# Patient Record
Sex: Female | Born: 1995
Health system: Southern US, Community
[De-identification: ages and names within clinical notes are randomized; demographics above are authoritative.]

## PROBLEM LIST (undated history)

## (undated) ENCOUNTER — Inpatient Hospital Stay (HOSPITAL_COMMUNITY): Payer: Self-pay

## (undated) DIAGNOSIS — Z789 Other specified health status: Secondary | ICD-10-CM

## (undated) DIAGNOSIS — A749 Chlamydial infection, unspecified: Secondary | ICD-10-CM

## (undated) DIAGNOSIS — I10 Essential (primary) hypertension: Secondary | ICD-10-CM

## (undated) DIAGNOSIS — O98811 Other maternal infectious and parasitic diseases complicating pregnancy, first trimester: Secondary | ICD-10-CM

## (undated) DIAGNOSIS — F17200 Nicotine dependence, unspecified, uncomplicated: Secondary | ICD-10-CM

## (undated) DIAGNOSIS — A599 Trichomoniasis, unspecified: Secondary | ICD-10-CM

## (undated) DIAGNOSIS — E669 Obesity, unspecified: Secondary | ICD-10-CM

## (undated) HISTORY — PX: WISDOM TOOTH EXTRACTION: SHX21

## (undated) HISTORY — DX: Nicotine dependence, unspecified, uncomplicated: F17.200

## (undated) HISTORY — PX: NO PAST SURGERIES: SHX2092

## (undated) HISTORY — DX: Obesity, unspecified: E66.9

---
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
## 2013-04-03 ENCOUNTER — Emergency Department (HOSPITAL_COMMUNITY): Payer: Medicaid Other

## 2013-04-03 ENCOUNTER — Emergency Department (HOSPITAL_COMMUNITY)
Admission: EM | Admit: 2013-04-03 | Discharge: 2013-04-03 | Disposition: A | Discharge status: Home / Self Care | Payer: Medicaid Other | Attending: Emergency Medicine | Admitting: Emergency Medicine

## 2013-04-03 ENCOUNTER — Encounter (HOSPITAL_COMMUNITY): Payer: Self-pay | Admitting: Emergency Medicine

## 2013-04-03 DIAGNOSIS — Z3202 Encounter for pregnancy test, result negative: Secondary | ICD-10-CM | POA: Insufficient documentation

## 2013-04-03 DIAGNOSIS — F17200 Nicotine dependence, unspecified, uncomplicated: Secondary | ICD-10-CM | POA: Insufficient documentation

## 2013-04-03 DIAGNOSIS — N760 Acute vaginitis: Secondary | ICD-10-CM | POA: Insufficient documentation

## 2013-04-03 DIAGNOSIS — R109 Unspecified abdominal pain: Secondary | ICD-10-CM

## 2013-04-03 DIAGNOSIS — E669 Obesity, unspecified: Secondary | ICD-10-CM | POA: Insufficient documentation

## 2013-04-03 DIAGNOSIS — B9689 Other specified bacterial agents as the cause of diseases classified elsewhere: Secondary | ICD-10-CM | POA: Insufficient documentation

## 2013-04-03 DIAGNOSIS — Z79899 Other long term (current) drug therapy: Secondary | ICD-10-CM | POA: Insufficient documentation

## 2013-04-03 DIAGNOSIS — A499 Bacterial infection, unspecified: Secondary | ICD-10-CM | POA: Insufficient documentation

## 2013-04-03 LAB — WET PREP, GENITAL
Trich, Wet Prep: NONE SEEN
WBC WET PREP: NONE SEEN
Yeast Wet Prep HPF POC: NONE SEEN

## 2013-04-03 LAB — URINALYSIS, ROUTINE W REFLEX MICROSCOPIC
Bilirubin Urine: NEGATIVE
Glucose, UA: NEGATIVE mg/dL
HGB URINE DIPSTICK: NEGATIVE
Ketones, ur: NEGATIVE mg/dL
Leukocytes, UA: NEGATIVE
NITRITE: NEGATIVE
PH: 7 (ref 5.0–8.0)
Protein, ur: NEGATIVE mg/dL
SPECIFIC GRAVITY, URINE: 1.022 (ref 1.005–1.030)
Urobilinogen, UA: 1 mg/dL (ref 0.0–1.0)

## 2013-04-03 LAB — COMPREHENSIVE METABOLIC PANEL
ALK PHOS: 77 U/L (ref 39–117)
ALT: 15 U/L (ref 0–35)
AST: 17 U/L (ref 0–37)
Albumin: 4.1 g/dL (ref 3.5–5.2)
BUN: 12 mg/dL (ref 6–23)
CALCIUM: 9.4 mg/dL (ref 8.4–10.5)
CHLORIDE: 103 meq/L (ref 96–112)
CO2: 25 meq/L (ref 19–32)
Creatinine, Ser: 0.64 mg/dL (ref 0.50–1.10)
GFR calc Af Amer: >90 mL/min (ref >90)
Glucose, Bld: 85 mg/dL (ref 70–99)
POTASSIUM: 4 meq/L (ref 3.7–5.3)
SODIUM: 139 meq/L (ref 137–147)
Total Bilirubin: 0.4 mg/dL (ref 0.3–1.2)
Total Protein: 7.4 g/dL (ref 6.0–8.3)

## 2013-04-03 LAB — CBC WITH DIFFERENTIAL/PLATELET
Basophils Absolute: 0 10*3/uL (ref 0.0–0.1)
Basophils Relative: 0 % (ref 0–1)
EOS PCT: 1 % (ref 0–5)
Eosinophils Absolute: 0.1 10*3/uL (ref 0.0–0.7)
HCT: 39.3 % (ref 36.0–46.0)
Hemoglobin: 13.9 g/dL (ref 12.0–15.0)
LYMPHS ABS: 2 10*3/uL (ref 0.7–4.0)
Lymphocytes Relative: 36 % (ref 12–46)
MCH: 30.2 pg (ref 26.0–34.0)
MCHC: 35.4 g/dL (ref 30.0–36.0)
MCV: 85.2 fL (ref 78.0–100.0)
Monocytes Absolute: 0.4 10*3/uL (ref 0.1–1.0)
Monocytes Relative: 7 % (ref 3–12)
Neutro Abs: 3 10*3/uL (ref 1.7–7.7)
Neutrophils Relative %: 55 % (ref 43–77)
PLATELETS: 247 10*3/uL (ref 150–400)
RBC: 4.61 MIL/uL (ref 3.87–5.11)
RDW: 13.6 % (ref 11.5–15.5)
WBC: 5.4 10*3/uL (ref 4.0–10.5)

## 2013-04-03 LAB — LIPASE, BLOOD: Lipase: 12 U/L (ref 11–59)

## 2013-04-03 LAB — PREGNANCY, URINE: Preg Test, Ur: NEGATIVE

## 2013-04-03 MED ORDER — METRONIDAZOLE 500 MG PO TABS: 500.0000 mg | ORAL_TABLET | Freq: Two times a day (BID) | ORAL | Status: DC

## 2013-04-03 MED ORDER — IBUPROFEN 800 MG PO TABS: 800.0000 mg | ORAL_TABLET | Freq: Three times a day (TID) | ORAL | Status: DC | PRN

## 2013-04-03 NOTE — Discharge Instructions (Signed)
Abdominal Pain, Women °Abdominal (stomach, pelvic, or belly) pain can be caused by many things. It is important to tell your doctor: °· The location of the pain. °· Does it come and go or is it present all the time? °· Are there things that start the pain (eating certain foods, exercise)? °· Are there other symptoms associated with the pain (fever, nausea, vomiting, diarrhea)? °All of this is helpful to know when trying to find the cause of the pain. °CAUSES  °· Stomach: virus or bacteria infection, or ulcer. °· Intestine: appendicitis (inflamed appendix), regional ileitis (Crohn's disease), ulcerative colitis (inflamed colon), irritable bowel syndrome, diverticulitis (inflamed diverticulum of the colon), or cancer of the stomach or intestine. °· Gallbladder disease or stones in the gallbladder. °· Kidney disease, kidney stones, or infection. °· Pancreas infection or cancer. °· Fibromyalgia (pain disorder). °· Diseases of the female organs: °· Uterus: fibroid (non-cancerous) tumors or infection. °· Fallopian tubes: infection or tubal pregnancy. °· Ovary: cysts or tumors. °· Pelvic adhesions (scar tissue). °· Endometriosis (uterus lining tissue growing in the pelvis and on the pelvic organs). °· Pelvic congestion syndrome (female organs filling up with blood just before the menstrual period). °· Pain with the menstrual period. °· Pain with ovulation (producing an egg). °· Pain with an IUD (intrauterine device, birth control) in the uterus. °· Cancer of the female organs. °· Functional pain (pain not caused by a disease, may improve without treatment). °· Psychological pain. °· Depression. °DIAGNOSIS  °Your doctor will decide the seriousness of your pain by doing an examination. °· Blood tests. °· X-rays. °· Ultrasound. °· CT scan (computed tomography, special type of X-ray). °· MRI (magnetic resonance imaging). °· Cultures, for infection. °· Barium enema (dye inserted in the large intestine, to better view it with  X-rays). °· Colonoscopy (looking in intestine with a lighted tube). °· Laparoscopy (minor surgery, looking in abdomen with a lighted tube). °· Major abdominal exploratory surgery (looking in abdomen with a large incision). °TREATMENT  °The treatment will depend on the cause of the pain.  °· Many cases can be observed and treated at home. °· Over-the-counter medicines recommended by your caregiver. °· Prescription medicine. °· Antibiotics, for infection. °· Birth control pills, for painful periods or for ovulation pain. °· Hormone treatment, for endometriosis. °· Nerve blocking injections. °· Physical therapy. °· Antidepressants. °· Counseling with a psychologist or psychiatrist. °· Minor or major surgery. °HOME CARE INSTRUCTIONS  °· Do not take laxatives, unless directed by your caregiver. °· Take over-the-counter pain medicine only if ordered by your caregiver. Do not take aspirin because it can cause an upset stomach or bleeding. °· Try a clear liquid diet (broth or water) as ordered by your caregiver. Slowly move to a bland diet, as tolerated, if the pain is related to the stomach or intestine. °· Have a thermometer and take your temperature several times a day, and record it. °· Bed rest and sleep, if it helps the pain. °· Avoid sexual intercourse, if it causes pain. °· Avoid stressful situations. °· Keep your follow-up appointments and tests, as your caregiver orders. °· If the pain does not go away with medicine or surgery, you may try: °· Acupuncture. °· Relaxation exercises (yoga, meditation). °· Group therapy. °· Counseling. °SEEK MEDICAL CARE IF:  °· You notice certain foods cause stomach pain. °· Your home care treatment is not helping your pain. °· You need stronger pain medicine. °· You want your IUD removed. °· You feel faint or   lightheaded. °· You develop nausea and vomiting. °· You develop a rash. °· You are having side effects or an allergy to your medicine. °SEEK IMMEDIATE MEDICAL CARE IF:  °· Your  pain does not go away or gets worse. °· You have a fever. °· Your pain is felt only in portions of the abdomen. The right side could possibly be appendicitis. The left lower portion of the abdomen could be colitis or diverticulitis. °· You are passing blood in your stools (bright red or black tarry stools, with or without vomiting). °· You have blood in your urine. °· You develop chills, with or without a fever. °· You pass out. °MAKE SURE YOU:  °· Understand these instructions. °· Will watch your condition. °· Will get help right away if you are not doing well or get worse. °Document Released: 10/19/2006 Document Revised: 03/16/2011 Document Reviewed: 11/08/2008 °ExitCare® Patient Information ©2014 [employer]. ° °Bacterial Vaginosis °Bacterial vaginosis is a vaginal infection that occurs when the normal balance of bacteria in the vagina is disrupted. It results from an overgrowth of certain bacteria. This is the most common vaginal infection in women of childbearing age. Treatment is important to prevent complications, especially in pregnant women, as it can cause a premature delivery. °CAUSES  °Bacterial vaginosis is caused by an increase in harmful bacteria that are normally present in smaller amounts in the vagina. Several different kinds of bacteria can cause bacterial vaginosis. However, the reason that the condition develops is not fully understood. °RISK FACTORS °Certain activities or behaviors can put you at an increased risk of developing bacterial vaginosis, including: °· Having a new sex partner or multiple sex partners. °· Douching. °· Using an intrauterine device (IUD) for contraception. °Women do not get bacterial vaginosis from toilet seats, bedding, swimming pools, or contact with objects around them. °SIGNS AND SYMPTOMS  °Some women with bacterial vaginosis have no signs or symptoms. Common symptoms include: °· Grey vaginal discharge. °· A fishlike odor with discharge, especially after sexual  intercourse. °· Itching or burning of the vagina and vulva. °· Burning or pain with urination. °DIAGNOSIS  °Your health care provider will take a medical history and examine the vagina for signs of bacterial vaginosis. A sample of vaginal fluid may be taken. Your health care provider will look at this sample under a microscope to check for bacteria and abnormal cells. A vaginal pH test may also be done.  °TREATMENT  °Bacterial vaginosis may be treated with antibiotic medicines. These may be given in the form of a pill or a vaginal cream. A second round of antibiotics may be prescribed if the condition comes back after treatment.  °HOME CARE INSTRUCTIONS  °· Only take over-the-counter or prescription medicines as directed by your health care provider. °· If antibiotic medicine was prescribed, take it as directed. Make sure you finish it even if you start to feel better. °· Do not have sex until treatment is completed. °· Tell all sexual partners that you have a vaginal infection. They should see their health care provider and be treated if they have problems, such as a mild rash or itching. °· Practice safe sex by using condoms and only having one sex partner. °SEEK MEDICAL CARE IF:  °· Your symptoms are not improving after 3 days of treatment. °· You have increased discharge or pain. °· You have a fever. °MAKE SURE YOU:  °· Understand these instructions. °· Will watch your condition. °· Will get help right away if you   are not doing well or get worse. °FOR MORE INFORMATION  °Centers for Disease Control and Prevention, Division of STD Prevention: www.cdc.gov/std °American Sexual Health Association (ASHA): www.ashastd.org  °Document Released: 12/22/2004 Document Revised: 10/12/2012 Document Reviewed: 08/03/2012 °ExitCare® Patient Information ©2014 [employer]. ° °

## 2013-04-03 NOTE — ED Notes (Signed)
Pt transported to [facility]

## 2013-04-03 NOTE — ED Notes (Signed)
Pt c/o abd pain x 2 wks; increased pain right lower abd; no fever; vomited x 2 yesterday

## 2013-04-03 NOTE — ED Provider Notes (Signed)
TIME SEEN: 3:34 PM  CHIEF COMPLAINT: Abdominal pain, vomiting  HPI: Patient is a 18 year old female with no significant past medical history who presents to the emergency department with complaints of intermittent right-sided lower sharp abdominal pains for the past 2 weeks. She denies any radiation of the pain. It is worse with palpation and better with rest. She had 2 episodes of nonbloody, nonbilious vomiting yesterday. No diarrhea. No bloody stool or melena. No vaginal bleeding or discharge. She is having some dysuria. No urinary frequency or urgency or hematuria. She has never had similar symptoms in the past. She is sexually active with one partner and they occasionally use condoms. Denies a history of STI's. No history of ovarian cysts. No prior history of abdominal surgery. No sick contacts or recent travel, hospitalization or antibiotic use. LMP was 2 weeks ago.  ROS: See HPI Constitutional: no fever  Eyes: no drainage  ENT: no runny nose   Cardiovascular:  no chest pain  Resp: no SOB  GI: no vomiting GU: no dysuria Integumentary: no rash  Allergy: no hives  Musculoskeletal: no leg swelling  Neurological: no slurred speech ROS otherwise negative  PAST MEDICAL HISTORY/PAST SURGICAL HISTORY:  Past Medical History  Diagnosis Date  . Mild obesity   . Smoker     MEDICATIONS:  Prior to Admission medications   Medication Sig Start Date End Date Taking? Authorizing Provider  ibuprofen (ADVIL,MOTRIN) 200 MG tablet Take 400 mg by mouth every 6 (six) hours as needed for moderate pain.   Yes Historical Provider, MD  Prenatal Vit-Fe Fumarate-FA (PRENATAL MULTIVITAMIN) TABS tablet Take 1 tablet by mouth daily at 12 noon.   Yes Historical Provider, MD    ALLERGIES:  No Known Allergies  SOCIAL HISTORY:  History  Substance Use Topics  . Smoking status: Current Every Day Smoker -- 0.50 packs/day    Types: Cigarettes  . Smokeless tobacco: Never Used  . Alcohol Use: Yes    FAMILY  HISTORY: Family History  Problem Relation Age of Onset  . Asthma Brother     EXAM: BP 140/84  Pulse 72  Temp(Src) 98.3 F (36.8 C) (Oral)  Resp 20  SpO2 100%  LMP 03/20/2013 CONSTITUTIONAL: Alert and oriented and responds appropriately to questions. Well-appearing; well-nourished, no apparent distress HEAD: Normocephalic EYES: Conjunctivae clear, PERRL ENT: normal nose; no rhinorrhea; moist mucous membranes; pharynx without lesions noted NECK: Supple, no meningismus, no LAD  CARD: RRR; S1 and S2 appreciated; no murmurs, no clicks, no rubs, no gallops RESP: Normal chest excursion without splinting or tachypnea; breath sounds clear and equal bilaterally; no wheezes, no rhonchi, no rales,  ABD/GI: Normal bowel sounds; non-distended; soft, tender to palpation in the right pelvic region with mild voluntary guarding, no rebound or peritoneal signs, negative Murphy sign, patient is not tender at McBurney's point GU:  Normal external genitalia, cervix is thick and closed and high, minimal white vaginal discharge, patient has right adnexal tenderness without fullness and no mass, no cervical motion tenderness, left adnexa is nontender to palpation; no vaginal bleeding BACK:  The back appears normal and is non-tender to palpation, there is no CVA tenderness EXT: Normal ROM in all joints; non-tender to palpation; no edema; normal capillary refill; no cyanosis    SKIN: Normal color for age and race; warm NEURO: Moves all extremities equally PSYCH: The patient's mood and manner are appropriate. Grooming and personal hygiene are appropriate.  MEDICAL DECISION MAKING: Patient here with right-sided pelvic pain. Suspect ovarian cyst versus ectopic,  torsion, TOA. Will obtain labs, urine, pelvic with cultures and transvaginal ultrasound. We'll give pain and nausea medication. I am not concerned for appendicitis given patient has not had any fevers and she has had pain for 2 weeks  ED PROGRESS: Patient's  labs, urine are unremarkable. Pregnancy test negative. Her wet prep is positive for clue cells. We'll treat her bacterial vaginosis with Flagyl. Her ultrasound shows normal Doppler flow to both ovaries with no abnormality. Upon reassessment of the patient she is in the room laughing and asking for something to eat. Her friend is now at her bedside was also a patient that was checked in earlier today. Patient is requesting a work note for today and yesterday. Have discussed return precautions with patient and supportive care instructions. Patient verbalizes understanding and is comfortable with plan.     Layla MawKristen N Ward, DO 04/03/13 [phone]

## 2013-04-04 LAB — GC/CHLAMYDIA PROBE AMP
CT Probe RNA: NEGATIVE
GC PROBE AMP APTIMA: NEGATIVE

## 2013-04-06 ENCOUNTER — Encounter (HOSPITAL_COMMUNITY): Payer: Self-pay | Admitting: Emergency Medicine

## 2013-04-06 ENCOUNTER — Emergency Department (HOSPITAL_COMMUNITY)
Admission: EM | Admit: 2013-04-06 | Discharge: 2013-04-06 | Disposition: A | Discharge status: Home / Self Care | Payer: Medicaid Other | Attending: Emergency Medicine | Admitting: Emergency Medicine

## 2013-04-06 DIAGNOSIS — Z8619 Personal history of other infectious and parasitic diseases: Secondary | ICD-10-CM | POA: Insufficient documentation

## 2013-04-06 DIAGNOSIS — Z79899 Other long term (current) drug therapy: Secondary | ICD-10-CM | POA: Insufficient documentation

## 2013-04-06 DIAGNOSIS — Z760 Encounter for issue of repeat prescription: Secondary | ICD-10-CM | POA: Insufficient documentation

## 2013-04-06 DIAGNOSIS — R109 Unspecified abdominal pain: Secondary | ICD-10-CM | POA: Insufficient documentation

## 2013-04-06 DIAGNOSIS — Z8742 Personal history of other diseases of the female genital tract: Secondary | ICD-10-CM | POA: Insufficient documentation

## 2013-04-06 DIAGNOSIS — F17200 Nicotine dependence, unspecified, uncomplicated: Secondary | ICD-10-CM | POA: Insufficient documentation

## 2013-04-06 MED ORDER — IBUPROFEN 800 MG PO TABS
800.0000 mg | ORAL_TABLET | Freq: Once | ORAL | Status: AC
  Administered 2013-04-06: 800 mg via ORAL
  Filled 2013-04-06: qty 1

## 2013-04-06 MED ORDER — METRONIDAZOLE 500 MG PO TABS
500.0000 mg | ORAL_TABLET | Freq: Once | ORAL | Status: AC
  Administered 2013-04-06: 500 mg via ORAL
  Filled 2013-04-06: qty 1

## 2013-04-06 NOTE — ED Notes (Signed)
Pt asking for work note to be out of work for 2 more days.  Pt was dx with bacterial vaginitis recently.

## 2013-04-06 NOTE — Progress Notes (Signed)
[facility] provided pt with a list of primary care resources to help patient establish primary care.  °

## 2013-04-06 NOTE — ED Provider Notes (Signed)
CSN: [id number]     Arrival date & time 04/06/13  1433 History  This chart was scribed for non-physician practitioner Roxy Horseman, PA-C, working with Juliet Rude. Rubin Payor, MD, by Yevette Edwards, ED Scribe. This patient was seen in room WTR6/WTR6 and the patient's care was started at 4:06 PM.   First MD Initiated Contact with Patient 04/06/13 1456     Chief Complaint  Patient presents with  . work note     The history is provided by the patient. No language interpreter was used.   HPI Comments: Patricia Warren is a 18 y.o. female who presents to the Emergency Department with the chief complaint of abdominal pain.  Two days ago, she was diagnosed with bacterial vaginitis after experiencing intermittent abdominal pain for two weeks.  She cannot fill the prescriptions prescribed two days ago until tomorrow due to finances. She has continued to experience abdominal pain; the pain has been waxing and waning but remains similar to the pain she has experienced for two weeks. Yesterday evening she experienced an episode of non-bloody non-bilious diarrhea; she also experienced an episode of diarrhea this morning. She denies vaginal discharge and a fever. In the ED her temperature is 98.4 F.   Past Medical History  Diagnosis Date  . Mild obesity   . Smoker    History reviewed. No pertinent past surgical history. Family History  Problem Relation Age of Onset  . Asthma Brother    History  Substance Use Topics  . Smoking status: Current Every Day Smoker -- 0.50 packs/day    Types: Cigarettes  . Smokeless tobacco: Never Used  . Alcohol Use: Yes   No OB history provided.  Review of Systems  Constitutional: Negative for fever and chills.  Respiratory: Negative for shortness of breath.   Cardiovascular: Negative for chest pain.  Gastrointestinal: Negative for nausea, vomiting, diarrhea and constipation.  Genitourinary: Negative for dysuria.      Allergies  Review of patient's allergies  indicates no known allergies.  Home Medications   Current Outpatient Rx  Name  Route  Sig  Dispense  Refill  . ibuprofen (ADVIL,MOTRIN) 200 MG tablet   Oral   Take 400 mg by mouth every 6 (six) hours as needed for moderate pain.         . Prenatal Vit-Fe Fumarate-FA (PRENATAL MULTIVITAMIN) TABS tablet   Oral   Take 1 tablet by mouth daily at 12 noon.         Marland Kitchen ibuprofen (ADVIL,MOTRIN) 800 MG tablet   Oral   Take 1 tablet (800 mg total) by mouth every 8 (eight) hours as needed (pain).   30 tablet   0   . metroNIDAZOLE (FLAGYL) 500 MG tablet   Oral   Take 1 tablet (500 mg total) by mouth 2 (two) times daily. Do not drink alcohol with this medication.   14 tablet   0    Triage Vitals: BP 121/74  Pulse 90  Temp(Src) 98.4 F (36.9 C) (Oral)  Resp 18  SpO2 97%  LMP 03/20/2013  Physical Exam  Nursing note and vitals reviewed. Constitutional: She is oriented to person, place, and time. She appears well-developed and well-nourished. No distress.  HENT:  Head: Normocephalic and atraumatic.  Eyes: EOM are normal.  Neck: Neck supple. No tracheal deviation present.  Cardiovascular: Normal rate and regular rhythm.   Pulmonary/Chest: Effort normal and breath sounds normal. No respiratory distress.  Abdominal: Soft. Bowel sounds are normal. She exhibits no distension and no  mass. There is no tenderness. There is no rebound and no guarding.  No focal abdominal tenderness, no RLQ tenderness or pain at McBurney's point, no RUQ tenderness or Murphy's sign, no left-sided abdominal tenderness, no fluid wave, or signs of peritonitis   Musculoskeletal: Normal range of motion.  Neurological: She is alert and oriented to person, place, and time.  Skin: Skin is warm and dry.  Psychiatric: She has a normal mood and affect. Her behavior is normal.    ED Course  Procedures (including critical care time)  DIAGNOSTIC STUDIES: Oxygen Saturation is 97% on room air, normal by my  interpretation.    COORDINATION OF CARE:  4:12 PM- Discussed treatment plan with patient, and the patient agreed to the plan. The plan includes medication. Informed pt to return to ED if she develops a high fever, hematemesis, or hematochezia.   Labs Review Labs Reviewed - No data to display Imaging Review No results found.   EKG Interpretation None      MDM   Final diagnoses:  Medication refill    Patient was seen here 2 days ago for the same complaint.  She was diagnosed with BV.  Still having symptoms.  She is requesting a work note because her symptoms have not resolved.  I told her that she needs to fill her medicine.  I will give her a dose here, because she says she can't fill the medicine until tomorrow.  Will give her a work note for today.  Discharge to home in good condition.  NAD.  Abdomen is benign.  I doubt acute or surgical abdomen.  Filed Vitals:   04/06/13 1448  BP: 121/74  Pulse: 90  Temp: 98.4 F (36.9 C)  Resp: 18   I personally performed the services described in this documentation, which was scribed in my presence. The recorded information has been reviewed and is accurate.      Roxy Horsemanobert Hadyn Blanck, PA-C 04/06/13 1621

## 2013-04-06 NOTE — Discharge Instructions (Signed)
Medication Refill, Emergency Department  We have refilled your medication today as a courtesy to you. It is best for your medical care, however, to take care of getting refills done through your primary caregiver's office. They have your records and can do a better job of follow-up than we can in the emergency department.  On maintenance medications, we often only prescribe enough medications to get you by until you are able to see your regular caregiver. This is a more expensive way to refill medications.  In the future, please plan for refills so that you will not have to use the emergency department for this.  Thank you for your help. Your help allows us to better take care of the daily emergencies that enter our department.  Document Released: 04/10/2003 Document Revised: 03/16/2011 Document Reviewed: 12/22/2004  [employer]® Patient Information ©2014 [employer].

## 2013-04-07 NOTE — ED Provider Notes (Signed)
Medical screening examination/treatment/procedure(s) were performed by non-physician practitioner and as supervising physician I was immediately available for consultation/collaboration.     Emmy Keng M Brison Fiumara, MD 04/07/13 1403 

## 2013-09-02 ENCOUNTER — Encounter (HOSPITAL_COMMUNITY): Payer: Self-pay | Admitting: Emergency Medicine

## 2013-09-02 ENCOUNTER — Emergency Department (HOSPITAL_COMMUNITY)
Admission: EM | Admit: 2013-09-02 | Discharge: 2013-09-02 | Disposition: A | Discharge status: Home / Self Care | Payer: Medicaid Other | Attending: Emergency Medicine | Admitting: Emergency Medicine

## 2013-09-02 DIAGNOSIS — E669 Obesity, unspecified: Secondary | ICD-10-CM | POA: Insufficient documentation

## 2013-09-02 DIAGNOSIS — F17200 Nicotine dependence, unspecified, uncomplicated: Secondary | ICD-10-CM | POA: Insufficient documentation

## 2013-09-02 DIAGNOSIS — R339 Retention of urine, unspecified: Secondary | ICD-10-CM | POA: Insufficient documentation

## 2013-09-02 DIAGNOSIS — M549 Dorsalgia, unspecified: Secondary | ICD-10-CM | POA: Insufficient documentation

## 2013-09-02 DIAGNOSIS — R350 Frequency of micturition: Secondary | ICD-10-CM | POA: Insufficient documentation

## 2013-09-02 DIAGNOSIS — R300 Dysuria: Secondary | ICD-10-CM

## 2013-09-02 LAB — URINALYSIS, ROUTINE W REFLEX MICROSCOPIC
Bilirubin Urine: NEGATIVE
Glucose, UA: NEGATIVE mg/dL
Hgb urine dipstick: NEGATIVE
Ketones, ur: NEGATIVE mg/dL
NITRITE: NEGATIVE
PROTEIN: NEGATIVE mg/dL
SPECIFIC GRAVITY, URINE: 1.027 (ref 1.005–1.030)
Urobilinogen, UA: 1 mg/dL (ref 0.0–1.0)
pH: 7 (ref 5.0–8.0)

## 2013-09-02 LAB — WET PREP, GENITAL
CLUE CELLS WET PREP: NONE SEEN
Trich, Wet Prep: NONE SEEN
WBC, Wet Prep HPF POC: NONE SEEN
Yeast Wet Prep HPF POC: NONE SEEN

## 2013-09-02 LAB — CBC WITH DIFFERENTIAL/PLATELET
BASOS ABS: 0 10*3/uL (ref 0.0–0.1)
BASOS PCT: 0 % (ref 0–1)
EOS ABS: 0.1 10*3/uL (ref 0.0–0.7)
EOS PCT: 2 % (ref 0–5)
HEMATOCRIT: 37.2 % (ref 36.0–46.0)
Hemoglobin: 12.8 g/dL (ref 12.0–15.0)
LYMPHS PCT: 34 % (ref 12–46)
Lymphs Abs: 2 10*3/uL (ref 0.7–4.0)
MCH: 30 pg (ref 26.0–34.0)
MCHC: 34.4 g/dL (ref 30.0–36.0)
MCV: 87.3 fL (ref 78.0–100.0)
MONO ABS: 0.4 10*3/uL (ref 0.1–1.0)
Monocytes Relative: 7 % (ref 3–12)
Neutro Abs: 3.3 10*3/uL (ref 1.7–7.7)
Neutrophils Relative %: 57 % (ref 43–77)
PLATELETS: 218 10*3/uL (ref 150–400)
RBC: 4.26 MIL/uL (ref 3.87–5.11)
RDW: 14 % (ref 11.5–15.5)
WBC: 5.8 10*3/uL (ref 4.0–10.5)

## 2013-09-02 LAB — BASIC METABOLIC PANEL
Anion gap: 13 (ref 5–15)
BUN: 13 mg/dL (ref 6–23)
CALCIUM: 9 mg/dL (ref 8.4–10.5)
CO2: 22 meq/L (ref 19–32)
CREATININE: 0.74 mg/dL (ref 0.50–1.10)
Chloride: 104 mEq/L (ref 96–112)
GFR calc non Af Amer: >90 mL/min (ref >90)
Glucose, Bld: 97 mg/dL (ref 70–99)
Potassium: 4.1 mEq/L (ref 3.7–5.3)
SODIUM: 139 meq/L (ref 137–147)

## 2013-09-02 LAB — URINE MICROSCOPIC-ADD ON

## 2013-09-02 MED ORDER — PHENAZOPYRIDINE HCL 95 MG PO TABS: 95.0000 mg | ORAL_TABLET | Freq: Three times a day (TID) | ORAL | Status: DC | PRN

## 2013-09-02 MED ORDER — SODIUM CHLORIDE 0.9 % IV BOLUS (SEPSIS)
1000.0000 mL | Freq: Once | INTRAVENOUS | Status: AC
  Administered 2013-09-02: 1000 mL via INTRAVENOUS

## 2013-09-02 NOTE — ED Provider Notes (Signed)
CSN: [id number]     Arrival date & time 09/02/13  1358 History   First MD Initiated Contact with Patient 09/02/13 1548     Chief Complaint  Patient presents with  . Urinary Retention  . Back Pain     (Consider location/radiation/quality/duration/timing/severity/associated sxs/prior Treatment) HPI Comments: Patient presents to the ED with a chief complaint of urinary frequency, and dysuria.  She states that she has been having the symptoms for the past week.  She describes the pain as sharp.  She states that she has had a bladder infection in the past and this feel similar.  She reports associated cramps and low-back pain.  She denies any fevers, chills, nausea, vomiting, or vaginal discharge.  She denies bowel or bladder incontinence.  She states that it feels like the volume of urine that she produces with each attempt is decreasing and the pain is worsening.  Additionally she states that she has had a sore throat since last night.  She has not taken anything to alleviate her symptoms.  There are no aggravating or alleviating factors.  The history is provided by the patient. No language interpreter was used.    Past Medical History  Diagnosis Date  . Mild obesity   . Smoker    History reviewed. No pertinent past surgical history. Family History  Problem Relation Age of Onset  . Asthma Brother    History  Substance Use Topics  . Smoking status: Current Every Day Smoker -- 0.50 packs/day    Types: Cigarettes  . Smokeless tobacco: Never Used  . Alcohol Use: Yes   OB History   Grav Para Term Preterm Abortions TAB SAB Ect Mult Living                 Review of Systems  Constitutional: Negative for fever and chills.  Gastrointestinal: Negative for nausea, vomiting, abdominal pain and diarrhea.  Genitourinary: Positive for dysuria, frequency and decreased urine volume. Negative for vaginal bleeding and vaginal discharge.  Musculoskeletal: Positive for back pain.  All other systems  reviewed and are negative.     Allergies  Review of patient's allergies indicates no known allergies.  Home Medications   Prior to Admission medications   Medication Sig Start Date End Date Taking? Authorizing Provider  ibuprofen (ADVIL,MOTRIN) 200 MG tablet Take 400 mg by mouth every 6 (six) hours as needed for moderate pain.   Yes Historical Provider, MD   BP 112/61  Pulse 74  Temp(Src) 98.5 F (36.9 C) (Oral)  Resp 17  SpO2 100%  LMP 08/31/2013 Physical Exam  Nursing note and vitals reviewed. Constitutional: She is oriented to person, place, and time. She appears well-developed and well-nourished.  HENT:  Head: Normocephalic and atraumatic.  Eyes: Conjunctivae and EOM are normal. Pupils are equal, round, and reactive to light.  Neck: Normal range of motion. Neck supple.  Cardiovascular: Normal rate and regular rhythm.  Exam reveals no gallop and no friction rub.   No murmur heard. Pulmonary/Chest: Effort normal and breath sounds normal. No respiratory distress. She has no wheezes. She has no rales. She exhibits no tenderness.  Abdominal: Soft. She exhibits no distension and no mass. There is no tenderness. There is no rebound and no guarding.  No focal abdominal tenderness, no RLQ tenderness or pain at McBurney's point, no RUQ tenderness or Murphy's sign, no left-sided abdominal tenderness, no fluid wave, or signs of peritonitis   Genitourinary:  Pelvic exam chaperoned by female ER tech, no right or  left adnexal tenderness, no uterine tenderness, no vaginal discharge, no bleeding, no CMT or friability, no foreign body, no injury to the external genitalia, no other significant findings    Musculoskeletal: Normal range of motion. She exhibits no edema and no tenderness.  Lumbar spine ttp, no CVA tenderness  Neurological: She is alert and oriented to person, place, and time.  Normal LE reflexes, sensation and strength of LEs is 5/5  Skin: Skin is warm and dry.  Psychiatric:  She has a normal mood and affect. Her behavior is normal. Judgment and thought content normal.    ED Course  Procedures (including critical care time) Results for orders placed during the hospital encounter of 09/02/13  WET PREP, GENITAL      Result Value Ref Range   Yeast Wet Prep HPF POC NONE SEEN  NONE SEEN   Trich, Wet Prep NONE SEEN  NONE SEEN   Clue Cells Wet Prep HPF POC NONE SEEN  NONE SEEN   WBC, Wet Prep HPF POC NONE SEEN  NONE SEEN  URINALYSIS, ROUTINE W REFLEX MICROSCOPIC      Result Value Ref Range   Color, Urine YELLOW  YELLOW   APPearance CLOUDY (*) CLEAR   Specific Gravity, Urine 1.027  1.005 - 1.030   pH 7.0  5.0 - 8.0   Glucose, UA NEGATIVE  NEGATIVE mg/dL   Hgb urine dipstick NEGATIVE  NEGATIVE   Bilirubin Urine NEGATIVE  NEGATIVE   Ketones, ur NEGATIVE  NEGATIVE mg/dL   Protein, ur NEGATIVE  NEGATIVE mg/dL   Urobilinogen, UA 1.0  0.0 - 1.0 mg/dL   Nitrite NEGATIVE  NEGATIVE   Leukocytes, UA SMALL (*) NEGATIVE  CBC WITH DIFFERENTIAL      Result Value Ref Range   WBC 5.8  4.0 - 10.5 K/uL   RBC 4.26  3.87 - 5.11 MIL/uL   Hemoglobin 12.8  12.0 - 15.0 g/dL   HCT 47.8  29.5 - 62.1 %   MCV 87.3  78.0 - 100.0 fL   MCH 30.0  26.0 - 34.0 pg   MCHC 34.4  30.0 - 36.0 g/dL   RDW 30.8  65.7 - 84.6 %   Platelets 218  150 - 400 K/uL   Neutrophils Relative % 57  43 - 77 %   Neutro Abs 3.3  1.7 - 7.7 K/uL   Lymphocytes Relative 34  12 - 46 %   Lymphs Abs 2.0  0.7 - 4.0 K/uL   Monocytes Relative 7  3 - 12 %   Monocytes Absolute 0.4  0.1 - 1.0 K/uL   Eosinophils Relative 2  0 - 5 %   Eosinophils Absolute 0.1  0.0 - 0.7 K/uL   Basophils Relative 0  0 - 1 %   Basophils Absolute 0.0  0.0 - 0.1 K/uL  BASIC METABOLIC PANEL      Result Value Ref Range   Sodium 139  137 - 147 mEq/L   Potassium 4.1  3.7 - 5.3 mEq/L   Chloride 104  96 - 112 mEq/L   CO2 22  19 - 32 mEq/L   Glucose, Bld 97  70 - 99 mg/dL   BUN 13  6 - 23 mg/dL   Creatinine, Ser 9.62  0.50 - 1.10 mg/dL    Calcium 9.0  8.4 - 95.2 mg/dL   GFR calc non Af Amer >90  >90 mL/min   GFR calc Af Amer >90  >90 mL/min   Anion gap 13  5 -  15  URINE MICROSCOPIC-ADD ON      Result Value Ref Range   Squamous Epithelial / LPF MANY (*) RARE   WBC, UA 11-20  <3 WBC/hpf   Bacteria, UA FEW (*) RARE   Urine-Other MUCOUS PRESENT     No results found.    EKG Interpretation None      MDM   Final diagnoses:  Dysuria    Patient with urinary frequency and dysuria.  Also complains of low-back pain.  Check labs and UA.  Will also check bladder scan pre and post-void to rule out retention.   Patient with urinary frequency and dysuria. UA is unremarkable. Will perform pelvic exam. Patient discussed with Dr. Freida Busman.  Patient is not retaining urine. Postvoid residual is 68 mL.  Pelvic exam unremarkable. Patient states that palpation of uterus makes her want to urinate.  Discussed the patient with Dr. Freida Busman, no open sores, no obvious abnormality on pelvic, no evidence of infection, will treat with pyridium.  DC to home with PCP follow-up.  Roxy Horseman, PA-C 09/02/13 2025

## 2013-09-02 NOTE — ED Notes (Addendum)
Pt from home c/o difficulty with urination x1 week. Pt reports a hx of the same and was dx with UTI. Pt denies dysuria, but sts she is having 'bladder cramps". Pt denies N/V, fever. Pt sts that she has had unprotected sex.  Pt adds that she has a sore throat that started this am. Pt is A&O and in NAD

## 2013-09-02 NOTE — ED Notes (Signed)
Pelvic supplies at bedside. Pt undressed waist down.

## 2013-09-02 NOTE — Discharge Instructions (Signed)

## 2013-09-04 LAB — POC URINE PREG, ED: Preg Test, Ur: NEGATIVE

## 2013-09-05 LAB — GC/CHLAMYDIA PROBE AMP
CT PROBE, AMP APTIMA: NEGATIVE
GC Probe RNA: NEGATIVE

## 2013-09-05 NOTE — ED Provider Notes (Signed)
Medical screening examination/treatment/procedure(s) were performed by non-physician practitioner and as supervising physician I was immediately available for consultation/collaboration.  Nashaly Dorantes T Nairi Oswald, MD 09/05/13 1725 

## 2013-10-09 ENCOUNTER — Emergency Department (HOSPITAL_COMMUNITY)
Admission: EM | Admit: 2013-10-09 | Discharge: 2013-10-09 | Disposition: A | Discharge status: Home / Self Care | Payer: Medicaid Other | Attending: Emergency Medicine | Admitting: Emergency Medicine

## 2013-10-09 ENCOUNTER — Encounter (HOSPITAL_COMMUNITY): Payer: Self-pay | Admitting: Emergency Medicine

## 2013-10-09 DIAGNOSIS — S300XXA Contusion of lower back and pelvis, initial encounter: Secondary | ICD-10-CM | POA: Insufficient documentation

## 2013-10-09 DIAGNOSIS — Z720 Tobacco use: Secondary | ICD-10-CM | POA: Insufficient documentation

## 2013-10-09 DIAGNOSIS — S301XXA Contusion of abdominal wall, initial encounter: Secondary | ICD-10-CM | POA: Insufficient documentation

## 2013-10-09 DIAGNOSIS — E669 Obesity, unspecified: Secondary | ICD-10-CM | POA: Insufficient documentation

## 2013-10-09 MED ORDER — HYDROCODONE-ACETAMINOPHEN 5-325 MG PO TABS
1.0000 | ORAL_TABLET | Freq: Once | ORAL | Status: AC
  Administered 2013-10-09: 1 via ORAL
  Filled 2013-10-09: qty 1

## 2013-10-09 MED ORDER — IBUPROFEN 400 MG PO TABS
400.0000 mg | ORAL_TABLET | Freq: Once | ORAL | Status: AC
  Administered 2013-10-09: 400 mg via ORAL
  Filled 2013-10-09: qty 1

## 2013-10-09 MED ORDER — TRAMADOL HCL 50 MG PO TABS: 50.0000 mg | ORAL_TABLET | Freq: Four times a day (QID) | ORAL | Status: DC | PRN

## 2013-10-09 NOTE — Discharge Instructions (Signed)
It was our pleasure to provide your ER care today - we hope that you feel better.  Take motrin or aleve as need for pain. You may also try ultram as need for pain - no driving when taking ultram. Follow up with primary care doctor in 1 week if symptoms fail to improve/resolve.  You were given pain medication in the ER - no driving for the next 4 hours.  Return to ER if worse, new symptoms, severe pain, other concern.     Contusion A contusion is a deep bruise. Contusions happen when an injury causes bleeding under the skin. Signs of bruising include pain, puffiness (swelling), and discolored skin. The contusion may turn blue, purple, or yellow. HOME CARE   Put ice on the injured area.  Put ice in a plastic bag.  Place a towel between your skin and the bag.  Leave the ice on for 15-20 minutes, 03-04 times a day.  Only take medicine as told by your doctor.  Rest the injured area.  If possible, raise (elevate) the injured area to lessen puffiness. GET HELP RIGHT AWAY IF:   You have more bruising or puffiness.  You have pain that is getting worse.  Your puffiness or pain is not helped by medicine. MAKE SURE YOU:   Understand these instructions.  Will watch your condition.  Will get help right away if you are not doing well or get worse. Document Released: 06/10/2007 Document Revised: 03/16/2011 Document Reviewed: 10/27/2010 [facility] Patient Information 2015 [place], [place]. This information is not intended to replace advice given to you by your health care provider. Make sure you discuss any questions you have with your health care provider.     Assault, General Assault includes any behavior, whether intentional or reckless, which results in bodily injury to another person and/or damage to property. Included in this would be any behavior, intentional or reckless, that by its nature would be understood (interpreted) by a reasonable person as intent to harm another person  or to damage his/her property. Threats may be oral or written. They may be communicated through regular mail, computer, fax, or phone. These threats may be direct or implied. FORMS OF ASSAULT INCLUDE:  Physically assaulting a person. This includes physical threats to inflict physical harm as well as:  Slapping.  Hitting.  Poking.  Kicking.  Punching.  Pushing.  Arson.  Sabotage.  Equipment vandalism.  Damaging or destroying property.  Throwing or hitting objects.  Displaying a weapon or an object that appears to be a weapon in a threatening manner.  Carrying a firearm of any kind.  Using a weapon to harm someone.  Using greater physical size/strength to intimidate another.  Making intimidating or threatening gestures.  Bullying.  Hazing.  Intimidating, threatening, hostile, or abusive language directed toward another person.  It communicates the intention to engage in violence against that person. And it leads a reasonable person to expect that violent behavior may occur.  Stalking another person. IF IT HAPPENS AGAIN:  Immediately call for emergency help (911 in [place]  If someone poses clear and immediate danger to you, seek legal authorities to have a protective or restraining order put in place.  Less threatening assaults can at least be reported to authorities. STEPS TO TAKE IF A SEXUAL ASSAULT HAS HAPPENED  Go to an area of safety. This may include a shelter or staying with a friend. Stay away from the area where you have been attacked. A large percentage of sexual  assaults are caused by a friend, relative or associate.  If medications were given by your caregiver, take them as directed for the full length of time prescribed.  Only take over-the-counter or prescription medicines for pain, discomfort, or fever as directed by your caregiver.  If you have come in contact with a sexual disease, find out if you are to be tested again. If your caregiver is  concerned about the HIV/AIDS virus, he/she may require you to have continued testing for several months.  For the protection of your privacy, test results can not be given over the phone. Make sure you receive the results of your test. If your test results are not back during your visit, make an appointment with your caregiver to find out the results. Do not assume everything is normal if you have not heard from your caregiver or the medical facility. It is important for you to follow up on all of your test results.  File appropriate papers with authorities. This is important in all assaults, even if it has occurred in a family or by a friend. SEEK MEDICAL CARE IF:  You have new problems because of your injuries.  You have problems that may be because of the medicine you are taking, such as:  Rash.  Itching.  Swelling.  Trouble breathing.  You develop belly (abdominal) pain, feel sick to your stomach (nausea) or are vomiting.  You begin to run a temperature.  You need supportive care or referral to a rape crisis center. These are centers with trained personnel who can help you get through this ordeal. SEEK IMMEDIATE MEDICAL CARE IF:  You are afraid of being threatened, beaten, or abused. In [place] call 911.  You receive new injuries related to abuse.  You develop severe pain in any area injured in the assault or have any change in your condition that concerns you.  You faint or lose consciousness.  You develop chest pain or shortness of breath. Document Released: 12/22/2004 Document Revised: 03/16/2011 Document Reviewed: 08/10/2007 [facility] Patient Information 2015 [place], [place]. This information is not intended to replace advice given to you by your health care provider. Make sure you discuss any questions you have with your health care provider.     Blunt Trauma You have been evaluated for injuries. You have been examined and your caregiver has not found injuries serious  enough to require hospitalization. It is common to have multiple bruises and sore muscles following an accident. These tend to feel worse for the first 24 hours. You will feel more stiffness and soreness over the next several hours and worse when you wake up the first morning after your accident. After this point, you should begin to improve with each passing day. The amount of improvement depends on the amount of damage done in the accident. Following your accident, if some part of your body does not work as it should, or if the pain in any area continues to increase, you should return to the Emergency Department for re-evaluation.  HOME CARE INSTRUCTIONS  Routine care for sore areas should include:  Ice to sore areas every 2 hours for 20 minutes while awake for the next 2 days.  Drink extra fluids (not alcohol).  Take a hot or warm shower or bath once or twice a day to increase blood flow to sore muscles. This will help you "limber up".  Activity as tolerated. Lifting may aggravate neck or back pain.  Only take over-the-counter or prescription medicines for  pain, discomfort, or fever as directed by your caregiver. Do not use aspirin. This may increase bruising or increase bleeding if there are small areas where this is happening. SEEK IMMEDIATE MEDICAL CARE IF:  Numbness, tingling, weakness, or problem with the use of your arms or legs.  A severe headache is not relieved with medications.  There is a change in bowel or bladder control.  Increasing pain in any areas of the body.  Short of breath or dizzy.  Nauseated, vomiting, or sweating.  Increasing belly (abdominal) discomfort.  Blood in urine, stool, or vomiting blood.  Pain in either shoulder in an area where a shoulder strap would be.  Feelings of lightheadedness or if you have a fainting episode. Sometimes it is not possible to identify all injuries immediately after the trauma. It is important that you continue to monitor  your condition after the emergency department visit. If you feel you are not improving, or improving more slowly than should be expected, call your physician. If you feel your symptoms (problems) are worsening, return to the Emergency Department immediately. Document Released: 09/17/2000 Document Revised: 03/16/2011 Document Reviewed: 08/10/2007 [facility] Patient Information 2015 [place], [place]. This information is not intended to replace advice given to you by your health care provider. Make sure you discuss any questions you have with your health care provider.   Back Pain, Adult Low back pain is very common. About 1 in 5 people have back pain.The cause of low back pain is rarely dangerous. The pain often gets better over time.About half of people with a sudden onset of back pain feel better in just 2 weeks. About 8 in 10 people feel better by 6 weeks.  CAUSES Some common causes of back pain include:  Strain of the muscles or ligaments supporting the spine.  Wear and tear (degeneration) of the spinal discs.  Arthritis.  Direct injury to the back. DIAGNOSIS Most of the time, the direct cause of low back pain is not known.However, back pain can be treated effectively even when the exact cause of the pain is unknown.Answering your caregiver's questions about your overall health and symptoms is one of the most accurate ways to make sure the cause of your pain is not dangerous. If your caregiver needs more information, he or she may order lab work or imaging tests (X-rays or MRIs).However, even if imaging tests show changes in your back, this usually does not require surgery. HOME CARE INSTRUCTIONS For many people, back pain returns.Since low back pain is rarely dangerous, it is often a condition that people can learn to [facility] their own.   Remain active. It is stressful on the back to sit or stand in one place. Do not sit, drive, or stand in one place for more than 30 minutes at a time.  Take short walks on level surfaces as soon as pain allows.Try to increase the length of time you walk each day.  Do not stay in bed.Resting more than 1 or 2 days can delay your recovery.  Do not avoid exercise or work.Your body is made to move.It is not dangerous to be active, even though your back may hurt.Your back will likely heal faster if you return to being active before your pain is gone.  Pay attention to your body when you bend and lift. Many people have less discomfortwhen lifting if they bend their knees, keep the load close to their bodies,and avoid twisting. Often, the most comfortable positions are those that put less stress  on your recovering back.  Find a comfortable position to sleep. Use a firm mattress and lie on your side with your knees slightly bent. If you lie on your back, put a pillow under your knees.  Only take over-the-counter or prescription medicines as directed by your caregiver. Over-the-counter medicines to reduce pain and inflammation are often the most helpful.Your caregiver may prescribe muscle relaxant drugs.These medicines help dull your pain so you can more quickly return to your normal activities and healthy exercise.  Put ice on the injured area.  Put ice in a plastic bag.  Place a towel between your skin and the bag.  Leave the ice on for 15-20 minutes, 03-04 times a day for the first 2 to 3 days. After that, ice and heat may be alternated to reduce pain and spasms.  Ask your caregiver about trying back exercises and gentle massage. This may be of some benefit.  Avoid feeling anxious or stressed.Stress increases muscle tension and can worsen back pain.It is important to recognize when you are anxious or stressed and learn ways to manage it.Exercise is a great option. SEEK MEDICAL CARE IF:  You have pain that is not relieved with rest or medicine.  You have pain that does not improve in 1 week.  You have new symptoms.  You are  generally not feeling well. SEEK IMMEDIATE MEDICAL CARE IF:   You have pain that radiates from your back into your legs.  You develop new bowel or bladder control problems.  You have unusual weakness or numbness in your arms or legs.  You develop nausea or vomiting.  You develop abdominal pain.  You feel faint. Document Released: 12/22/2004 Document Revised: 06/23/2011 Document Reviewed: 04/25/2013 [facility] Patient Information 2015 [place]. This information is not intended to replace advice given to you by your health care provider. Make sure you discuss any questions you have with your health care provider.
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
## 2013-10-15 ENCOUNTER — Inpatient Hospital Stay (HOSPITAL_COMMUNITY)
Admission: AD | Admit: 2013-10-15 | Discharge: 2013-10-15 | Disposition: A | Discharge status: Home / Self Care | Payer: Self-pay | Source: Ambulatory Visit | Attending: Obstetrics & Gynecology | Admitting: Obstetrics & Gynecology

## 2013-10-15 ENCOUNTER — Inpatient Hospital Stay (HOSPITAL_COMMUNITY): Payer: Self-pay

## 2013-10-15 ENCOUNTER — Encounter (HOSPITAL_COMMUNITY): Payer: Self-pay | Admitting: *Deleted

## 2013-10-15 DIAGNOSIS — R102 Pelvic and perineal pain: Secondary | ICD-10-CM

## 2013-10-15 DIAGNOSIS — O209 Hemorrhage in early pregnancy, unspecified: Secondary | ICD-10-CM | POA: Insufficient documentation

## 2013-10-15 DIAGNOSIS — O99331 Smoking (tobacco) complicating pregnancy, first trimester: Secondary | ICD-10-CM | POA: Insufficient documentation

## 2013-10-15 DIAGNOSIS — Z3A01 Less than 8 weeks gestation of pregnancy: Secondary | ICD-10-CM | POA: Insufficient documentation

## 2013-10-15 DIAGNOSIS — O26899 Other specified pregnancy related conditions, unspecified trimester: Secondary | ICD-10-CM

## 2013-10-15 DIAGNOSIS — F17210 Nicotine dependence, cigarettes, uncomplicated: Secondary | ICD-10-CM | POA: Insufficient documentation

## 2013-10-15 DIAGNOSIS — O208 Other hemorrhage in early pregnancy: Secondary | ICD-10-CM

## 2013-10-15 HISTORY — DX: Other specified health status: Z78.9

## 2013-10-15 LAB — CBC WITH DIFFERENTIAL/PLATELET
Basophils Absolute: 0 10*3/uL (ref 0.0–0.1)
Basophils Relative: 0 % (ref 0–1)
Eosinophils Absolute: 0 10*3/uL (ref 0.0–0.7)
Eosinophils Relative: 0 % (ref 0–5)
HCT: 35.3 % — ABNORMAL LOW (ref 36.0–46.0)
HEMOGLOBIN: 12.4 g/dL (ref 12.0–15.0)
Lymphocytes Relative: 19 % (ref 12–46)
Lymphs Abs: 1.9 10*3/uL (ref 0.7–4.0)
MCH: 30.2 pg (ref 26.0–34.0)
MCHC: 35.1 g/dL (ref 30.0–36.0)
MCV: 85.9 fL (ref 78.0–100.0)
MONOS PCT: 6 % (ref 3–12)
Monocytes Absolute: 0.6 10*3/uL (ref 0.1–1.0)
NEUTROS PCT: 75 % (ref 43–77)
Neutro Abs: 7.6 10*3/uL (ref 1.7–7.7)
PLATELETS: 208 10*3/uL (ref 150–400)
RBC: 4.11 MIL/uL (ref 3.87–5.11)
RDW: 13.5 % (ref 11.5–15.5)
WBC: 10.2 10*3/uL (ref 4.0–10.5)

## 2013-10-15 LAB — URINE MICROSCOPIC-ADD ON

## 2013-10-15 LAB — WET PREP, GENITAL
TRICH WET PREP: NONE SEEN
Yeast Wet Prep HPF POC: NONE SEEN

## 2013-10-15 LAB — HCG, QUANTITATIVE, PREGNANCY: hCG, Beta Chain, Quant, S: 28138 m[IU]/mL — ABNORMAL HIGH (ref <5)

## 2013-10-15 LAB — URINALYSIS, ROUTINE W REFLEX MICROSCOPIC
Bilirubin Urine: NEGATIVE
GLUCOSE, UA: NEGATIVE mg/dL
Hgb urine dipstick: NEGATIVE
KETONES UR: 40 mg/dL — AB
Nitrite: NEGATIVE
Protein, ur: NEGATIVE mg/dL
Specific Gravity, Urine: 1.02 (ref 1.005–1.030)
UROBILINOGEN UA: 0.2 mg/dL (ref 0.0–1.0)
pH: 6.5 (ref 5.0–8.0)

## 2013-10-15 LAB — POCT PREGNANCY, URINE: Preg Test, Ur: POSITIVE — AB

## 2013-10-15 LAB — ABO/RH: ABO/RH(D): O POS
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
## 2013-10-15 NOTE — MAU Note (Signed)
Small amount of bleeding about an hour ago when she wiped, has bad abdominal cramping.

## 2013-10-15 NOTE — Discharge Instructions (Signed)

## 2013-10-16 LAB — RPR

## 2013-10-16 LAB — GC/CHLAMYDIA PROBE AMP
CT PROBE, AMP APTIMA: NEGATIVE
GC PROBE AMP APTIMA: NEGATIVE

## 2013-10-16 LAB — HIV ANTIBODY (ROUTINE TESTING W REFLEX): HIV 1&2 Ab, 4th Generation: NONREACTIVE

## 2013-11-06 ENCOUNTER — Encounter (HOSPITAL_COMMUNITY): Payer: Self-pay | Admitting: *Deleted

## 2013-11-08 ENCOUNTER — Encounter: Payer: Medicaid Other | Admitting: Family Medicine

## 2013-11-20 ENCOUNTER — Encounter: Payer: Medicaid Other | Admitting: Family Medicine

## 2013-12-26 ENCOUNTER — Inpatient Hospital Stay (HOSPITAL_COMMUNITY)
Admission: AD | Admit: 2013-12-26 | Discharge: 2013-12-26 | Disposition: A | Discharge status: Home / Self Care | Payer: Self-pay | Source: Ambulatory Visit | Attending: Family Medicine | Admitting: Family Medicine

## 2013-12-26 ENCOUNTER — Encounter (HOSPITAL_COMMUNITY): Payer: Self-pay | Admitting: *Deleted

## 2013-12-26 DIAGNOSIS — O99332 Smoking (tobacco) complicating pregnancy, second trimester: Secondary | ICD-10-CM | POA: Insufficient documentation

## 2013-12-26 DIAGNOSIS — Z3A17 17 weeks gestation of pregnancy: Secondary | ICD-10-CM | POA: Insufficient documentation

## 2013-12-26 DIAGNOSIS — F17210 Nicotine dependence, cigarettes, uncomplicated: Secondary | ICD-10-CM | POA: Insufficient documentation

## 2013-12-26 DIAGNOSIS — O99331 Smoking (tobacco) complicating pregnancy, first trimester: Secondary | ICD-10-CM

## 2013-12-26 DIAGNOSIS — Z3201 Encounter for pregnancy test, result positive: Secondary | ICD-10-CM

## 2013-12-26 LAB — URINALYSIS, ROUTINE W REFLEX MICROSCOPIC
Bilirubin Urine: NEGATIVE
Glucose, UA: NEGATIVE mg/dL
Hgb urine dipstick: NEGATIVE
Ketones, ur: NEGATIVE mg/dL
LEUKOCYTES UA: NEGATIVE
Nitrite: NEGATIVE
PH: 6.5 (ref 5.0–8.0)
PROTEIN: NEGATIVE mg/dL
Specific Gravity, Urine: 1.02 (ref 1.005–1.030)
UROBILINOGEN UA: 0.2 mg/dL (ref 0.0–1.0)

## 2013-12-26 NOTE — MAU Note (Signed)
Pt left without receiving d/c papers and preg. Verification letter. Will mail papers to her

## 2013-12-26 NOTE — MAU Note (Signed)
Patient states she has had no prenatal care due to pending Medicaid. States she wants to make sure everything is OK. Denies pain, bleeding or discharge. Does have nausea every morning but no vomiting. Has no appetite and feels like she is not gaining weight.

## 2013-12-26 NOTE — Discharge Instructions (Signed)
Pregnancy and Smoking Smoking during pregnancy is unhealthy for you and your developing baby. The addictive drug nicotine, carbon monoxide, and many other poisons are inhaled from a cigarette and carried through your bloodstream to your baby. Cigarette smoke contains more than 2,500 chemicals. It is not known which of these are harmful to a developing baby. However, both nicotine and carbon monoxide play a role in causing health problems in pregnancy. Smoking during pregnancy increases the risk of:  Birth defects in your baby, including heart defects.  Miscarriage and stillbirth.  Birth before 37 completed weeks of pregnancy (premature birth).  Pregnancy outside of the uterus (tubal pregnancy).  Attachment of the placenta over the opening of the uterus (placenta previa).  Detachment of the placenta before the baby's birth (placental abruption).  Breaking of the bag of waters before labor begins (premature rupture of membranes). HOW DOES SMOKING DURING PREGNANCY AFFECT MY BABY? Before Birth Smoking during pregnancy:  Decreases blood flow and oxygen to your baby.  Increases the heart rate of your baby.  Slows your baby's growth in the uterus (intrauterine growth retardation). After Birth Babies born to women who smoke during pregnancy are more likely to have a low birth weight. They are also at risk for:  Serious health problems, chronic or lifelong disabilities (cerebral palsy, mental retardation, learning problems), and death.  Sudden infant death syndrome (SIDS).  Lung and breathing problems. WHAT RESOURCES ARE AVAILABLE TO HELP ME STOP SMOKING?  Ask your health care provider for help to stop smoking. The following resources are available:  Counseling.  Psychological treatment.  Acupuncture.  Family intervention.  Hypnosis.  Nicotine supplements have not been studied enough to know if they are safe to use during pregnancy. They should only be considered when all other  methods fail, and if used under the close supervision of your health care provider.  Telephone QUIT lines. The national smoking cessation telephone hotline number is 1-800-QUIT NOW. FOR MORE INFORMATION  American Cancer Society: www.cancer.org  American Heart Association: www.heart.org  National Cancer Institute: www.cancer.gov  March of Dimes: www.marchofdimes.org Document Released: 05/05/2004 Document Revised: 12/27/2012 Document Reviewed: 11/21/2012 [facility] Patient Information 2015 [place], [place]. This information is not intended to replace advice given to you by your health care provider. Make sure you discuss any questions you have with your health care provider.  Smoking Cessation Quitting smoking is important to your health and has many advantages. However, it is not always easy to quit since nicotine is a very addictive drug. Oftentimes, people try 3 times or more before being able to quit. This document explains the best ways for you to prepare to quit smoking. Quitting takes hard work and a lot of effort, but you can do it. ADVANTAGES OF QUITTING SMOKING  You will live longer, feel better, and live better.  Your body will feel the impact of quitting smoking almost immediately.  Within 20 minutes, blood pressure decreases. Your pulse returns to its normal level.  After 8 hours, carbon monoxide levels in the blood return to normal. Your oxygen level increases.  After 24 hours, the chance of having a heart attack starts to decrease. Your breath, hair, and body stop smelling like smoke.  After 48 hours, damaged nerve endings begin to recover. Your sense of taste and smell improve.  After 72 hours, the body is virtually free of nicotine. Your bronchial tubes relax and breathing becomes easier.  After 2 to 12 weeks, lungs can hold more air. Exercise becomes easier and circulation  improves.  The risk of having a heart attack, stroke, cancer, or lung disease is greatly  reduced.  After 1 year, the risk of coronary heart disease is cut in half.  After 5 years, the risk of stroke falls to the same as a nonsmoker.  After 10 years, the risk of lung cancer is cut in half and the risk of other cancers decreases significantly.  After 15 years, the risk of coronary heart disease drops, usually to the level of a nonsmoker.  If you are pregnant, quitting smoking will improve your chances of having a healthy baby.  The people you live with, especially any children, will be healthier.  You will have extra money to spend on things other than cigarettes. QUESTIONS TO THINK ABOUT BEFORE ATTEMPTING TO QUIT You may want to talk about your answers with your health care provider.  Why do you want to quit?  If you tried to quit in the past, what helped and what did not?  What will be the most difficult situations for you after you quit? How will you plan to handle them?  Who can help you through the tough times? Your family? Friends? A health care provider?  What pleasures do you get from smoking? What ways can you still get pleasure if you quit? Here are some questions to ask your health care provider:  How can you help me to be successful at quitting?  What medicine do you think would be best for me and how should I take it?  What should I do if I need more help?  What is smoking withdrawal like? How can I get information on withdrawal? GET READY  Set a quit date.  Change your environment by getting rid of all cigarettes, ashtrays, matches, and lighters in your home, car, or work. Do not let people smoke in your home.  Review your past attempts to quit. Think about what worked and what did not. GET SUPPORT AND ENCOURAGEMENT You have a better chance of being successful if you have help. You can get support in many ways.  Tell your family, friends, and coworkers that you are going to quit and need their support. Ask them not to smoke around you.  Get  individual, group, or telephone counseling and support. Programs are available at [facility]. Call your local health department for information about programs in your area.  Spiritual beliefs and practices may help some smokers quit.  Download a "quit meter" on your computer to keep track of quit statistics, such as how long you have gone without smoking, cigarettes not smoked, and money saved.  Get a self-help book about quitting smoking and staying off tobacco. LEARN NEW SKILLS AND BEHAVIORS  Distract yourself from urges to smoke. Talk to someone, go for a walk, or occupy your time with a task.  Change your normal routine. Take a different route to work. Drink tea instead of coffee. Eat breakfast in a different place.  Reduce your stress. Take a hot bath, exercise, or read a book.  Plan something enjoyable to do every day. Reward yourself for not smoking.  Explore interactive web-based programs that specialize in helping you quit. GET MEDICINE AND USE IT CORRECTLY Medicines can help you stop smoking and decrease the urge to smoke. Combining medicine with the above behavioral methods and support can greatly increase your chances of successfully quitting smoking.  Nicotine replacement therapy helps deliver nicotine to your body without the negative effects and  risks of smoking. Nicotine replacement therapy includes nicotine gum, lozenges, inhalers, nasal sprays, and skin patches. Some may be available over-the-counter and others require a prescription.  Antidepressant medicine helps people abstain from smoking, but how this works is unknown. This medicine is available by prescription.  Nicotinic receptor partial agonist medicine simulates the effect of nicotine in your brain. This medicine is available by prescription. Ask your health care provider for advice about which medicines to use and how to use them based on your health history. Your health care provider will  tell you what side effects to look out for if you choose to be on a medicine or therapy. Carefully read the information on the package. Do not use any other product containing nicotine while using a nicotine replacement product.  RELAPSE OR DIFFICULT SITUATIONS Most relapses occur within the first 3 months after quitting. Do not be discouraged if you start smoking again. Remember, most people try several times before finally quitting. You may have symptoms of withdrawal because your body is used to nicotine. You may crave cigarettes, be irritable, feel very hungry, cough often, get headaches, or have difficulty concentrating. The withdrawal symptoms are only temporary. They are strongest when you first quit, but they will go away within 10-14 days. To reduce the chances of relapse, try to:  Avoid drinking alcohol. Drinking lowers your chances of successfully quitting.  Reduce the amount of caffeine you consume. Once you quit smoking, the amount of caffeine in your body increases and can give you symptoms, such as a rapid heartbeat, sweating, and anxiety.  Avoid smokers because they can make you want to smoke.  Do not let weight gain distract you. Many smokers will gain weight when they quit, usually less than 10 pounds. Eat a healthy diet and stay active. You can always lose the weight gained after you quit.  Find ways to improve your mood other than smoking. FOR MORE INFORMATION  www.smokefree.gov  Document Released: 12/16/2000 Document Revised: 05/08/2013 Document Reviewed: 04/02/2011 [facility] Patient Information 2015 [place]. This information is not intended to replace advice given to you by your health care provider. Make sure you discuss any questions you have with your health care provider.

## 2013-12-26 NOTE — MAU Provider Note (Signed)
  History     CSN: [id number]  Arrival date and time: 12/26/13 1010   First Provider Initiated Contact with Patient 12/26/13 1118      No chief complaint on file. Needs pregnancy verification letter and to check on baby as she has not had PNC.   HPI  Patricia BrakemanSativa Pickeral 18 y.o. G1P0 @[redacted]w[redacted]d  presents to MAU to check on baby as she's had no prenatal care and she just wants to know that her baby is okay.  She also needs letter to apply for pregnancy medicaid.  She denies abdominal pain, vaginal bleeding, vaginal discharge, fever, weakness, nausea, vomiting.  She declines STD testing;  OB History    Gravida Para Term Preterm AB TAB SAB Ectopic Multiple Living   1               Past Medical History  Diagnosis Date  . Mild obesity   . Smoker   . Medical history non-contributory     Past Surgical History  Procedure Laterality Date  . No past surgeries      Family History  Problem Relation Age of Onset  . Asthma Brother     History  Substance Use Topics  . Smoking status: Current Every Day Smoker -- 0.25 packs/day    Types: Cigarettes  . Smokeless tobacco: Never Used  . Alcohol Use: No    Allergies: No Known Allergies  Prescriptions prior to admission  Medication Sig Dispense Refill Last Dose  . Prenatal Vit-Fe Fumarate-FA (PRENATAL MULTIVITAMIN) TABS tablet Take 1 tablet by mouth daily.   12/25/2013 at Unknown time  . acetaminophen (TYLENOL) 500 MG tablet Take 1,000 mg by mouth every 6 (six) hours as needed for moderate pain or headache.   More than a month at Unknown time  . folic acid (FOLVITE) 1 MG tablet Take 1 mg by mouth daily.   10/14/2013 at Unknown time    ROS Pertinent ROS in HPI Physical Exam   Blood pressure 138/75, pulse 79, temperature 98.9 F (37.2 C), temperature source Oral, resp. rate 16, height 5\' 10"  (1.778 m), weight 180 lb 6.4 oz (81.829 kg), last menstrual period 08/26/2013, SpO2 99 %.  Physical Exam  Constitutional: She is oriented to person,  place, and time. She appears well-developed and well-nourished.  HENT:  Head: Normocephalic and atraumatic.  Eyes: EOM are normal.  Neck: Normal range of motion.  Cardiovascular: Normal rate and regular rhythm.   Respiratory: Effort normal and breath sounds normal.  GI: Soft. Bowel sounds are normal. She exhibits no distension. There is no tenderness. There is no rebound and no guarding.  Musculoskeletal: Normal range of motion.  Neurological: She is alert and oriented to person, place, and time.  Skin: Skin is warm and dry.  Psychiatric: She has a normal mood and affect.    MAU Course  Procedures  MDM Fetal heart tones are reassuring for viability.  Otherwise no symptoms to investigate  Assessment and Plan  A: Pregnancy confirmed, smoker  P: Discharge to home  Smoking cessation encouraged - Quit Smart classes advised Pregnancy verification letter given Obtain [facility] PNC asap = plans to see Dr. Mitzi HansenMoody Patient may return to MAU as needed or if her condition were to change or worsen   Bertram Denvereague Clark, Karen E 12/26/2013, 11:19 AM

## 2014-01-05 NOTE — L&D Delivery Note (Signed)
Delivery Note At 9:17 PM a viable female was delivered via  (Presentation: ROA).  APGAR: 9, 9; weight (pending).   Placenta status: Intact, .  Cord: 3v with the following complications: none  Anesthesia: Epidural Episiotomy:  None Lacerations:  None Suture Repair: None Est. Blood Loss (mL): about 200cc    Mom to postpartum.  Baby to Couplet care / Skin to Skin.  Upon arrival patient was complete and pushing. She pushed with good maternal effort to deliver a healthy baby girl, delivered without difficulty, good tone and placed on maternal abdomen for oral suctioning, drying and stimulation. Delayed cord clamping performed and cut by FOB. Placenta delivered intact with 3V cord. Vaginal canal and perineum was inspected and intact. Pitocin was started and uterus massaged until bleeding slowed. Counts of sharps, instruments, and lap pads were all correct.   Saralyn PilarAlexander Karamalegos, DO [facility], PGY-2 06/03/2014, 9:32 PM   I was gloved and present for entire delivery. Agree with note No difficulty with shoulders Aviva SignsMarie L Azalyn Sliwa, CNM

## 2014-01-15 ENCOUNTER — Inpatient Hospital Stay (HOSPITAL_COMMUNITY)
Admission: AD | Admit: 2014-01-15 | Discharge: 2014-01-15 | Disposition: A | Discharge status: Home / Self Care | Payer: Medicaid Other | Source: Ambulatory Visit | Attending: Family Medicine | Admitting: Family Medicine

## 2014-01-15 ENCOUNTER — Encounter (HOSPITAL_COMMUNITY): Payer: Self-pay | Admitting: *Deleted

## 2014-01-15 DIAGNOSIS — O99332 Smoking (tobacco) complicating pregnancy, second trimester: Secondary | ICD-10-CM | POA: Insufficient documentation

## 2014-01-15 DIAGNOSIS — G43919 Migraine, unspecified, intractable, without status migrainosus: Secondary | ICD-10-CM | POA: Diagnosis not present

## 2014-01-15 DIAGNOSIS — Z3A20 20 weeks gestation of pregnancy: Secondary | ICD-10-CM | POA: Diagnosis not present

## 2014-01-15 DIAGNOSIS — O9989 Other specified diseases and conditions complicating pregnancy, childbirth and the puerperium: Secondary | ICD-10-CM | POA: Diagnosis not present

## 2014-01-15 DIAGNOSIS — O23592 Infection of other part of genital tract in pregnancy, second trimester: Secondary | ICD-10-CM | POA: Diagnosis not present

## 2014-01-15 DIAGNOSIS — N760 Acute vaginitis: Secondary | ICD-10-CM | POA: Diagnosis not present

## 2014-01-15 DIAGNOSIS — B9689 Other specified bacterial agents as the cause of diseases classified elsewhere: Secondary | ICD-10-CM | POA: Diagnosis not present

## 2014-01-15 DIAGNOSIS — R1030 Lower abdominal pain, unspecified: Secondary | ICD-10-CM | POA: Insufficient documentation

## 2014-01-15 DIAGNOSIS — F17210 Nicotine dependence, cigarettes, uncomplicated: Secondary | ICD-10-CM | POA: Diagnosis not present

## 2014-01-15 DIAGNOSIS — R102 Pelvic and perineal pain: Secondary | ICD-10-CM

## 2014-01-15 DIAGNOSIS — Z3402 Encounter for supervision of normal first pregnancy, second trimester: Secondary | ICD-10-CM

## 2014-01-15 LAB — URINALYSIS, ROUTINE W REFLEX MICROSCOPIC
Bilirubin Urine: NEGATIVE
Glucose, UA: NEGATIVE mg/dL
HGB URINE DIPSTICK: NEGATIVE
Ketones, ur: >80 mg/dL — AB
Nitrite: NEGATIVE
Protein, ur: NEGATIVE mg/dL
Specific Gravity, Urine: 1.025 (ref 1.005–1.030)
UROBILINOGEN UA: 1 mg/dL (ref 0.0–1.0)
pH: 6 (ref 5.0–8.0)

## 2014-01-15 LAB — URINE MICROSCOPIC-ADD ON

## 2014-01-15 LAB — WET PREP, GENITAL
TRICH WET PREP: NONE SEEN
Yeast Wet Prep HPF POC: NONE SEEN

## 2014-01-15 MED ORDER — BUTALBITAL-APAP-CAFFEINE 50-325-40 MG PO TABS
2.0000 | ORAL_TABLET | Freq: Once | ORAL | Status: AC
  Administered 2014-01-15: 2 via ORAL
  Filled 2014-01-15: qty 2

## 2014-01-15 MED ORDER — METRONIDAZOLE 500 MG PO TABS: 500.0000 mg | ORAL_TABLET | Freq: Two times a day (BID) | ORAL | Status: DC

## 2014-01-15 MED ORDER — LACTATED RINGERS IV BOLUS (SEPSIS)
1000.0000 mL | Freq: Once | INTRAVENOUS | Status: AC
  Administered 2014-01-15: 1000 mL via INTRAVENOUS

## 2014-01-15 MED ORDER — BUTALBITAL-APAP-CAFFEINE 50-325-40 MG PO TABS: 1.0000 | ORAL_TABLET | Freq: Four times a day (QID) | ORAL | Status: DC | PRN

## 2014-01-15 NOTE — MAU Note (Addendum)
Has no prenatal care, waiting on pregnancy  medicaid card, abd pain, 20 weeks,  Throbbing abd pain started this am Complains of acid reflux and migraine every day, sensitive to light and sound 6# wt loss

## 2014-01-15 NOTE — Discharge Instructions (Signed)

## 2014-01-15 NOTE — MAU Provider Note (Signed)
Chief Complaint:  Abdominal Pain   First Provider Initiated Contact with Patient 01/15/14 1852      HPI: Patricia Warren is a 19 y.o. G1P0 at [redacted]w[redacted]d who presents to maternity admissions reporting pelvic pain and cramping and migraine.  Pt states since becoming pregnant she has noted persistent migraines. Usually last for several days. This current migraine is causing significant photophobia along with nausea and dec PO. Vomited x 1 yesterday.  Also reports pelvic pain since this weekend. States pain is in pelvis and extends up to belly button. Occasionally some crampy pain extending into back. Pelvic pain is constant, worsened by movement. Back pain comes and goes. Last intercourse wast last PM.  Denies contractions, leakage of fluid or vaginal bleeding. Good fetal movement.   Of note, has had no PNC. Waiting for medicaid card  Pregnancy Course:  No PNC Smoker  Past Medical History: Past Medical History  Diagnosis Date  . Mild obesity   . Smoker   . Medical history non-contributory     Past obstetric history: OB History  Gravida Para Term Preterm AB SAB TAB Ectopic Multiple Living  1         0    # Outcome Date GA Lbr Len/2nd Weight Sex Delivery Anes PTL Lv  1 Current               Past Surgical History: Past Surgical History  Procedure Laterality Date  . No past surgeries       Family History: Family History  Problem Relation Age of Onset  . Asthma Brother     Social History: History  Substance Use Topics  . Smoking status: Current Every Day Smoker -- 0.25 packs/day    Types: Cigarettes  . Smokeless tobacco: Never Used  . Alcohol Use: No    Allergies: No Known Allergies  Meds:  Prescriptions prior to admission  Medication Sig Dispense Refill Last Dose  . acetaminophen (TYLENOL) 500 MG tablet Take 1,000 mg by mouth every 6 (six) hours as needed for moderate pain or headache.   Past Week at Unknown time  . Prenatal Vit-Fe Fumarate-FA (PRENATAL MULTIVITAMIN)  TABS tablet Take 1 tablet by mouth daily.   01/14/2014 at Unknown time    ROS: Pertinent findings in history of present illness.  Physical Exam  Blood pressure 127/70, pulse 87, temperature 98.4 F (36.9 C), temperature source Oral, resp. rate 18, height  (1.727 m), weight 173 lb (78.472 kg), last menstrual period 08/26/2013, SpO2 100 %. GENERAL: Well-developed, well-nourished female. Appears uncomfortable, wincing in light.  HEENT: normocephalic HEART: normal rate RESP: normal effort ABDOMEN: Soft, non-tender, gravid appropriate for gestational age. Mildly tender to palpation along round ligaments. No SI joint pain.  EXTREMITIES: Nontender, no edema NEURO: alert and oriented SPECULUM EXAM: NEFG, copious white fishy smelling discharge, no blood, cervix clean Dilation: Closed Effacement (%): Thick Exam by:: Dr. Su Hilt  FHT:  Doppler 155   Labs: Results for orders placed or performed during the hospital encounter of 01/15/14 (from the past 24 hour(s))  Urinalysis, Routine w reflex microscopic     Status: Abnormal   Collection Time: 01/15/14  3:48 PM  Result Value Ref Range   Color, Urine YELLOW YELLOW   APPearance CLEAR CLEAR   Specific Gravity, Urine 1.025 1.005 - 1.030   pH 6.0 5.0 - 8.0   Glucose, UA NEGATIVE NEGATIVE mg/dL   Hgb urine dipstick NEGATIVE NEGATIVE   Bilirubin Urine NEGATIVE NEGATIVE   Ketones, ur >80 (A)  NEGATIVE mg/dL   Protein, ur NEGATIVE NEGATIVE mg/dL   Urobilinogen, UA 1.0 0.0 - 1.0 mg/dL   Nitrite NEGATIVE NEGATIVE   Leukocytes, UA TRACE (A) NEGATIVE  Urine microscopic-add on     Status: Abnormal   Collection Time: 01/15/14  3:48 PM  Result Value Ref Range   Squamous Epithelial / LPF MANY (A) RARE   WBC, UA 3-6 <3 WBC/hpf   RBC / HPF 0-2 <3 RBC/hpf   Bacteria, UA MANY (A) RARE   Urine-Other MUCOUS PRESENT   Wet prep, genital     Status: Abnormal   Collection Time: 01/15/14  7:31 PM  Result Value Ref Range   Yeast Wet Prep HPF POC NONE SEEN  NONE SEEN   Trich, Wet Prep NONE SEEN NONE SEEN   Clue Cells Wet Prep HPF POC FEW (A) NONE SEEN   WBC, Wet Prep HPF POC FEW (A) NONE SEEN    Imaging:  No results found. MAU Course: Pt with intractable migraine and pelvic pain, constant.  On U/A, appears dehydrated with ketones in urine and elevated spec gravity.  Will give fioricet, IVF to see if improves migraine.  Also with persistent pelvic pain, appears c/w round ligament pain. SVE c/t/h, no concerns for PTL. PE c/w round ligament pain - wet prep + for BV - gc/ct pending  After 1 L LR bolus + fioricet, pt's migraine improved dramatically from a 9 --> 5 on pain score.  Assessment: 1. Intractable migraine, unspecified migraine type   2. Lower abdominal pain   3. Encounter for supervision of normal first pregnancy in second trimester     Plan: Discharge home Flagyl for BV as well as education re round ligament pain. Advised ice, heat, stretching, position changes, tylenol prn Not in active labor or PTL fioricet for migraines Ordered outpatient anatomy scan per pt request PTL precautions       Follow-up Information    Follow up In 1 week.   Why:  If symptoms worsen       Medication List    TAKE these medications        acetaminophen 500 MG tablet  Commonly known as:  TYLENOL  Take 1,000 mg by mouth every 6 (six) hours as needed for moderate pain or headache.     butalbital-acetaminophen-caffeine 50-325-40 MG per tablet  Commonly known as:  FIORICET  Take 1-2 tablets by mouth every 6 (six) hours as needed for headache.     metroNIDAZOLE 500 MG tablet  Commonly known as:  FLAGYL  Take 1 tablet (500 mg total) by mouth 2 (two) times daily.     prenatal multivitamin Tabs tablet  Take 1 tablet by mouth daily.        Ethelda Chickaroline Tavares Levinson, MD 01/15/2014 8:14 PM

## 2014-01-16 LAB — GC/CHLAMYDIA PROBE AMP
CT Probe RNA: NEGATIVE
GC Probe RNA: NEGATIVE

## 2014-01-17 LAB — URINE CULTURE: Colony Count: 10000

## 2014-01-23 ENCOUNTER — Ambulatory Visit (HOSPITAL_COMMUNITY): Admission: RE | Admit: 2014-01-23 | Payer: Medicaid Other | Source: Ambulatory Visit

## 2014-01-26 ENCOUNTER — Ambulatory Visit (HOSPITAL_COMMUNITY)
Admission: RE | Admit: 2014-01-26 | Discharge: 2014-01-26 | Disposition: A | Discharge status: Home / Self Care | Payer: Medicaid Other | Source: Ambulatory Visit | Attending: Obstetrics and Gynecology | Admitting: Obstetrics and Gynecology

## 2014-01-26 DIAGNOSIS — Z3402 Encounter for supervision of normal first pregnancy, second trimester: Secondary | ICD-10-CM

## 2014-02-01 ENCOUNTER — Ambulatory Visit (HOSPITAL_COMMUNITY)
Admission: RE | Admit: 2014-02-01 | Discharge: 2014-02-01 | Disposition: A | Discharge status: Home / Self Care | Payer: Medicaid Other | Source: Ambulatory Visit | Attending: Obstetrics and Gynecology | Admitting: Obstetrics and Gynecology

## 2014-02-01 DIAGNOSIS — Z3402 Encounter for supervision of normal first pregnancy, second trimester: Secondary | ICD-10-CM | POA: Insufficient documentation

## 2014-02-01 DIAGNOSIS — Z3689 Encounter for other specified antenatal screening: Secondary | ICD-10-CM | POA: Insufficient documentation

## 2014-02-01 DIAGNOSIS — Z3A22 22 weeks gestation of pregnancy: Secondary | ICD-10-CM | POA: Insufficient documentation

## 2014-02-05 ENCOUNTER — Telehealth: Payer: Self-pay

## 2014-02-05 NOTE — Telephone Encounter (Signed)
-----   Message from Marcelyn DittyKasie Edick Kiser sent at 02/01/2014  2:58 PM EST ----- Regarding: U/S results We have just completed a 2nd or 3rd trimester outpatient ultrasound scheduled for a patient who was seen in MAU.  Please call the patient with the results.

## 2014-02-05 NOTE — Telephone Encounter (Addendum)
Attempted to contact patient regarding U/S results. No answer. Left message stating we are calling to inform you of your U/S results, please call clinic. U/S results show normal IUP (limited views of heart) dating 5374w2d today. Recommends follow up U/S in 4 weeks to complete anatomy. Need to inform patient of results and [facility] need to schedule NOB and f/u U/S.   02/07/14 1626: Attempted to contact patient again. [employer] customer is not available a this time. Will send letter.

## 2014-03-06 ENCOUNTER — Encounter: Payer: Self-pay | Admitting: General Practice

## 2014-03-14 ENCOUNTER — Encounter: Payer: Self-pay | Admitting: Advanced Practice Midwife

## 2014-03-14 ENCOUNTER — Ambulatory Visit (INDEPENDENT_AMBULATORY_CARE_PROVIDER_SITE_OTHER): Payer: Medicaid Other | Admitting: Advanced Practice Midwife

## 2014-03-14 VITALS — BP 131/70 | HR 86 | Wt 188.9 lb

## 2014-03-14 DIAGNOSIS — Z3482 Encounter for supervision of other normal pregnancy, second trimester: Secondary | ICD-10-CM | POA: Diagnosis not present

## 2014-03-14 DIAGNOSIS — O0932 Supervision of pregnancy with insufficient antenatal care, second trimester: Secondary | ICD-10-CM

## 2014-03-14 DIAGNOSIS — O365931 Maternal care for other known or suspected poor fetal growth, third trimester, fetus 1: Secondary | ICD-10-CM

## 2014-03-14 DIAGNOSIS — O0933 Supervision of pregnancy with insufficient antenatal care, third trimester: Secondary | ICD-10-CM | POA: Diagnosis not present

## 2014-03-14 DIAGNOSIS — Z3492 Encounter for supervision of normal pregnancy, unspecified, second trimester: Secondary | ICD-10-CM | POA: Insufficient documentation

## 2014-03-14 LAB — POCT URINALYSIS DIP (DEVICE)
BILIRUBIN URINE: NEGATIVE
GLUCOSE, UA: NEGATIVE mg/dL
Hgb urine dipstick: NEGATIVE
Ketones, ur: NEGATIVE mg/dL
Nitrite: NEGATIVE
PH: 6 (ref 5.0–8.0)
Protein, ur: NEGATIVE mg/dL
Specific Gravity, Urine: >=1.03 (ref 1.005–1.030)
UROBILINOGEN UA: 0.2 mg/dL (ref 0.0–1.0)

## 2014-03-14 MED ORDER — PRENATAL VITAMINS 28-0.8 MG PO TABS
28.0000 mg | ORAL_TABLET | Freq: Every day | ORAL | Status: AC
Start: 2014-03-14 — End: ?

## 2014-03-14 NOTE — Patient Instructions (Signed)
Second Trimester of Pregnancy The second trimester is from week 13 through week 28, month 4 through 6. This is often the time in pregnancy that you feel your best. Often times, morning sickness has lessened or quit. You may have more energy, and you may get hungry more often. Your unborn baby (fetus) is growing rapidly. At the end of the sixth month, he or she is about 9 inches long and weighs about 1 pounds. You will likely feel the baby move (quickening) between 18 and 20 weeks of pregnancy. HOME CARE   Avoid all smoking, herbs, and alcohol. Avoid drugs not approved by your doctor.  Only take medicine as told by your doctor. Some medicines are safe and some are not during pregnancy.  Exercise only as told by your doctor. Stop exercising if you start having cramps.  Eat regular, healthy meals.  Wear a good support bra if your breasts are tender.  Do not use hot tubs, steam rooms, or saunas.  Wear your seat belt when driving.  Avoid raw meat, uncooked cheese, and liter boxes and soil used by cats.  Take your prenatal vitamins.  Try taking medicine that helps you poop (stool softener) as needed, and if your doctor approves. Eat more fiber by eating fresh fruit, vegetables, and whole grains. Drink enough fluids to keep your pee (urine) clear or pale yellow.  Take warm water baths (sitz baths) to soothe pain or discomfort caused by hemorrhoids. Use hemorrhoid cream if your doctor approves.  If you have puffy, bulging veins (varicose veins), wear support hose. Raise (elevate) your feet for 15 minutes, 3-4 times a day. Limit salt in your diet.  Avoid heavy lifting, wear low heals, and sit up straight.  Rest with your legs raised if you have leg cramps or low back pain.  Visit your dentist if you have not gone during your pregnancy. Use a soft toothbrush to brush your teeth. Be gentle when you floss.  You can have sex (intercourse) unless your doctor tells you not to.  Go to your  doctor visits. GET HELP IF:   You feel dizzy.  You have mild cramps or pressure in your lower belly (abdomen).  You have a nagging pain in your belly area.  You continue to feel sick to your stomach (nauseous), throw up (vomit), or have watery poop (diarrhea).  You have bad smelling fluid coming from your vagina.  You have pain with peeing (urination). GET HELP RIGHT AWAY IF:   You have a fever.  You are leaking fluid from your vagina.  You have spotting or bleeding from your vagina.  You have severe belly cramping or pain.  You lose or gain weight rapidly.  You have trouble catching your breath and have chest pain.  You notice sudden or extreme puffiness (swelling) of your face, hands, ankles, feet, or legs.  You have not felt the baby move in over an hour.  You have severe headaches that do not go away with medicine.  You have vision changes. Document Released: 03/18/2009 Document Revised: 04/18/2012 Document Reviewed: 02/23/2012 [employer] Patient Information 2015 [employer]. This information is not intended to replace advice given to you by your health care provider. Make sure you discuss any questions you have with your health care provider.  

## 2014-03-14 NOTE — Progress Notes (Signed)
   Subjective:    Patricia Warren is a G1P0 2763w4d being seen today for her first obstetrical visit. Patient does not intend to breast feed, however after discussing both maternal and newborn benefits, she will continue to consider breastfeeding. Pregnancy history fully reviewed.  Patient reports feet and hand swelling.  Filed Vitals:   03/14/14 0913  BP: 131/70  Pulse: 86  Weight: 188 lb 14.4 oz (85.684 kg)    HISTORY: OB History  Gravida Para Term Preterm AB SAB TAB Ectopic Multiple Living  1         0    # Outcome Date GA Lbr Len/2nd Weight Sex Delivery Anes PTL Lv  1 Current              Past Medical History  Diagnosis Date  . Mild obesity   . Smoker   . Medical history non-contributory   Patient with slightly elevated BPs in the past per report, however was never diagnosed or treated for HTN H/o trichomonas  and gonorrhea in 2013, adequately treated  Past Surgical History  Procedure Laterality Date  . No past surgeries     Social History: Previously smoked 1-1.5ppd, quit at approximately [redacted]wks gestation No h/o recreational drug use  Occasional minimal alcohol use prior to knowing she was pregnant  Lives with mother, little brother, FOB.  Family History  Problem Relation Age of Onset  . Asthma Brother   Paternal side (unsure of family members): sickle cell trait    Exam  Blood pressure 131/70, pulse 86, weight 188 lb 14.4 oz (85.684 kg), last menstrual period 08/26/2013.  Skin: normal coloration and turgor, no rashes  Neurologic:  oriented, normal mood, gait normal; reflexes normal and symmetric  Extremities:  normal strength, tone, and muscle mass, no evidence of joint effusion  HEENT  PERRLA, extra ocular movement intact, sclera clear, anicteric, oropharynx clear, no lesions, neck supple with midline trachea and thyroid without masses  Mouth/Teeth  mucous membranes moist, pharynx normal without lesions and dental hygiene good  Neck  supple and no masses   Cardiovascular:  regular rate and rhythm, no murmurs or gallops  Respiratory:  appears well, vitals normal, no respiratory distress, acyanotic, normal RR, chest clear, no wheezing, crepitations, rhonchi, normal symmetric air entry  Abdomen:  soft, non-tender; bowel sounds normal; no masses, no organomegaly    Assessment:    Pregnancy: G1P0 Patient Active Problem List   Diagnosis Date Noted  . Supervision of normal pregnancy in second trimester 03/14/2014  . Encounter for fetal anatomic survey   . [redacted] weeks gestation of pregnancy   . Smoker        Plan:     Initial labs drawn (with the exception of GC/chlamydia as this was negative on 01/15/14 and cervical cancer screening given age)  Prenatal vitamins. Problem list reviewed and updated. Genetic Screening too late Ultrasound discussed; f/u U/S: ordered.  Follow up in 2 weeks. 50% of 45 min visit spent on counseling and coordination of care.    Joanna PuffDorsey, Geanie Pacifico S 03/14/2014

## 2014-03-15 LAB — PRENATAL PROFILE (SOLSTAS)
ANTIBODY SCREEN: NEGATIVE
BASOS PCT: 0 % (ref 0–1)
Basophils Absolute: 0 10*3/uL (ref 0.0–0.1)
EOS ABS: 0.1 10*3/uL (ref 0.0–0.7)
Eosinophils Relative: 1 % (ref 0–5)
HEMATOCRIT: 34.8 % — AB (ref 36.0–46.0)
HIV: NONREACTIVE
Hemoglobin: 11.6 g/dL — ABNORMAL LOW (ref 12.0–15.0)
Hepatitis B Surface Ag: NEGATIVE
LYMPHS PCT: 14 % (ref 12–46)
Lymphs Abs: 1.4 10*3/uL (ref 0.7–4.0)
MCH: 29.9 pg (ref 26.0–34.0)
MCHC: 33.3 g/dL (ref 30.0–36.0)
MCV: 89.7 fL (ref 78.0–100.0)
MONO ABS: 0.8 10*3/uL (ref 0.1–1.0)
MPV: 10.1 fL (ref 8.6–12.4)
Monocytes Relative: 8 % (ref 3–12)
NEUTROS PCT: 77 % (ref 43–77)
Neutro Abs: 7.8 10*3/uL — ABNORMAL HIGH (ref 1.7–7.7)
Platelets: 216 10*3/uL (ref 150–400)
RBC: 3.88 MIL/uL (ref 3.87–5.11)
RDW: 14.1 % (ref 11.5–15.5)
RUBELLA: 2.19 {index} — AB (ref <0.90)
Rh Type: POSITIVE
WBC: 10.1 10*3/uL (ref 4.0–10.5)

## 2014-03-15 LAB — GLUCOSE TOLERANCE, 1 HOUR (50G) W/O FASTING: GLUCOSE 1 HOUR GTT: 73 mg/dL (ref 70–140)

## 2014-03-16 ENCOUNTER — Other Ambulatory Visit: Payer: Self-pay | Admitting: Family Medicine

## 2014-03-16 ENCOUNTER — Ambulatory Visit (HOSPITAL_COMMUNITY)
Admission: RE | Admit: 2014-03-16 | Discharge: 2014-03-16 | Disposition: A | Discharge status: Home / Self Care | Payer: Medicaid Other | Source: Ambulatory Visit | Attending: Advanced Practice Midwife | Admitting: Advanced Practice Midwife

## 2014-03-16 DIAGNOSIS — Z0489 Encounter for examination and observation for other specified reasons: Secondary | ICD-10-CM | POA: Insufficient documentation

## 2014-03-16 DIAGNOSIS — Z36 Encounter for antenatal screening of mother: Secondary | ICD-10-CM | POA: Insufficient documentation

## 2014-03-16 DIAGNOSIS — O365930 Maternal care for other known or suspected poor fetal growth, third trimester, not applicable or unspecified: Secondary | ICD-10-CM | POA: Insufficient documentation

## 2014-03-16 DIAGNOSIS — IMO0002 Reserved for concepts with insufficient information to code with codable children: Secondary | ICD-10-CM | POA: Insufficient documentation

## 2014-03-16 DIAGNOSIS — Z3A28 28 weeks gestation of pregnancy: Secondary | ICD-10-CM | POA: Insufficient documentation

## 2014-03-16 DIAGNOSIS — Z3492 Encounter for supervision of normal pregnancy, unspecified, second trimester: Secondary | ICD-10-CM

## 2014-03-16 LAB — HEMOGLOBINOPATHY EVALUATION
HGB A2 QUANT: 3 % (ref 2.2–3.2)
HGB S QUANTITAION: 0 %
Hemoglobin Other: 0 %
Hgb A: 97 % (ref 96.8–97.8)
Hgb F Quant: 0 % (ref 0.0–2.0)

## 2014-03-18 LAB — CANNABANOIDS (GC/LC/MS), URINE: THC-COOH (GC/LC/MS), ur confirm: 278 ng/mL — AB (ref <5)

## 2014-03-19 ENCOUNTER — Encounter: Payer: Self-pay | Admitting: Advanced Practice Midwife

## 2014-03-19 NOTE — Progress Notes (Signed)
I was present for the exam and agree with above.  

## 2014-03-20 ENCOUNTER — Encounter: Payer: Self-pay | Admitting: Advanced Practice Midwife

## 2014-03-20 DIAGNOSIS — F1290 Cannabis use, unspecified, uncomplicated: Secondary | ICD-10-CM | POA: Insufficient documentation

## 2014-03-20 LAB — PRESCRIPTION MONITORING PROFILE (19 PANEL)
Amphetamine/Meth: NEGATIVE ng/mL
BUPRENORPHINE, URINE: NEGATIVE ng/mL
Barbiturate Screen, Urine: NEGATIVE ng/mL
Benzodiazepine Screen, Urine: NEGATIVE ng/mL
CARISOPRODOL, URINE: NEGATIVE ng/mL
CREATININE, URINE: 152.64 mg/dL (ref >20.0)
Cocaine Metabolites: NEGATIVE ng/mL
Fentanyl, Ur: NEGATIVE ng/mL
MDMA URINE: NEGATIVE ng/mL
METHAQUALONE SCREEN (URINE): NEGATIVE ng/mL
Meperidine, Ur: NEGATIVE ng/mL
Methadone Screen, Urine: NEGATIVE ng/mL
NITRITES URINE, INITIAL: NEGATIVE ug/mL
OPIATE SCREEN, URINE: NEGATIVE ng/mL
Oxycodone Screen, Ur: NEGATIVE ng/mL
PROPOXYPHENE: NEGATIVE ng/mL
Phencyclidine, Ur: NEGATIVE ng/mL
TAPENTADOLUR: NEGATIVE ng/mL
Tramadol Scrn, Ur: NEGATIVE ng/mL
Zolpidem, Urine: NEGATIVE ng/mL
pH, Initial: 5.9 pH (ref 4.5–8.9)

## 2014-03-28 ENCOUNTER — Ambulatory Visit (INDEPENDENT_AMBULATORY_CARE_PROVIDER_SITE_OTHER): Payer: Medicaid Other | Admitting: Advanced Practice Midwife

## 2014-03-28 VITALS — BP 117/70 | HR 86 | Wt 191.8 lb

## 2014-03-28 DIAGNOSIS — O0932 Supervision of pregnancy with insufficient antenatal care, second trimester: Secondary | ICD-10-CM

## 2014-03-28 LAB — POCT URINALYSIS DIP (DEVICE)
Bilirubin Urine: NEGATIVE
Glucose, UA: NEGATIVE mg/dL
Hgb urine dipstick: NEGATIVE
KETONES UR: NEGATIVE mg/dL
Nitrite: NEGATIVE
PH: 6 (ref 5.0–8.0)
PROTEIN: NEGATIVE mg/dL
Specific Gravity, Urine: 1.025 (ref 1.005–1.030)
UROBILINOGEN UA: 0.2 mg/dL (ref 0.0–1.0)

## 2014-03-28 NOTE — Progress Notes (Signed)
Doing well.  Good fetal movement, denies vaginal bleeding, LOF, regular contractions.  PTL precautions/reasons to come to hospital reviewed.

## 2014-04-04 DIAGNOSIS — O0933 Supervision of pregnancy with insufficient antenatal care, third trimester: Secondary | ICD-10-CM | POA: Diagnosis not present

## 2014-04-11 ENCOUNTER — Telehealth: Payer: Self-pay | Admitting: Advanced Practice Midwife

## 2014-04-11 ENCOUNTER — Encounter: Payer: Self-pay | Admitting: Advanced Practice Midwife

## 2014-04-11 NOTE — Telephone Encounter (Signed)
Called patient unable to leave message regarding missed appointment. Mailing patient certified letter.

## 2014-04-27 ENCOUNTER — Encounter: Payer: Self-pay | Admitting: Physician Assistant

## 2014-05-23 LAB — OB RESULTS CONSOLE GBS: STREP GROUP B AG: NEGATIVE

## 2014-05-30 ENCOUNTER — Other Ambulatory Visit: Payer: Self-pay | Admitting: Certified Nurse Midwife

## 2014-05-30 ENCOUNTER — Ambulatory Visit (INDEPENDENT_AMBULATORY_CARE_PROVIDER_SITE_OTHER): Payer: Medicaid Other | Admitting: Certified Nurse Midwife

## 2014-05-30 VITALS — BP 135/76 | HR 93 | Temp 97.8°F | Wt 203.4 lb

## 2014-05-30 DIAGNOSIS — O0933 Supervision of pregnancy with insufficient antenatal care, third trimester: Secondary | ICD-10-CM | POA: Diagnosis not present

## 2014-05-30 LAB — OB RESULTS CONSOLE GC/CHLAMYDIA
Chlamydia: NEGATIVE
Gonorrhea: NEGATIVE

## 2014-05-30 LAB — POCT URINALYSIS DIP (DEVICE)
Bilirubin Urine: NEGATIVE
Glucose, UA: NEGATIVE mg/dL
Hgb urine dipstick: NEGATIVE
Ketones, ur: NEGATIVE mg/dL
Nitrite: NEGATIVE
Protein, ur: NEGATIVE mg/dL
Specific Gravity, Urine: >=1.03 (ref 1.005–1.030)
Urobilinogen, UA: 0.2 mg/dL (ref 0.0–1.0)
pH: 6 (ref 5.0–8.0)

## 2014-05-30 NOTE — Progress Notes (Signed)
Reports lower back pain; also reports pelvic pressure

## 2014-05-30 NOTE — Patient Instructions (Signed)
Group B Streptococcus Infection During Pregnancy Group B streptococcus (GBS) is a type of bacteria often found in healthy women. GBS is not the same as the bacteria that causes strep throat. You may have GBS in your vagina, rectum, or bladder. GBS does not spread through sexual contact, but it can be passed to a baby during childbirth. This can be dangerous for your baby. It is not dangerous to you and usually does not cause any symptoms. Your health care provider may test you for GBS when your pregnancy is between 35 and 37 weeks. GBS is dangerous only during birth, so there is no need to test for it earlier. It is possible to have GBS during pregnancy and never pass it to your baby. If your test results are positive for GBS, your health care provider may recommend giving you antibiotic medicine during delivery to make sure your baby stays healthy. RISK FACTORS You are more likely to pass GBS to your baby if:   Your water breaks (ruptured membrane) or you go into labor before 37 weeks.  Your water breaks 18 hours before you deliver.  You passed GBS during a previous pregnancy.  You have a urinary tract infection caused by GBS any time during pregnancy.  You have a fever during labor. SYMPTOMS Most women who have GBS do not have any symptoms. If you have a urinary tract infection caused by GBS, you might have frequent or painful urination and fever. Babies who get GBS usually show symptoms within 7 days of birth. Symptoms may include:   Breathing problems.  Heart and blood pressure problems.  Digestive and kidney problems. DIAGNOSIS Routine screening for GBS is recommended for all pregnant women. A health care provider takes a sample of the fluid in your vagina and rectum with a swab. It is then sent to a lab to be checked for GBS. A sample of your urine may also be checked for the bacteria.  TREATMENT If you test positive for GBS, you may need treatment with an antibiotic medicine during  labor. As soon as you go into labor, or as soon as your membranes rupture, you will get the antibiotic medicine through an IV access. You will continue to get the medicine until after you give birth. You do not need antibiotic medicine if you are having a cesarean delivery.If your baby shows signs or symptoms of GBS after birth, your baby can also be treated with an antibiotic medicine. HOME CARE INSTRUCTIONS   Take all antibiotic medicine as prescribed by your health care provider. Only take medicine as directed.   Continue with prenatal visits and care.   Keep all follow-up appointments.  SEEK MEDICAL CARE IF:   You have pain when you urinate.   You have to urinate frequently.   You have a fever.  SEEK IMMEDIATE MEDICAL CARE IF:   Your membranes rupture.  You go into labor. Document Released: 03/31/2007 Document Revised: 12/27/2012 Document Reviewed: 10/14/2012 [facility] Patient Information 2015 [place], [place]. This information is not intended to replace advice given to you by your health care provider. Make sure you discuss any questions you have with your health care provider. Third Trimester of Pregnancy The third trimester is from week 29 through week 42, months 7 through 9. The third trimester is a time when the fetus is growing rapidly. At the end of the ninth month, the fetus is about 20 inches in length and weighs 6-10 pounds.  BODY CHANGES Your body goes through many  changes during pregnancy. The changes vary from woman to woman.   Your weight will continue to increase. You can expect to gain 25-35 pounds (11-16 kg) by the end of the pregnancy.  You may begin to get stretch marks on your hips, abdomen, and breasts.  You may urinate more often because the fetus is moving lower into your pelvis and pressing on your bladder.  You may develop or continue to have heartburn as a result of your pregnancy.  You may develop constipation because certain hormones are causing  the muscles that push waste through your intestines to slow down.  You may develop hemorrhoids or swollen, bulging veins (varicose veins).  You may have pelvic pain because of the weight gain and pregnancy hormones relaxing your joints between the bones in your pelvis. Backaches may result from overexertion of the muscles supporting your posture.  You may have changes in your hair. These can include thickening of your hair, rapid growth, and changes in texture. Some women also have hair loss during or after pregnancy, or hair that feels dry or thin. Your hair will most likely return to normal after your baby is born.  Your breasts will continue to grow and be tender. A yellow discharge may leak from your breasts called colostrum.  Your belly button may stick out.  You may feel short of breath because of your expanding uterus.  You may notice the fetus "dropping," or moving lower in your abdomen.  You may have a bloody mucus discharge. This usually occurs a few days to a week before labor begins.  Your cervix becomes thin and soft (effaced) near your due date. WHAT TO EXPECT AT YOUR PRENATAL EXAMS  You will have prenatal exams every 2 weeks until week 36. Then, you will have weekly prenatal exams. During a routine prenatal visit:  You will be weighed to make sure you and the fetus are growing normally.  Your blood pressure is taken.  Your abdomen will be measured to track your baby's growth.  The fetal heartbeat will be listened to.  Any test results from the previous visit will be discussed.  You may have a cervical check near your due date to see if you have effaced. At around 36 weeks, your caregiver will check your cervix. At the same time, your caregiver will also perform a test on the secretions of the vaginal tissue. This test is to determine if a type of bacteria, Group B streptococcus, is present. Your caregiver will explain this further. Your caregiver may ask you:  What  your birth plan is.  How you are feeling.  If you are feeling the baby move.  If you have had any abnormal symptoms, such as leaking fluid, bleeding, severe headaches, or abdominal cramping.  If you have any questions. Other tests or screenings that may be performed during your third trimester include:  Blood tests that check for low iron levels (anemia).  Fetal testing to check the health, activity level, and growth of the fetus. Testing is done if you have certain medical conditions or if there are problems during the pregnancy. FALSE LABOR You may feel small, irregular contractions that eventually go away. These are called Braxton Hicks contractions, or false labor. Contractions may last for hours, days, or even weeks before true labor sets in. If contractions come at regular intervals, intensify, or become painful, it is best to be seen by your caregiver.  SIGNS OF LABOR   Menstrual-like cramps.  Contractions that   are 5 minutes apart or less.  Contractions that start on the top of the uterus and spread down to the lower abdomen and back.  A sense of increased pelvic pressure or back pain.  A watery or bloody mucus discharge that comes from the vagina. If you have any of these signs before the 37th week of pregnancy, call your caregiver right away. You need to go to the hospital to get checked immediately. HOME CARE INSTRUCTIONS   Avoid all smoking, herbs, alcohol, and unprescribed drugs. These chemicals affect the formation and growth of the baby.  Follow your caregiver's instructions regarding medicine use. There are medicines that are either safe or unsafe to take during pregnancy.  Exercise only as directed by your caregiver. Experiencing uterine cramps is a good sign to stop exercising.  Continue to eat regular, healthy meals.  Wear a good support bra for breast tenderness.  Do not use hot tubs, steam rooms, or saunas.  Wear your seat belt at all times when  driving.  Avoid raw meat, uncooked cheese, cat litter boxes, and soil used by cats. These carry germs that can cause birth defects in the baby.  Take your prenatal vitamins.  Try taking a stool softener (if your caregiver approves) if you develop constipation. Eat more high-fiber foods, such as fresh vegetables or fruit and whole grains. Drink plenty of fluids to keep your urine clear or pale yellow.  Take warm sitz baths to soothe any pain or discomfort caused by hemorrhoids. Use hemorrhoid cream if your caregiver approves.  If you develop varicose veins, wear support hose. Elevate your feet for 15 minutes, 3-4 times a day. Limit salt in your diet.  Avoid heavy lifting, wear low heal shoes, and practice good posture.  Rest a lot with your legs elevated if you have leg cramps or low back pain.  Visit your dentist if you have not gone during your pregnancy. Use a soft toothbrush to brush your teeth and be gentle when you floss.  A sexual relationship may be continued unless your caregiver directs you otherwise.  Do not travel far distances unless it is absolutely necessary and only with the approval of your caregiver.  Take prenatal classes to understand, practice, and ask questions about the labor and delivery.  Make a trial run to the hospital.  Pack your hospital bag.  Prepare the baby's nursery.  Continue to go to all your prenatal visits as directed by your caregiver. SEEK MEDICAL CARE IF:  You are unsure if you are in labor or if your water has broken.  You have dizziness.  You have mild pelvic cramps, pelvic pressure, or nagging pain in your abdominal area.  You have persistent nausea, vomiting, or diarrhea.  You have a bad smelling vaginal discharge.  You have pain with urination. SEEK IMMEDIATE MEDICAL CARE IF:   You have a fever.  You are leaking fluid from your vagina.  You have spotting or bleeding from your vagina.  You have severe abdominal cramping or  pain.  You have rapid weight loss or gain.  You have shortness of breath with chest pain.  You notice sudden or extreme swelling of your face, hands, ankles, feet, or legs.  You have not felt your baby move in over an hour.  You have severe headaches that do not go away with medicine.  You have vision changes. Document Released: 12/16/2000 Document Revised: 12/27/2012 Document Reviewed: 02/23/2012 [facility] Patient Information 2015 [place]. This information is not  intended to replace advice given to you by your health care provider. Make sure you discuss any questions you have with your health care provider.

## 2014-05-30 NOTE — Progress Notes (Signed)
03/16/14: AC 5%, EFW 27%. Normal dopplers.. Growth US in 3 weeks. Will need U/S scheduled. [place] prenatal care there. GBS and GC/Ch cx's obtained today.

## 2014-05-31 LAB — GC/CHLAMYDIA PROBE AMP
CT Probe RNA: NEGATIVE
GC Probe RNA: NEGATIVE

## 2014-06-01 LAB — CULTURE, BETA STREP (GROUP B ONLY)

## 2014-06-02 ENCOUNTER — Inpatient Hospital Stay (HOSPITAL_COMMUNITY)
Admission: AD | Admit: 2014-06-02 | Discharge: 2014-06-02 | Disposition: A | Discharge status: Home / Self Care | Payer: Medicaid Other | Source: Ambulatory Visit | Attending: Obstetrics & Gynecology | Admitting: Obstetrics & Gynecology

## 2014-06-02 ENCOUNTER — Inpatient Hospital Stay (HOSPITAL_COMMUNITY): Payer: Medicaid Other

## 2014-06-02 ENCOUNTER — Encounter (HOSPITAL_COMMUNITY): Payer: Self-pay | Admitting: *Deleted

## 2014-06-02 DIAGNOSIS — IMO0002 Reserved for concepts with insufficient information to code with codable children: Secondary | ICD-10-CM

## 2014-06-02 DIAGNOSIS — O0933 Supervision of pregnancy with insufficient antenatal care, third trimester: Secondary | ICD-10-CM

## 2014-06-02 NOTE — Progress Notes (Signed)
Patient presented for labor check to MAU. Reviewed NST which was not reactive(no accels). Ordered a BPP which was 8/8. Overall BPP with NST is 8/10 which is reassuring.  Noted that patient has subjectively reduced AFI with greatest pocket being 2.06.   Nurse to discharge patient with follow-up with OB provider. Would consider repeating NST soon. Patient not in labor.   Caryl AdaJazma Agnes Probert, DO 06/02/2014, 4:06 PM PGY-1, [facility]

## 2014-06-02 NOTE — MAU Note (Signed)
Pt. States she has been contracting since 0500. Last OB appointment was Thursday and she was 1cm. Next appointment for US is May 31 and regular visit is June 3rd. Pt. Is having another ultrasound due to baby measuring 3 weeks behind. Pt. States baby is moving well. Denies LOF or bleeding.

## 2014-06-03 ENCOUNTER — Inpatient Hospital Stay (HOSPITAL_COMMUNITY)
Admission: AD | Admit: 2014-06-03 | Discharge: 2014-06-03 | Disposition: A | Discharge status: Home / Self Care | Payer: Medicaid Other | Source: Ambulatory Visit | Attending: Obstetrics & Gynecology | Admitting: Obstetrics & Gynecology

## 2014-06-03 ENCOUNTER — Inpatient Hospital Stay (HOSPITAL_COMMUNITY): Payer: Medicaid Other | Admitting: Anesthesiology

## 2014-06-03 ENCOUNTER — Encounter (HOSPITAL_COMMUNITY): Payer: Self-pay | Admitting: *Deleted

## 2014-06-03 ENCOUNTER — Inpatient Hospital Stay (HOSPITAL_COMMUNITY)
Admission: AD | Admit: 2014-06-03 | Discharge: 2014-06-05 | DRG: 775 | Disposition: A | Discharge status: Home / Self Care | Payer: Medicaid Other | Source: Ambulatory Visit | Attending: Family Medicine | Admitting: Family Medicine

## 2014-06-03 DIAGNOSIS — O4103X0 Oligohydramnios, third trimester, not applicable or unspecified: Secondary | ICD-10-CM

## 2014-06-03 DIAGNOSIS — Z3A40 40 weeks gestation of pregnancy: Secondary | ICD-10-CM | POA: Diagnosis not present

## 2014-06-03 DIAGNOSIS — Z87891 Personal history of nicotine dependence: Secondary | ICD-10-CM

## 2014-06-03 DIAGNOSIS — O0933 Supervision of pregnancy with insufficient antenatal care, third trimester: Secondary | ICD-10-CM

## 2014-06-03 LAB — CBC
HEMATOCRIT: 34.2 % — AB (ref 36.0–46.0)
Hemoglobin: 12 g/dL (ref 12.0–15.0)
MCH: 30.5 pg (ref 26.0–34.0)
MCHC: 35.1 g/dL (ref 30.0–36.0)
MCV: 87 fL (ref 78.0–100.0)
PLATELETS: 186 10*3/uL (ref 150–400)
RBC: 3.93 MIL/uL (ref 3.87–5.11)
RDW: 13.8 % (ref 11.5–15.5)
WBC: 14.4 10*3/uL — ABNORMAL HIGH (ref 4.0–10.5)

## 2014-06-03 LAB — TYPE AND SCREEN
ABO/RH(D): O POS
ANTIBODY SCREEN: NEGATIVE

## 2014-06-03 MED ORDER — PHENYLEPHRINE 40 MCG/ML (10ML) SYRINGE FOR IV PUSH (FOR BLOOD PRESSURE SUPPORT)
80.0000 ug | PREFILLED_SYRINGE | INTRAVENOUS | Status: DC | PRN
  Filled 2014-06-03: qty 2
  Filled 2014-06-03: qty 20

## 2014-06-03 MED ORDER — BUPIVACAINE HCL (PF) 0.25 % IJ SOLN
INTRAMUSCULAR | Status: DC | PRN
  Administered 2014-06-03 (×2): 4 mL

## 2014-06-03 MED ORDER — TERBUTALINE SULFATE 1 MG/ML IJ SOLN
0.2500 mg | Freq: Once | INTRAMUSCULAR | Status: AC | PRN
  Administered 2014-06-03: 0.25 mg via SUBCUTANEOUS
  Filled 2014-06-03: qty 1

## 2014-06-03 MED ORDER — OXYCODONE-ACETAMINOPHEN 5-325 MG PO TABS: 2.0000 | ORAL_TABLET | ORAL | Status: DC | PRN

## 2014-06-03 MED ORDER — IBUPROFEN 600 MG PO TABS
600.0000 mg | ORAL_TABLET | Freq: Four times a day (QID) | ORAL | Status: DC
  Administered 2014-06-03 – 2014-06-05 (×6): 600 mg via ORAL
  Filled 2014-06-03 (×6): qty 1

## 2014-06-03 MED ORDER — OXYTOCIN 40 UNITS IN LACTATED RINGERS INFUSION - SIMPLE MED
62.5000 mL/h | INTRAVENOUS | Status: DC
  Filled 2014-06-03: qty 1000

## 2014-06-03 MED ORDER — ZOLPIDEM TARTRATE 5 MG PO TABS
5.0000 mg | ORAL_TABLET | Freq: Once | ORAL | Status: AC
  Administered 2014-06-03: 5 mg via ORAL
  Filled 2014-06-03: qty 1

## 2014-06-03 MED ORDER — EPHEDRINE 5 MG/ML INJ
10.0000 mg | INTRAVENOUS | Status: DC | PRN
  Filled 2014-06-03: qty 2

## 2014-06-03 MED ORDER — OXYTOCIN 40 UNITS IN LACTATED RINGERS INFUSION - SIMPLE MED
1.0000 m[IU]/min | INTRAVENOUS | Status: DC
  Administered 2014-06-03: 2 m[IU]/min via INTRAVENOUS
  Filled 2014-06-03: qty 1000

## 2014-06-03 MED ORDER — LACTATED RINGERS IV SOLN
500.0000 mL | INTRAVENOUS | Status: DC | PRN
  Administered 2014-06-03 (×2): 1000 mL via INTRAVENOUS

## 2014-06-03 MED ORDER — DIPHENHYDRAMINE HCL 50 MG/ML IJ SOLN: 12.5000 mg | INTRAMUSCULAR | Status: DC | PRN

## 2014-06-03 MED ORDER — FENTANYL 2.5 MCG/ML BUPIVACAINE 1/10 % EPIDURAL INFUSION (WH - ANES)
14.0000 mL/h | INTRAMUSCULAR | Status: DC | PRN
  Administered 2014-06-03: 14 mL/h via EPIDURAL
  Administered 2014-06-03: 10 mL/h via EPIDURAL
  Administered 2014-06-03: 14 mL/h via EPIDURAL
  Filled 2014-06-03 (×2): qty 125

## 2014-06-03 MED ORDER — LIDOCAINE HCL (PF) 1 % IJ SOLN
30.0000 mL | INTRAMUSCULAR | Status: DC | PRN
  Filled 2014-06-03: qty 30

## 2014-06-03 MED ORDER — ONDANSETRON HCL 4 MG/2ML IJ SOLN
4.0000 mg | Freq: Four times a day (QID) | INTRAMUSCULAR | Status: DC | PRN
Start: 2014-06-03 — End: 2014-06-04

## 2014-06-03 MED ORDER — OXYCODONE-ACETAMINOPHEN 5-325 MG PO TABS: 1.0000 | ORAL_TABLET | ORAL | Status: DC | PRN

## 2014-06-03 MED ORDER — LACTATED RINGERS IV SOLN
INTRAVENOUS | Status: DC
  Administered 2014-06-03 (×2): via INTRAVENOUS

## 2014-06-03 MED ORDER — ACETAMINOPHEN 325 MG PO TABS: 650.0000 mg | ORAL_TABLET | ORAL | Status: DC | PRN

## 2014-06-03 MED ORDER — LACTATED RINGERS IV SOLN
INTRAVENOUS | Status: DC
  Administered 2014-06-03: 21:00:00 via INTRAUTERINE

## 2014-06-03 MED ORDER — OXYTOCIN BOLUS FROM INFUSION
500.0000 mL | INTRAVENOUS | Status: DC
  Administered 2014-06-03: 500 mL via INTRAVENOUS

## 2014-06-03 MED ORDER — CITRIC ACID-SODIUM CITRATE 334-500 MG/5ML PO SOLN: 30.0000 mL | ORAL | Status: DC | PRN

## 2014-06-03 MED ORDER — LIDOCAINE-EPINEPHRINE (PF) 2 %-1:200000 IJ SOLN
INTRAMUSCULAR | Status: DC | PRN
  Administered 2014-06-03: 3 mL

## 2014-06-03 NOTE — Progress Notes (Signed)
Patricia Warren is a 19 y.o. G1P0 at 4134w1d by ultrasound admitted for nonreassurring fetal heart tones  Subjective: Several variables noted in [place]. Pt cervix has changed from 1cm to 2-3 cm. She is in early labor and will be admitted to L/D  Objective: BP 137/80 mmHg  Pulse 88  Temp(Src) 98.4 F (36.9 C) (Oral)  Resp 20  Ht 5\' 8"  (1.727 m)  Wt 92.987 kg (205 lb)  BMI 31.18 kg/m2  SpO2 100%  LMP 08/26/2013      FHT:  FHR: 135 bpm, variability: moderate,  accelerations:  Present,  decelerations:  Present occasional variables UC:   irregular, every 6 minutes SVE:   Dilation: 3 Effacement (%): 90 Station: -2 Exam by:: Patricia RainwaterPamela Lawson RN   Labs: Lab Results  Component Value Date   WBC 14.4* 06/03/2014   HGB 12.0 06/03/2014   HCT 34.2* 06/03/2014   MCV 87.0 06/03/2014   PLT 186 06/03/2014    Assessment / Plan: Augment labor with Pitocin  Labor: First stage Preeclampsia:   Fetal Wellbeing:  Reassurring Cat 2 Pain Control:  Epidural I/D:  GBS Negative Anticipated MOD:  NSVD  Patricia Warren 06/03/2014, 1:02 PM

## 2014-06-03 NOTE — Progress Notes (Signed)
Towels changed at perineum

## 2014-06-03 NOTE — Discharge Instructions (Signed)
Keep appointments on Monday in Clinic as planned   Hypertension During Pregnancy Hypertension, or high blood pressure, is when there is extra pressure inside your blood vessels that carry blood from the heart to the rest of your body (arteries). It can happen at any time in life, including pregnancy. Hypertension during pregnancy can cause problems for you and your baby. Your baby might not weigh as much as he or she should at birth or might be born early (premature). Very bad cases of hypertension during pregnancy can be life-threatening.  Different types of hypertension can occur during pregnancy. These include:  Chronic hypertension. This happens when a woman has hypertension before pregnancy and it continues during pregnancy.  Gestational hypertension. This is when hypertension develops during pregnancy.  Preeclampsia or toxemia of pregnancy. This is a very serious type of hypertension that develops only during pregnancy. It affects the whole body and can be very dangerous for both mother and baby.  Gestational hypertension and preeclampsia usually go away after your baby is born. Your blood pressure will likely stabilize within 6 weeks. Women who have hypertension during pregnancy have a greater chance of developing hypertension later in life or with future pregnancies. RISK FACTORS There are certain factors that make it more likely for you to develop hypertension during pregnancy. These include:  Having hypertension before pregnancy.  Having hypertension during a previous pregnancy.  Being overweight.  Being older than 40 years.  Being pregnant with more than one baby.  Having diabetes or kidney problems. SIGNS AND SYMPTOMS Chronic and gestational hypertension rarely cause symptoms. Preeclampsia has symptoms, which may include:  Increased protein in your urine. Your health care provider will check for this at every prenatal visit.  Swelling of your hands and face.  Rapid  weight gain.  Headaches.  Visual changes.  Being bothered by light.  Abdominal pain, especially in the upper right area.  Chest pain.  Shortness of breath.  Increased reflexes.  Seizures. These occur with a more severe form of preeclampsia, called eclampsia. DIAGNOSIS  You may be diagnosed with hypertension during a regular prenatal exam. At each prenatal visit, you may have:  Your blood pressure checked.  A urine test to check for protein in your urine. The type of hypertension you are diagnosed with depends on when you developed it. It also depends on your specific blood pressure reading.  Developing hypertension before 20 weeks of pregnancy is consistent with chronic hypertension.  Developing hypertension after 20 weeks of pregnancy is consistent with gestational hypertension.  Hypertension with increased urinary protein is diagnosed as preeclampsia.  Blood pressure measurements that stay above 160 systolic or 110 diastolic are a sign of severe preeclampsia. TREATMENT Treatment for hypertension during pregnancy varies. Treatment depends on the type of hypertension and how serious it is.  If you take medicine for chronic hypertension, you may need to switch medicines.  Medicines called ACE inhibitors should not be taken during pregnancy.  Low-dose aspirin may be suggested for women who have risk factors for preeclampsia.  If you have gestational hypertension, you may need to take a blood pressure medicine that is safe during pregnancy. Your health care provider will recommend the correct medicine.  If you have severe preeclampsia, you may need to be in the hospital. Health care providers will watch you and your baby very closely. You also may need to take medicine called magnesium sulfate to prevent seizures and lower blood pressure.  Sometimes, an early delivery is needed. This may  be the case if the condition worsens. It would be done to protect you and your baby. The  only cure for preeclampsia is delivery.  Your health care provider may recommend that you take one low-dose aspirin (81 mg) each day to help prevent high blood pressure during your pregnancy if you are at risk for preeclampsia. You may be at risk for preeclampsia if:  You had preeclampsia or eclampsia during a previous pregnancy.  Your baby did not grow as expected during a previous pregnancy.  You experienced preterm birth with a previous pregnancy.  You experienced a separation of the placenta from the uterus (placental abruption) during a previous pregnancy.  You experienced the loss of your baby during a previous pregnancy.  You are pregnant with more than one baby.  You have other medical conditions, such as diabetes or an autoimmune disease. HOME CARE INSTRUCTIONS  Schedule and keep all of your regular prenatal care appointments. This is important.  Take medicines only as directed by your health care provider. Tell your health care provider about all medicines you take.  Eat as little salt as possible.  Get regular exercise.  Do not drink alcohol.  Do not use tobacco products.  Do not drink products with caffeine.  Lie on your left side when resting. SEEK IMMEDIATE MEDICAL CARE IF:  You have severe abdominal pain.  You have sudden swelling in your hands, ankles, or face.  You gain 4 pounds (1.8 kg) or more in 1 week.  You vomit repeatedly.  You have vaginal bleeding.  You do not feel your baby moving as much.  You have a headache.  You have blurred or double vision.  You have muscle twitching or spasms.  You have shortness of breath.  You have blue fingernails or lips.  You have blood in your urine. MAKE SURE YOU:  Understand these instructions.  Will watch your condition.  Will get help right away if you are not doing well or get worse. Document Released: 09/09/2010 Document Revised: 05/08/2013 Document Reviewed: 07/21/2012 [facility] Patient  Information 2015 [place], [place]. This information is not intended to replace advice given to you by your health care provider. Make sure you discuss any questions you have with your health care provider. Fetal Movement Counts Patient Name: __________________________________________________ Patient Due Date: ____________________ Performing a fetal movement count is highly recommended in high-risk pregnancies, but it is good for every pregnant woman to do. Your health care provider may ask you to start counting fetal movements at 28 weeks of the pregnancy. Fetal movements often increase:  After eating a full meal.  After physical activity.  After eating or drinking something sweet or cold.  At rest. Pay attention to when you feel the baby is most active. This will help you notice a pattern of your baby's sleep and wake cycles and what factors contribute to an increase in fetal movement. It is important to perform a fetal movement count at the same time each day when your baby is normally most active.  HOW TO COUNT FETAL MOVEMENTS  Find a quiet and comfortable area to sit or lie down on your left side. Lying on your left side provides the best blood and oxygen circulation to your baby.  Write down the day and time on a sheet of paper or in a journal.  Start counting kicks, flutters, swishes, rolls, or jabs in a 2-hour period. You should feel at least 10 movements within 2 hours.  If you do not  feel 10 movements in 2 hours, wait 2-3 hours and count again. Look for a change in the pattern or not enough counts in 2 hours. SEEK MEDICAL CARE IF:  You feel less than 10 counts in 2 hours, tried twice.  There is no movement in over an hour.  The pattern is changing or taking longer each day to reach 10 counts in 2 hours.  You feel the baby is not moving as he or she usually does. Date: ____________ Movements: ____________ Start time: ____________ Doreatha Martin time: ____________  Date: ____________  Movements: ____________ Start time: ____________ Doreatha Martin time: ____________ Date: ____________ Movements: ____________ Start time: ____________ Doreatha Martin time: ____________ Date: ____________ Movements: ____________ Start time: ____________ Doreatha Martin time: ____________ Date: ____________ Movements: ____________ Start time: ____________ Doreatha Martin time: ____________ Date: ____________ Movements: ____________ Start time: ____________ Doreatha Martin time: ____________ Date: ____________ Movements: ____________ Start time: ____________ Doreatha Martin time: ____________ Date: ____________ Movements: ____________ Start time: ____________ Doreatha Martin time: ____________  Date: ____________ Movements: ____________ Start time: ____________ Doreatha Martin time: ____________ Date: ____________ Movements: ____________ Start time: ____________ Doreatha Martin time: ____________ Date: ____________ Movements: ____________ Start time: ____________ Doreatha Martin time: ____________ Date: ____________ Movements: ____________ Start time: ____________ Doreatha Martin time: ____________ Date: ____________ Movements: ____________ Start time: ____________ Doreatha Martin time: ____________ Date: ____________ Movements: ____________ Start time: ____________ Doreatha Martin time: ____________ Date: ____________ Movements: ____________ Start time: ____________ Doreatha Martin time: ____________  Date: ____________ Movements: ____________ Start time: ____________ Doreatha Martin time: ____________ Date: ____________ Movements: ____________ Start time: ____________ Doreatha Martin time: ____________ Date: ____________ Movements: ____________ Start time: ____________ Doreatha Martin time: ____________ Date: ____________ Movements: ____________ Start time: ____________ Doreatha Martin time: ____________ Date: ____________ Movements: ____________ Start time: ____________ Doreatha Martin time: ____________ Date: ____________ Movements: ____________ Start time: ____________ Doreatha Martin time: ____________ Date: ____________ Movements: ____________ Start time:  ____________ Doreatha Martin time: ____________  Date: ____________ Movements: ____________ Start time: ____________ Doreatha Martin time: ____________ Date: ____________ Movements: ____________ Start time: ____________ Doreatha Martin time: ____________ Date: ____________ Movements: ____________ Start time: ____________ Doreatha Martin time: ____________ Date: ____________ Movements: ____________ Start time: ____________ Doreatha Martin time: ____________ Date: ____________ Movements: ____________ Start time: ____________ Doreatha Martin time: ____________ Date: ____________ Movements: ____________ Start time: ____________ Doreatha Martin time: ____________ Date: ____________ Movements: ____________ Start time: ____________ Doreatha Martin time: ____________  Date: ____________ Movements: ____________ Start time: ____________ Doreatha Martin time: ____________ Date: ____________ Movements: ____________ Start time: ____________ Doreatha Martin time: ____________ Date: ____________ Movements: ____________ Start time: ____________ Doreatha Martin time: ____________ Date: ____________ Movements: ____________ Start time: ____________ Doreatha Martin time: ____________ Date: ____________ Movements: ____________ Start time: ____________ Doreatha Martin time: ____________ Date: ____________ Movements: ____________ Start time: ____________ Doreatha Martin time: ____________ Date: ____________ Movements: ____________ Start time: ____________ Doreatha Martin time: ____________  Date: ____________ Movements: ____________ Start time: ____________ Doreatha Martin time: ____________ Date: ____________ Movements: ____________ Start time: ____________ Doreatha Martin time: ____________ Date: ____________ Movements: ____________ Start time: ____________ Doreatha Martin time: ____________ Date: ____________ Movements: ____________ Start time: ____________ Doreatha Martin time: ____________ Date: ____________ Movements: ____________ Start time: ____________ Doreatha Martin time: ____________ Date: ____________ Movements: ____________ Start time: ____________ Doreatha Martin time: ____________ Date:  ____________ Movements: ____________ Start time: ____________ Doreatha Martin time: ____________  Date: ____________ Movements: ____________ Start time: ____________ Doreatha Martin time: ____________ Date: ____________ Movements: ____________ Start time: ____________ Doreatha Martin time: ____________ Date: ____________ Movements: ____________ Start time: ____________ Doreatha Martin time: ____________ Date: ____________ Movements: ____________ Start time: ____________ Doreatha Martin time: ____________ Date: ____________ Movements: ____________ Start time: ____________ Doreatha Martin time: ____________ Date: ____________ Movements: ____________ Start time: ____________ Doreatha Martin time: ____________ Date: ____________ Movements: ____________ Start time: ____________ Doreatha Martin time: ____________  Date: ____________ Movements: ____________ Start time: ____________  Finish time: ____________ Date: ____________ Movements: ____________ Start time: ____________ Doreatha Martin time: ____________ Date: ____________ Movements: ____________ Start time: ____________ Doreatha Martin time: ____________ Date: ____________ Movements: ____________ Start time: ____________ Doreatha Martin time: ____________ Date: ____________ Movements: ____________ Start time: ____________ Doreatha Martin time: ____________ Date: ____________ Movements: ____________ Start time: ____________ Doreatha Martin time: ____________ Document Released: 01/21/2006 Document Revised: 05/08/2013 Document Reviewed: 10/19/2011 [employer] Patient Information 2015 [place]. This information is not intended to replace advice given to you by your health care provider. Make sure you discuss any questions you have with your health care provider. Braxton Hicks Contractions Contractions of the uterus can occur throughout pregnancy. Contractions are not always a sign that you are in labor.  WHAT ARE BRAXTON HICKS CONTRACTIONS?  Contractions that occur before labor are called Braxton Hicks contractions, or false labor. Toward the end of pregnancy  (32-34 weeks), these contractions can develop more often and may become more forceful. This is not true labor because these contractions do not result in opening (dilatation) and thinning of the cervix. They are sometimes difficult to tell apart from true labor because these contractions can be forceful and people have different pain tolerances. You should not feel embarrassed if you go to the hospital with false labor. Sometimes, the only way to tell if you are in true labor is for your health care provider to look for changes in the cervix. If there are no prenatal problems or other health problems associated with the pregnancy, it is completely safe to be sent home with false labor and await the onset of true labor. HOW CAN YOU TELL THE DIFFERENCE BETWEEN TRUE AND FALSE LABOR? False Labor  The contractions of false labor are usually shorter and not as hard as those of true labor.   The contractions are usually irregular.   The contractions are often felt in the front of the lower abdomen and in the groin.   The contractions may go away when you walk around or change positions while lying down.   The contractions get weaker and are shorter lasting as time goes on.   The contractions do not usually become progressively stronger, regular, and closer together as with true labor.  True Labor  Contractions in true labor last 30-70 seconds, become very regular, usually become more intense, and increase in frequency.   The contractions do not go away with walking.   The discomfort is usually felt in the top of the uterus and spreads to the lower abdomen and low back.   True labor can be determined by your health care provider with an exam. This will show that the cervix is dilating and getting thinner.  WHAT TO REMEMBER  Keep up with your usual exercises and follow other instructions given by your health care provider.   Take medicines as directed by your health care provider.    Keep your regular prenatal appointments.   Eat and drink lightly if you think you are going into labor.   If Braxton Hicks contractions are making you uncomfortable:   Change your position from lying down or resting to walking, or from walking to resting.   Sit and rest in a tub of warm water.   Drink 2-3 glasses of water. Dehydration may cause these contractions.   Do slow and deep breathing several times an hour.  WHEN SHOULD I SEEK IMMEDIATE MEDICAL CARE? Seek immediate medical care if:  Your contractions become stronger, more regular, and closer together.   You have fluid leaking or  gushing from your vagina.   You have a fever.   You pass blood-tinged mucus.   You have vaginal bleeding.   You have continuous abdominal pain.   You have low back pain that you never had before.   You feel your baby's head pushing down and causing pelvic pressure.   Your baby is not moving as much as it used to.  Document Released: 12/22/2004 Document Revised: 12/27/2012 Document Reviewed: 10/03/2012 [facility] Patient Information 2015 [place]. This information is not intended to replace advice given to you by your health care provider. Make sure you discuss any questions you have with your health care provider.

## 2014-06-03 NOTE — Anesthesia Preprocedure Evaluation (Signed)
Anesthesia Evaluation  Patient identified by MRN, date of birth, ID band Patient awake    Reviewed: Allergy & Precautions, NPO status , Patient's Chart, lab work & pertinent test results  History of Anesthesia Complications Negative for: history of anesthetic complications  Airway Mallampati: II  TM Distance: >3 FB Neck ROM: Full    Dental  (+) Teeth Intact   Pulmonary neg shortness of breath, neg sleep apnea, neg COPDneg recent URI, former smoker,  breath sounds clear to auscultation        Cardiovascular negative cardio ROS  Rhythm:Regular     Neuro/Psych negative neurological ROS  negative psych ROS   GI/Hepatic negative GI ROS, Neg liver ROS,   Endo/Other  negative endocrine ROS  Renal/GU negative Renal ROS     Musculoskeletal   Abdominal   Peds  Hematology negative hematology ROS (+)   Anesthesia Other Findings   Reproductive/Obstetrics (+) Pregnancy                             Anesthesia Physical Anesthesia Plan  ASA: II  Anesthesia Plan: Epidural   Post-op Pain Management:    Induction:   Airway Management Planned:   Additional Equipment:   Intra-op Plan:   Post-operative Plan:   Informed Consent: I have reviewed the patients History and Physical, chart, labs and discussed the procedure including the risks, benefits and alternatives for the proposed anesthesia with the patient or authorized representative who has indicated his/her understanding and acceptance.     Plan Discussed with: Anesthesiologist  Anesthesia Plan Comments:         Anesthesia Quick Evaluation

## 2014-06-03 NOTE — Progress Notes (Signed)
Wet linens changed with light mec stained

## 2014-06-03 NOTE — MAU Note (Signed)
Pt presents to MAU with complaints of contractions that have gotten stronger throughout the night. Was evaluated in MAU x 2 yesterday for contractions with no change in cervix. Pt denies any vaginal bleeding or LOF

## 2014-06-03 NOTE — Anesthesia Procedure Notes (Signed)
Epidural Patient location during procedure: OB  Staffing Anesthesiologist: Deral Schellenberg, CHRIS Performed by: anesthesiologist   Preanesthetic Checklist Completed: patient identified, surgical consent, pre-op evaluation, timeout performed, IV checked, risks and benefits discussed and monitors and equipment checked  Epidural Patient position: sitting Prep: site prepped and draped and DuraPrep Patient monitoring: heart rate, cardiac monitor, continuous pulse ox and blood pressure Approach: midline Location: L3-L4 Injection technique: LOR saline  Needle:  Needle type: Tuohy  Needle gauge: 17 G Needle length: 9 cm Needle insertion depth: 7 cm Catheter type: closed end flexible Catheter size: 19 Gauge Catheter at skin depth: 13 cm Test dose: negative and 2% lidocaine with Epi 1:200 K  Assessment Events: blood not aspirated, injection not painful, no injection resistance, negative IV test and no paresthesia  Additional Notes H+P and labs checked, risks and benefits discussed with the patient, consent obtained, procedure tolerated well and without complications.  Reason for block:procedure for pain   

## 2014-06-03 NOTE — Progress Notes (Signed)
Evalee MuttonLaurie Clemmons CNM at the bedside discussing plan of care

## 2014-06-03 NOTE — Progress Notes (Signed)
Patricia Warren is a 19 y.o. G1P0 at 3520w1d by ultrasound admitted for non reassurring FHT's  Subjective: Pt is comfortble with epdural. Pitocin was dc'ed at approx 130pm after prolonged decels. Baby recovered to baseline and was reassurring.  Objective: BP 129/75 mmHg  Pulse 109  Temp(Src) 98.4 F (36.9 C) (Oral)  Resp 20  Ht 5\' 8"  (1.727 m)  Wt 92.987 kg (205 lb)  BMI 31.18 kg/m2  SpO2 100%  LMP 08/26/2013      FHT:  FHR: 150 bpm, variability: minimal ,  accelerations:  Abscent,  decelerations:  Present after placement of arom, fse and iupc UC:   irregular, every 6 minutes SVE:   Dilation: 4 Effacement (%): 90 Station: -2 Exam by:: Corrie DandyMary Early RN  Dilation: 5 Effacement (%): 90 Cervical Position: Middle Station: -2 Presentation: Vertex Exam by:: [employer]Lori Clemmons  Labs: Lab Results  Component Value Date   WBC 14.4* 06/03/2014   HGB 12.0 06/03/2014   HCT 34.2* 06/03/2014   MCV 87.0 06/03/2014   PLT 186 06/03/2014     Assessment / Plan  IUP @ 40+1 Oligohydramnios AFI 4.3  Terbutaline IUPC- amnioinfusion Will proceed to C/S if fetal intolerance to labor continues.  Dr Shawnie PonsPratt consulted.     :Leeroy Chalemmons,Lori Grissett 06/03/2014, 2:51 PM

## 2014-06-03 NOTE — Progress Notes (Signed)
CNM calling for pt update, VE, FHR tracing reviewed with CNM. No orders at this time

## 2014-06-03 NOTE — H&P (Signed)
LABOR ADMISSION HISTORY AND PHYSICAL  Patricia Warren is a 19 y.o. female G1P0 with IUP at [redacted]w[redacted]d by [place] presenting in early labor and non reassurring fht's in triage. She reports +FMs, No LOF, no VB, no blurry vision, headaches, peripheral edema, or RUQ pain.  She plans on breast and bottle feeding. She is unsure about contraception but would like more information after the baby is born.  Dating: By [place] --->  Estimated Date of Delivery: 06/02/14  Sono:    , CWD, normal anatomy, cephalic presentation, oligohydramnios with AFI<5cm  CWD, 1101 g, 27% EFW US Fetal Bpp W/o Non Stress  06/02/2014   OBSTETRICAL ULTRASOUND: This exam was performed within a [facility]. The OB US report was generated in the AS system, and faxed to the ordering physician.   This report is available in the [employer]. See the AS Obstetric US report via the Image Link. 8/8 AFI 4.3- Oligohydramnios  Prenatal History/Complications:  Past Medical History: Past Medical History  Diagnosis Date  . Mild obesity   . Smoker   . Medical history non-contributory     Past Surgical History: Past Surgical History  Procedure Laterality Date  . No past surgeries      Obstetrical History: OB History    Gravida Para Term Preterm AB TAB SAB Ectopic Multiple Living   1         0      Social History: History   Social History  . Marital Status: Single    Spouse Name: N/A  . Number of Children: N/A  . Years of Education: N/A   Social History Main Topics  . Smoking status: Former Smoker -- 0.25 packs/day    Types: Cigarettes    Quit date: 05/04/2014  . Smokeless tobacco: Never Used  . Alcohol Use: No  . Drug Use: No  . Sexual Activity: Yes   Other Topics Concern  . None   Social History Narrative    Family History: Family History  Problem Relation Age of Onset  . Asthma Brother     Allergies: No Known Allergies  Prescriptions prior to admission  Medication Sig Dispense  Refill Last Dose  . acetaminophen (TYLENOL) 500 MG tablet Take 1,000 mg by mouth every 6 (six) hours as needed for moderate pain or headache.   prn at Unknown time  . Prenatal Vit-Fe Fumarate-FA (PRENATAL VITAMINS) 28-0.8 MG TABS Take 28 mg by mouth daily with breakfast. 30 tablet 4 Past Week at Unknown time     Review of Systems   All systems reviewed and negative except as stated in HPI  Blood pressure 140/79, pulse 89, temperature 98.4 F (36.9 C), temperature source Oral, resp. rate 20, height  (1.727 m), weight 92.987 kg (205 lb), last menstrual period 08/26/2013, SpO2 100 %. General appearance: alert, cooperative and no distress Lungs: clear to auscultation bilaterally Heart: regular rate and rhythm Abdomen: soft, non-tender; bowel sounds normal Extremities: Homans sign is negative, no sign of DVT Presentation: unsure Fetal monitoringBaseline: 130 bpm, Variability: Good {> 6 bpm), Accelerations: Reactive and Decelerations: Variable: moderate Uterine activityFrequency: Every 10 minutes, Duration: 40 seconds and Intensity: mild Dilation: 3 Effacement (%): 90 Station: -2 Exam by:: Lynda Rainwater RN    Prenatal labs: ABO, Rh: --/--/O POS (05/29 1005) Antibody: NEG (05/29 1005) Rubella:   RPR: NON REAC (03/09 1302)  HBsAg: NEGATIVE (03/09 1302)  HIV: NONREACTIVE (03/09 1302)  GBS: Negative (05/18 0000)  1 hr Glucola: 73 Genetic screening:  too late to prenatal care Anatomy US: Normal except for size (AC 5%)  Prenatal Transfer Tool  Maternal Diabetes: No Genetic Screening: too late for prenatal care Maternal Ultrasounds/Referrals: Abnormal:  Findings:   Other:Oligohydramnios with AFI < 5 cm Fetal Ultrasounds or other Referrals:  None Maternal Substance Abuse:  Yes:  Type: Smoker, Marijuana Significant Maternal Medications:  None Significant Maternal Lab Results: None  Results for orders placed or performed during the hospital encounter of 06/03/14 (from the past 24  hour(s))  CBC   Collection Time: 06/03/14 10:05 AM  Result Value Ref Range   WBC 14.4 (H) 4.0 - 10.5 K/uL   RBC 3.93 3.87 - 5.11 MIL/uL   Hemoglobin 12.0 12.0 - 15.0 g/dL   HCT 09.834.2 (L) 11.936.0 - 14.746.0 %   MCV 87.0 78.0 - 100.0 fL   MCH 30.5 26.0 - 34.0 pg   MCHC 35.1 30.0 - 36.0 g/dL   RDW 82.913.8 56.211.5 - 13.015.5 %   Platelets 186 150 - 400 K/uL  Type and screen   Collection Time: 06/03/14 10:05 AM  Result Value Ref Range   ABO/RH(D) O POS    Antibody Screen NEG    Sample Expiration 06/06/2014     Patient Active Problem List   Diagnosis Date Noted  . Labor and delivery indication for care or intervention 06/03/2014  . Insufficient prenatal care in third trimester 05/30/2014  . Marijuana use 03/20/2014  . Poor fetal growth affecting management of mother in third trimester   . Supervision of normal pregnancy in second trimester 03/14/2014  . Smoker     Assessment: Patricia Warren is a 19 y.o. G1P0 at 5929w1d here for labor. Early labor Non reassurring FHt's Oligohydramnios IUP @ 40+1  #Labor: Pitocin/ IUPC/ FSE/amnioinfusion #Pain: Epidural #FWB: Cat II #ID:  GBS neg #MOF: Breast + Bottle #MOC: unknown  Patricia Warren CNM 06/03/2014 1510

## 2014-06-03 NOTE — Progress Notes (Signed)
Lori Clemmons CNM notified of FHR strip

## 2014-06-03 NOTE — Progress Notes (Signed)
Towels at perineum changed with mod amt of mec stained fluid

## 2014-06-04 LAB — CBC
HEMATOCRIT: 29.4 % — AB (ref 36.0–46.0)
HEMOGLOBIN: 10.3 g/dL — AB (ref 12.0–15.0)
MCH: 30.8 pg (ref 26.0–34.0)
MCHC: 35 g/dL (ref 30.0–36.0)
MCV: 88 fL (ref 78.0–100.0)
Platelets: 164 10*3/uL (ref 150–400)
RBC: 3.34 MIL/uL — AB (ref 3.87–5.11)
RDW: 13.9 % (ref 11.5–15.5)
WBC: 13 10*3/uL — ABNORMAL HIGH (ref 4.0–10.5)

## 2014-06-04 LAB — RPR: RPR: NONREACTIVE

## 2014-06-04 MED ORDER — DIBUCAINE 1 % RE OINT: 1.0000 "application " | TOPICAL_OINTMENT | RECTAL | Status: DC | PRN

## 2014-06-04 MED ORDER — LANOLIN HYDROUS EX OINT: TOPICAL_OINTMENT | CUTANEOUS | Status: DC | PRN

## 2014-06-04 MED ORDER — TETANUS-DIPHTH-ACELL PERTUSSIS 5-2.5-18.5 LF-MCG/0.5 IM SUSP
0.5000 mL | Freq: Once | INTRAMUSCULAR | Status: AC
  Administered 2014-06-05: 0.5 mL via INTRAMUSCULAR

## 2014-06-04 MED ORDER — SENNOSIDES-DOCUSATE SODIUM 8.6-50 MG PO TABS
2.0000 | ORAL_TABLET | ORAL | Status: DC
  Administered 2014-06-05: 2 via ORAL
  Filled 2014-06-04: qty 2

## 2014-06-04 MED ORDER — BENZOCAINE-MENTHOL 20-0.5 % EX AERO: 1.0000 "application " | INHALATION_SPRAY | CUTANEOUS | Status: DC | PRN

## 2014-06-04 MED ORDER — ONDANSETRON HCL 4 MG PO TABS: 4.0000 mg | ORAL_TABLET | ORAL | Status: DC | PRN

## 2014-06-04 MED ORDER — DIPHENHYDRAMINE HCL 25 MG PO CAPS: 25.0000 mg | ORAL_CAPSULE | Freq: Four times a day (QID) | ORAL | Status: DC | PRN

## 2014-06-04 MED ORDER — SIMETHICONE 80 MG PO CHEW: 80.0000 mg | CHEWABLE_TABLET | ORAL | Status: DC | PRN

## 2014-06-04 MED ORDER — ZOLPIDEM TARTRATE 5 MG PO TABS: 5.0000 mg | ORAL_TABLET | Freq: Every evening | ORAL | Status: DC | PRN

## 2014-06-04 MED ORDER — ACETAMINOPHEN 325 MG PO TABS: 650.0000 mg | ORAL_TABLET | ORAL | Status: DC | PRN

## 2014-06-04 MED ORDER — OXYCODONE-ACETAMINOPHEN 5-325 MG PO TABS
1.0000 | ORAL_TABLET | ORAL | Status: DC | PRN
  Administered 2014-06-04 (×3): 1 via ORAL
  Filled 2014-06-04 (×3): qty 1

## 2014-06-04 MED ORDER — OXYCODONE-ACETAMINOPHEN 5-325 MG PO TABS
2.0000 | ORAL_TABLET | ORAL | Status: DC | PRN
  Administered 2014-06-04: 2 via ORAL
  Filled 2014-06-04: qty 2

## 2014-06-04 MED ORDER — PRENATAL MULTIVITAMIN CH
1.0000 | ORAL_TABLET | Freq: Every day | ORAL | Status: DC
  Administered 2014-06-04 – 2014-06-05 (×2): 1 via ORAL
  Filled 2014-06-04 (×2): qty 1

## 2014-06-04 MED ORDER — ONDANSETRON HCL 4 MG/2ML IJ SOLN
4.0000 mg | INTRAMUSCULAR | Status: DC | PRN
Start: 2014-06-04 — End: 2014-06-05

## 2014-06-04 MED ORDER — WITCH HAZEL-GLYCERIN EX PADS: 1.0000 "application " | MEDICATED_PAD | CUTANEOUS | Status: DC | PRN

## 2014-06-04 NOTE — Discharge Instructions (Signed)

## 2014-06-04 NOTE — Progress Notes (Signed)
Post Partum Day #1 Subjective: no complaints, up ad lib, voiding and + flatus, hasn't had regular meal yet  Objective: Blood pressure 134/73, pulse 93, temperature 97.7 F (36.5 C), temperature source Axillary, resp. rate 18, height 5\' 8"  (1.727 m), weight 92.987 kg (205 lb), last menstrual period 08/26/2013, SpO2 100 %, unknown if currently breastfeeding.  Physical Exam:  General: alert and cooperative, tired but well-appearing, NAD Lochia: appropriate Uterine Fundus: firm DVT Evaluation: No evidence of DVT seen on physical exam. Negative Homan's sign. No cords or calf tenderness. No significant calf/ankle edema.   Recent Labs  06/03/14 1005 06/04/14 0605  HGB 12.0 10.3*  HCT 34.2* 29.4*    Assessment/Plan: Patricia Warren is a 19 y.o. G1P1001 at 4148w1d s/p NSVD healthy baby girl on 5/29 at 2117. - Pregnancy complicated by oligohydramnios, during labor had NRFHT x 4 min decel, required amnioinfusion with improved FHT - Maternal GBS negative - Postpartum course uncomplicated. - Pain controlled, bowel regimen PRN - Continue breast / bottle feeding, lactation consult as needed - Contraception: undecided - Anticipate discharge tomorrow on PPD#2   LOS: 1 day   Saralyn PilarAlexander Karamalegos, DO [facility], PGY-2 06/04/2014, 7:33 AM  Seen also by me Doing well with breastfeeding Agree with note Patricia Warren, CNM

## 2014-06-04 NOTE — Progress Notes (Signed)
CLINICAL SOCIAL WORK MATERNAL/CHILD NOTE  Patient Details  Name: Patricia Warren MRN: [id number] Date of Birth: 06/03/2016  Date:  06/04/2014  Clinical Social Worker Initiating Note:  Loleta Books, LCSW Date/ Time Initiated:  06/04/14/1115     Child's Name:  Patricia Warren   Legal Guardian: Irwin Brakeman (mother) and Smitty Cords (father)  Need for Interpreter:  None   Date of Referral:  06/03/14     Reason for Referral:   1) Current Substance Use/Substance Use During Pregnancy: THC use 2)Late or No Prenatal Care: MOB initiated care at 28 weeks and 4 days.  Referral Source:  [facility]   Address:  [address] [place], [place] [address]  Phone number:  [phone]   Household Members:  MOB stated that she lives with the FOB and the FOB's cousin. She identified them as positive and strong supports.  Natural Supports (not living in the home):  Extended Family, mother, FOB's family   Professional Supports: None   Employment: Unemployed .  MOB stated that both she and the FOB are unemployed. She reported intention to secure employment after 6 weeks.  She stated that the FOB's cousin is supporting them financially.   Type of Work:   N/A  Education:    N/A  Architect:  Medicaid   Other Resources:  Sales executive , [employer]   Cultural/Religious Considerations Which May Impact Care:  None reported  Strengths:  Ability to meet basic needs , Home prepared for child    Risk Factors/Current Problems:   1)Family/Relationship Issues: MOB endorsed history of strained relationships with her mother and other family members. She stated that she moved to [place] during the pregnancy in order to live with her father (to reduce stress since she was living with her mother), but found that this relationship also contributed to stress.  She stated that she and the FOB moved in with the FOB's cousin in [place] approximately 2 weeks ago.  2) Financial stress: Due to unemployment of MOB and  FOB 3) LPNC: Care initiated at [redacted]w[redacted]d due to difficulties obtaining Medicaid. 4) Marijuana use during pregnancy: MOB reported last use "months" ago. Infant UDS and MDS are pending.  5) Mental health concerns: MOB endorsed significant anxiety during the pregnancy due to psychosocial stressors.  Cognitive State:  Able to Concentrate , Alert , Goal Oriented , Linear Thinking    Mood/Affect:  Calm , Interested , Bright    CSW Assessment:  CSW received referral [facility] to [facility] and THC use during pregnancy.  MOB presented as easily engaged and receptive to the visit despite reporting that she is tired and "exhausted".  Mood and affect appeared appropriate to the situation, and she was noted to be smiling brightly when she was looking at and talking about the infant. No acute mental health symptoms observed.  CSW provided support and assisted the MOB to process her thoughts and feelings as she transitions to the postpartum period.  MOB shared that labor and delivery was "easier" than anticipated, and expressed intense happiness associated with becoming a mother.  Per MOB, she had limited interest in breastfeeding, but became receptive to "trying" toward the end of the pregnancy.  She stated that she continues to be unsure how she feels about breastfeeding but wants to try due to health benefits for the baby.  MOB acknowledged importance of figuring how she wants to feed the infant since it will be important for her to feel comfortable with her decision.   MOB identified feeling  nervous and anxious during the pregnancy due to numerous psychosocial stressors. She stated that she was worried about becoming a parent, and endorsed significant financial stress and strained relationships with her parents and the FOB's mother.  MOB indicated ruminating on anxious thoughts, and shared that it was difficult for her to feel at "ease" or to sleep.  She also reported that she experienced angry feelings.  She stated that  she has been told by family and friends that she should "see someone" to help her with her feelings, but she expressed a fear of being seen as "crazy".  MOB presented as more at ease when CSW normalized and validated her feelings.  CSW assisted the MOB to look forward on potential impact if mental health symptoms are left untreated.  MOB presented with insight on importance of addressing mental health needs as she transitions to the postpartum period.  She appears to be contemplating initiating mental health care, but is not yet ready to start therapy.  MOB was receptive to receiving CSW contact information with whom she can contact in the future if needed. CSW provided education on perinatal mood and anxiety disorders, and discussed the increased risk factors that MOB presents with due to feelings during the pregnancy and her psychosocial stressors.  MOB verbalized understanding, and acknowledged that there are options to help her treatment symptoms if desired.  MOB acknowledged THC use during the pregnancy. She denied additional substance use, but was a limited historian when CSW attempted to gather information on last use. MOB reported last use was "months ago".  MOB verbalized understanding of the hospital drug screen policy, but expressed fear of CPS involvement if MDS is positive.  CSW validated her feelings and provided education on what to expect with CPS involvement due to [facility] use.    CSW Plan/Description: 1)Patient/Family Education: Perinatal mood and anxiety disorders 2)Information/Referral to [employer] Resources: MOB declined mental health referrals at this time out of fear of being labeled as "crazy" MOB was agreeable to contacting CSW if she needs referral information after discharge. 3) CSW to monitor UDS and MDS and will notify CPS if positive.  3)No Further Intervention Required/No Barriers to Discharge     Kelby FamVenning, Heinrich Fertig N, LCSW 06/04/2014, 12:02 PM

## 2014-06-04 NOTE — Lactation Note (Signed)
This note was copied from the chart of Patricia Warren. Lactation Consultation Note  Assisted first time mother with positioning.  Baby latched easily and suckled rhythmically.  I noted some dimpling with feeding but swallows were heard.  Mom quickly learned hand expression and colostrum was visible.  Baby was spoon fed about 1 ml.  A double electric breast pump was set-up at the bedside. Mom was instructed on assembling, disassembling and use of breast pump.  I encouraged her to pump 4-6 times in 24 hour and to hand express after all feedings.  Any expressed colostrum will be fed back to the baby. Information given on support groups and outpatient services.  Patient Name: Patricia Irwin BrakemanSativa Wingate [id number]'WToday's Date: 06/04/2014 Reason for consult: Initial assessment;Infant < 6lbs;Hyperbilirubinemia   Maternal Data Has patient been taught Hand Expression?: Yes Does the patient have breastfeeding experience prior to this delivery?: No  Feeding Feeding Type: Breast Fed Length of feed: 10 min  LATCH Score/Interventions Latch: Grasps breast easily, tongue down, lips flanged, rhythmical sucking. Intervention(s): Skin to skin  Audible Swallowing: Spontaneous and intermittent  Type of Nipple: Everted at rest and after stimulation  Comfort (Breast/Nipple): Soft / non-tender     Hold (Positioning): Assistance needed to correctly position infant at breast and maintain latch.  LATCH Score: 9  Lactation Tools Discussed/Used Pump Review: Setup, frequency, and cleaning Initiated by:: Lactation Consultant Date initiated:: 06/04/14   Consult Status Consult Status: Follow-up Date: 06/05/14 Follow-up type: In-patient    Soyla DryerJoseph, Celicia Minahan 06/04/2014, 9:52 AM

## 2014-06-04 NOTE — Anesthesia Postprocedure Evaluation (Signed)
Anesthesia Post Note  Patient: Patricia BrakemanSativa Warren  Procedure(s) Performed: * No procedures listed *  Anesthesia type: Epidural  Patient location: Mother/Baby  Post pain: Pain level controlled  Post assessment: Post-op Vital signs reviewed  Last Vitals:  Filed Vitals:   06/04/14 0455  BP: 134/73  Pulse: 93  Temp: 36.5 C  Resp: 18    Post vital signs: Reviewed  Level of consciousness:alert  Complications: No apparent anesthesia complications

## 2014-06-05 ENCOUNTER — Ambulatory Visit (HOSPITAL_COMMUNITY): Payer: Medicaid Other | Attending: Certified Nurse Midwife

## 2014-06-05 MED ORDER — DOCUSATE SODIUM 100 MG PO CAPS: 100.0000 mg | ORAL_CAPSULE | Freq: Two times a day (BID) | ORAL | Status: DC

## 2014-06-05 MED ORDER — IBUPROFEN 600 MG PO TABS: 600.0000 mg | ORAL_TABLET | Freq: Four times a day (QID) | ORAL | Status: DC
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
## 2014-06-05 NOTE — Discharge Summary (Signed)
Obstetric Discharge Summary Reason for Admission: onset of early labor, NRFHT with prolonged variable decels in triage Prenatal Procedures: NST and ultrasound Intrapartum Procedures: spontaneous vaginal delivery Postpartum Procedures: none Complications-Operative and Postpartum: none HEMOGLOBIN  Date Value Ref Range Status  06/04/2014 10.3* 12.0 - 15.0 g/dL Final   HCT  Date Value Ref Range Status  06/04/2014 29.4* 36.0 - 46.0 % Final    Discharge Diagnoses: Term Pregnancy-delivered  Hospital Course:  Irwin BrakemanSativa Mccartin is a 19 y.o. G1P1001 at 3139w1d who was admitted on 06/03/14 for SOL (early) with concern for NRFHT prolonged variable decels, intact membranes. Pregnancy was complicated with identified oligohydramnios on 40wk US. Maternal GBS negative. Progressed to vaginal delivery within 12 hours (see copied delivery note below). Postpartum course was uncomplicated, tolerating PO and ambulation, pain controlled, bleeding improved, +flatus (no BM), and no barriers to discharge. Plan for continue breast feeding (1st time mother), contraception considering Mirena IUD, follow-up in 4-6 weeks for postpartum visit.  Of note, baby had jaundice required double phototherapy.  Delivery Note At 9:17 PM a viable female was delivered via (Presentation: ROA). APGAR: 9, 9; weight (pending).  Placenta status: Intact, . Cord: 3v with the following complications: none  Anesthesia: Epidural Episiotomy: None Lacerations: None Suture Repair: None Est. Blood Loss (mL): about 200cc   Mom to postpartum. Baby to Couplet care / Skin to Skin.  Upon arrival patient was complete and pushing. She pushed with good maternal effort to deliver a healthy baby girl, delivered without difficulty, good tone and placed on maternal abdomen for oral suctioning, drying and stimulation. Delayed cord clamping performed and cut by FOB. Placenta delivered intact with 3V cord. Vaginal canal and perineum was inspected and  intact. Pitocin was started and uterus massaged until bleeding slowed. Counts of sharps, instruments, and lap pads were all correct.   Saralyn PilarAlexander Karamalegos, DO [facility], PGY-2 06/03/2014, 9:32 PM   I was gloved and present for entire delivery. Agree with note No difficulty with shoulders Aviva SignsMarie L Williams, CNM  ------------------------------------ Physical Exam:  General: alert and cooperative, well-appearing, comfortable, NAD Lochia: appropriate Uterine Fundus: firm DVT Evaluation: No evidence of DVT seen on physical exam. Negative Homan's sign. No cords or calf tenderness. No significant calf/ankle edema.  Discharge Information: Date: 06/05/2014 Activity: pelvic rest Diet: routine Medications: PNV, Ibuprofen and Colace Baby feeding: plans to breastfeed Contraception: considering Mirena IUD Condition: stable Instructions: refer to practice specific booklet Discharge to: home Follow-up Information    [facility] Schedule an appointment as soon as possible for a visit in 4 weeks.   Specialty:  Obstetrics and Gynecology   Why:  for post-partum follow-up 4-6 weeks   Contact information:   [address] [place] [id number] [phone]      Newborn Data: Live born female  Birth Weight: 5 lb 6.8 oz (2461 g) APGAR: 9, 9  Anticipated discharge home with mother, once cleared for discharge by nursery, may be additional day.  Saralyn PilarAlexander Karamalegos, DO [facility], PGY-2 06/05/2014, 7:23 AM   CNM attestation I have seen and examined this patient and agree with above documentation in the resident's note.   Irwin BrakemanSativa Lindvall is a 19 y.o. G1P1001 s/p SVD.   Pain is well controlled.  Plan for birth control is IUD.  Method of Feeding: breast  PE:  BP 112/56 mmHg  Pulse 79  Temp(Src) 98.5 F (36.9 C) (Oral)  Resp 17  Ht 5\' 8"  (1.727 m)  Wt 92.987 kg (205 lb)  BMI 31.18 kg/m2  SpO2 100%  LMP  08/26/2013  Breastfeeding? Unknown Heart: RRR Lungs: nl effort Fundus firm   Recent Labs  06/03/14 1005 06/04/14 0605  HGB 12.0 10.3*  HCT 34.2* 29.4*     Plan: discharge today - postpartum care discussed - f/u clinic in 6 weeks for postpartum visit   Dawid Dupriest, CNM 9:08 AM

## 2014-06-06 ENCOUNTER — Encounter: Payer: Self-pay | Admitting: Family

## 2014-07-03 ENCOUNTER — Encounter: Payer: Self-pay | Admitting: General Practice

## 2014-07-11 ENCOUNTER — Ambulatory Visit (INDEPENDENT_AMBULATORY_CARE_PROVIDER_SITE_OTHER): Payer: Medicaid Other | Admitting: Obstetrics and Gynecology

## 2014-07-11 NOTE — Progress Notes (Signed)
  Subjective:     Patricia Warren is a 19 y.o. female who presents for a postpartum visit. She is 5 weeks postpartum following a spontaneous vaginal delivery. I have fully reviewed the prenatal and intrapartum course. The delivery was at 40 gestational weeks. Outcome: spontaneous vaginal delivery. Anesthesia: epidural. Postpartum course has been uncomplicated. Baby's course has been uncomplicated. Baby is feeding by bottle - Similac Advance. Bleeding no bleeding. Bowel function is normal. Bladder function is normal. Patient is sexually active without condoms. She is not planning on conceiving. Contraception method is none. Postpartum depression screening: negative.     Review of Systems Pertinent items are noted in HPI.   Objective:    There were no vitals taken for this visit.  General:  alert, cooperative and no distress   Breasts:  inspection negative, no nipple discharge or bleeding, no masses or nodularity palpable  Lungs: clear to auscultation bilaterally  Heart:  regular rate and rhythm  Abdomen: soft, non-tender; bowel sounds normal; no masses,  no organomegaly   Vulva:  normal  Vagina: normal vagina, no discharge, exudate, lesion, or erythema  Cervix:  multiparous appearance  Corpus: normal size, contour, position, consistency, mobility, non-tender  Adnexa:  no mass, fullness, tenderness  Rectal Exam: Not performed.        Assessment:     Normal postpartum exam. Pap smear not done at today's visit.   Plan:    1. Contraception: condoms and remains undecided. Discussed LARCS and encouraged her to consider one of them 2. Patient is medically cleared to resume all activities of daily living 3. Follow up in: 4 months for annual exam or as needed for contraception.

## 2014-07-11 NOTE — Patient Instructions (Signed)
Intrauterine Device Information An intrauterine device (IUD) is inserted into your uterus to prevent pregnancy. There are two types of IUDs available:   Copper IUD--This type of IUD is wrapped in copper wire and is placed inside the uterus. Copper makes the uterus and fallopian tubes produce a fluid that kills sperm. The copper IUD can stay in place for 10 years.  Hormone IUD--This type of IUD contains the hormone progestin (synthetic progesterone). The hormone thickens the cervical mucus and prevents sperm from entering the uterus. It also thins the uterine lining to prevent implantation of a fertilized egg. The hormone can weaken or kill the sperm that get into the uterus. One type of hormone IUD can stay in place for 5 years, and another type can stay in place for 3 years. Your health care provider will make sure you are a good candidate for a contraceptive IUD. Discuss with your health care provider the possible side effects.  ADVANTAGES OF AN INTRAUTERINE DEVICE  IUDs are highly effective, reversible, long acting, and low maintenance.   There are no estrogen-related side effects.   An IUD can be used when breastfeeding.   IUDs are not associated with weight gain.   The copper IUD works immediately after insertion.   The hormone IUD works right away if inserted within 7 days of your period starting. You will need to use a backup method of birth control for 7 days if the hormone IUD is inserted at any other time in your cycle.  The copper IUD does not interfere with your female hormones.   The hormone IUD can make heavy menstrual periods lighter and decrease cramping.   The hormone IUD can be used for 3 or 5 years.   The copper IUD can be used for 10 years. DISADVANTAGES OF AN INTRAUTERINE DEVICE  The hormone IUD can be associated with irregular bleeding patterns.   The copper IUD can make your menstrual flow heavier and more painful.   You may experience cramping and  vaginal bleeding after insertion.  Document Released: 11/26/2003 Document Revised: 08/24/2012 Document Reviewed: 06/12/2012 [facility] Patient Information 2015 [employer], [place]. This information is not intended to replace advice given to you by your health care provider. Make sure you discuss any questions you have with your health care provider.   Etonogestrel implant What is this medicine? ETONOGESTREL (et oh noe JES trel) is a contraceptive (birth control) device. It is used to prevent pregnancy. It can be used for up to 3 years. This medicine may be used for other purposes; ask your health care provider or pharmacist if you have questions. COMMON BRAND NAME(S): Implanon, Nexplanon What should I tell my health care provider before I take this medicine? They need to know if you have any of these conditions: -abnormal vaginal bleeding -blood vessel disease or blood clots -cancer of the breast, cervix, or liver -depression -diabetes -gallbladder disease -headaches -heart disease or recent heart attack -high blood pressure -high cholesterol -kidney disease -liver disease -renal disease -seizures -tobacco smoker -an unusual or allergic reaction to etonogestrel, other hormones, anesthetics or antiseptics, medicines, foods, dyes, or preservatives -pregnant or trying to get pregnant -breast-feeding How should I use this medicine? This device is inserted just under the skin on the inner side of your upper arm by a health care professional. Talk to your pediatrician regarding the use of this medicine in children. Special care may be needed. Overdosage: If you think you've taken too much of this medicine contact a poison  control center or emergency room at once. Overdosage: If you think you have taken too much of this medicine contact a poison control center or emergency room at once. NOTE: This medicine is only for you. Do not share this medicine with others. What if I miss a dose? This  does not apply. What may interact with this medicine? Do not take this medicine with any of the following medications: -amprenavir -bosentan -fosamprenavir This medicine may also interact with the following medications: -barbiturate medicines for inducing sleep or treating seizures -certain medicines for fungal infections like ketoconazole and itraconazole -griseofulvin -medicines to treat seizures like carbamazepine, felbamate, oxcarbazepine, phenytoin, topiramate -modafinil -phenylbutazone -rifampin -some medicines to treat HIV infection like atazanavir, indinavir, lopinavir, nelfinavir, tipranavir, ritonavir -St. John's wort This list may not describe all possible interactions. Give your health care provider a list of all the medicines, herbs, non-prescription drugs, or dietary supplements you use. Also tell them if you smoke, drink alcohol, or use illegal drugs. Some items may interact with your medicine. What should I watch for while using this medicine? This product does not protect you against HIV infection (AIDS) or other sexually transmitted diseases. You should be able to feel the implant by pressing your fingertips over the skin where it was inserted. Tell your doctor if you cannot feel the implant. What side effects may I notice from receiving this medicine? Side effects that you should report to your doctor or health care professional as soon as possible: -allergic reactions like skin rash, itching or hives, swelling of the face, lips, or tongue -breast lumps -changes in vision -confusion, trouble speaking or understanding -dark urine -depressed mood -general ill feeling or flu-like symptoms -light-colored stools -loss of appetite, nausea -right upper belly pain -severe headaches -severe pain, swelling, or tenderness in the abdomen -shortness of breath, chest pain, swelling in a leg -signs of pregnancy -sudden numbness or weakness of the face, arm or leg -trouble  walking, dizziness, loss of balance or coordination -unusual vaginal bleeding, discharge -unusually weak or tired -yellowing of the eyes or skin Side effects that usually do not require medical attention (Report these to your doctor or health care professional if they continue or are bothersome.): -acne -breast pain -changes in weight -cough -fever or chills -headache -irregular menstrual bleeding -itching, burning, and vaginal discharge -pain or difficulty passing urine -sore throat This list may not describe all possible side effects. Call your doctor for medical advice about side effects. You may report side effects to FDA at 1-800-FDA-1088. Where should I keep my medicine? This drug is given in a hospital or clinic and will not be stored at home. NOTE: This sheet is a summary. It may not cover all possible information. If you have questions about this medicine, talk to your doctor, pharmacist, or health care provider.  2015, [employer]. (2011-06-29 15:37:45)

## 2014-07-29 ENCOUNTER — Encounter (HOSPITAL_COMMUNITY): Payer: Self-pay | Admitting: Nurse Practitioner

## 2014-07-29 ENCOUNTER — Emergency Department (HOSPITAL_COMMUNITY): Admission: EM | Admit: 2014-07-29 | Discharge: 2014-07-29 | Discharge status: Absent without leave | Payer: Self-pay

## 2014-07-29 ENCOUNTER — Emergency Department (HOSPITAL_COMMUNITY)
Admission: EM | Admit: 2014-07-29 | Discharge: 2014-07-29 | Disposition: A | Discharge status: Home / Self Care | Payer: Medicaid Other | Attending: Emergency Medicine | Admitting: Emergency Medicine

## 2014-07-29 DIAGNOSIS — E860 Dehydration: Secondary | ICD-10-CM | POA: Diagnosis not present

## 2014-07-29 DIAGNOSIS — R079 Chest pain, unspecified: Secondary | ICD-10-CM | POA: Diagnosis not present

## 2014-07-29 DIAGNOSIS — Z720 Tobacco use: Secondary | ICD-10-CM | POA: Diagnosis not present

## 2014-07-29 DIAGNOSIS — R55 Syncope and collapse: Secondary | ICD-10-CM | POA: Diagnosis present

## 2014-07-29 DIAGNOSIS — E669 Obesity, unspecified: Secondary | ICD-10-CM | POA: Diagnosis not present

## 2014-07-29 DIAGNOSIS — Z3202 Encounter for pregnancy test, result negative: Secondary | ICD-10-CM | POA: Insufficient documentation

## 2014-07-29 DIAGNOSIS — Z79899 Other long term (current) drug therapy: Secondary | ICD-10-CM | POA: Diagnosis not present

## 2014-07-29 LAB — I-STAT CHEM 8, ED
BUN: 11 mg/dL (ref 6–20)
CHLORIDE: 106 mmol/L (ref 101–111)
Calcium, Ion: 1.15 mmol/L (ref 1.12–1.23)
Creatinine, Ser: 0.8 mg/dL (ref 0.44–1.00)
GLUCOSE: 82 mg/dL (ref 65–99)
HEMATOCRIT: 41 % (ref 36.0–46.0)
Hemoglobin: 13.9 g/dL (ref 12.0–15.0)
POTASSIUM: 3.9 mmol/L (ref 3.5–5.1)
Sodium: 140 mmol/L (ref 135–145)
TCO2: 23 mmol/L (ref 0–100)

## 2014-07-29 LAB — I-STAT BETA HCG BLOOD, ED (MC, WL, AP ONLY): I-stat hCG, quantitative: <5 m[IU]/mL (ref <5)

## 2014-07-29 LAB — I-STAT TROPONIN, ED: Troponin i, poc: 0 ng/mL (ref 0.00–0.08)

## 2014-07-29 MED ORDER — SODIUM CHLORIDE 0.9 % IV BOLUS (SEPSIS)
1000.0000 mL | Freq: Once | INTRAVENOUS | Status: AC
  Administered 2014-07-29: 1000 mL via INTRAVENOUS

## 2014-07-29 NOTE — ED Notes (Signed)
States she came in from being outside in the heat and "passed out." states she fell from standing position onto floor but she does not report any injuries or pain. She is unsure how long the event lasted, states her friend woke her up. She is A&Ox4 now, breathing easily, ambulatory, mAE

## 2014-07-29 NOTE — Discharge Instructions (Signed)
Stay hydrated.   Stay indoors.   Follow up with your doctor.   Return to ER if you have chest pain, shortness of breath, passing out.

## 2014-07-29 NOTE — ED Provider Notes (Signed)
CSN: [id number]     Arrival date & time 07/29/14  1339 History   First MD Initiated Contact with Patient 07/29/14 1355     Chief Complaint  Patient presents with  . Loss of Consciousness     (Consider location/radiation/quality/duration/timing/severity/associated sxs/prior Treatment) The history is provided by the patient.  Patricia Warren is a 19 y.o. female hx of obesity, here with syncope. Patient was walking out in the heat and passed out. Didn't drink much water. Denies chest pain or shortness of breath. Denies head injury. 2 months post partum. Denies vaginal bleeding. No hx of CAD or family hx of early cardiac death.      Past Medical History  Diagnosis Date  . Mild obesity   . Smoker   . Medical history non-contributory    Past Surgical History  Procedure Laterality Date  . No past surgeries     Family History  Problem Relation Age of Onset  . Asthma Brother    History  Substance Use Topics  . Smoking status: Current Every Day Smoker -- 0.25 packs/day    Types: Cigarettes    Last Attempt to Quit: 05/04/2014  . Smokeless tobacco: Never Used  . Alcohol Use: No   OB History    Gravida Para Term Preterm AB TAB SAB Ectopic Multiple Living   0 1     Review of Systems  Cardiovascular: Positive for chest pain.  All other systems reviewed and are negative.     Allergies  Review of patient's allergies indicates no known allergies.  Home Medications   Prior to Admission medications   Medication Sig Start Date End Date Taking? Authorizing Provider  docusate sodium (COLACE) 100 MG capsule Take 1 capsule (100 mg total) by mouth 2 (two) times daily. Patient not taking: Reported on 07/11/2014 06/05/14   Smitty Cords, DO  ibuprofen (ADVIL,MOTRIN) 600 MG tablet Take 1 tablet (600 mg total) by mouth every 6 (six) hours. Patient not taking: Reported on 07/11/2014 06/05/14   Smitty Cords, DO  Prenatal Vit-Fe Fumarate-FA (PRENATAL VITAMINS) 28-0.8  MG TABS Take 28 mg by mouth daily with breakfast. 03/14/14   Joanna Puff, MD   BP 119/74 mmHg  Pulse 65  Temp(Src) 98.4 F (36.9 C) (Oral)  Resp 16  SpO2 100% Physical Exam  Constitutional: She is oriented to person, place, and time. She appears well-developed and well-nourished.  HENT:  Head: Normocephalic.  MM slightly dry   Eyes: Conjunctivae are normal. Pupils are equal, round, and reactive to light.  Neck: Normal range of motion. Neck supple.  Cardiovascular: Normal rate, regular rhythm and normal heart sounds.   Pulmonary/Chest: Effort normal and breath sounds normal. No respiratory distress. She has no wheezes. She has no rales.  Abdominal: Soft. Bowel sounds are normal. She exhibits no distension. There is no tenderness. There is no rebound and no guarding.  Musculoskeletal: Normal range of motion. She exhibits no edema or tenderness.  Neurological: She is alert and oriented to person, place, and time. No cranial nerve deficit. Coordination normal.  Skin: Skin is warm and dry.  Psychiatric: She has a normal mood and affect. Her behavior is normal. Judgment and thought content normal.  Nursing note and vitals reviewed.   ED Course  Procedures (including critical care time) Labs [id number]  I-STAT CHEM 8, ED  I-STAT BETA HCG BLOOD, ED ([facility], [facility], [facility] ONLY)  I-STAT TROPOININ, ED    Imaging  Review No results found.   EKG Interpretation   Date/Time:  Sunday July 29 2014 14:02:35 EDT Ventricular Rate:  78 PR Interval:  138 QRS Duration: 91 QT Interval:  365 QTC Calculation: 416 R Axis:   84 Text Interpretation:  Sinus rhythm No previous ECGs available Confirmed by  Gayla Benn  MD, Kamri Gotsch ([id number]) on 07/29/2014 2:05:30 PM      MDM   Final diagnoses:  None    Patricia Warren is a 19 y.o. female here with syncope. EKG unremarkable. Was walking in the heat. Likely mild dehydration. No signs of injuries. Neuro exam unremarkable. Labs unremarkable. Not pregnant.  Given IVF. Will dc home.     Richardean Canal, MD 07/29/14 [phone]

## 2014-08-13 ENCOUNTER — Encounter (HOSPITAL_COMMUNITY): Payer: Self-pay | Admitting: Emergency Medicine

## 2014-08-13 ENCOUNTER — Emergency Department (HOSPITAL_COMMUNITY)
Admission: EM | Admit: 2014-08-13 | Discharge: 2014-08-13 | Disposition: A | Discharge status: Home / Self Care | Payer: Medicaid Other | Attending: Emergency Medicine | Admitting: Emergency Medicine

## 2014-08-13 DIAGNOSIS — M79645 Pain in left finger(s): Secondary | ICD-10-CM | POA: Diagnosis not present

## 2014-08-13 DIAGNOSIS — E669 Obesity, unspecified: Secondary | ICD-10-CM | POA: Insufficient documentation

## 2014-08-13 DIAGNOSIS — Z720 Tobacco use: Secondary | ICD-10-CM | POA: Diagnosis not present

## 2014-08-13 MED ORDER — IBUPROFEN 400 MG PO TABS
800.0000 mg | ORAL_TABLET | Freq: Once | ORAL | Status: AC
  Administered 2014-08-13: 800 mg via ORAL
  Filled 2014-08-13: qty 2

## 2014-08-13 MED ORDER — LIDOCAINE HCL 2 % IJ SOLN
10.0000 mL | Freq: Once | INTRAMUSCULAR | Status: AC
  Administered 2014-08-13: 200 mg
  Filled 2014-08-13: qty 20

## 2014-08-13 NOTE — ED Notes (Signed)
Pt stable, ambulatory, states understanding of discharge instructions 

## 2014-08-13 NOTE — ED Provider Notes (Signed)
CSN: [id number]     Arrival date & time 08/13/14  2140 History  This chart was scribed for non-physician practitioner, Dorena Dew. Neva Seat, PA-C working with Mancel Bale, MD by Gwenyth Ober, ED scribe. This patient was seen in room TR09C/TR09C and the patient's care was started at 9:58 PM  No chief complaint on file.  The history is provided by the patient. No language interpreter was used.   HPI Comments: Patricia Warren is a 19 y.o. female who presents to the Emergency Department complaining of a silver ring that became stuck on the PIP joint of her left ring finger, with associated moderate pain, starting 1 hour ago. She states swelling of the affected finger as an associated symptom. Pt reports that the ring became stuck after she tried to force it back on. She has tried removing the ring with string PTA. Pt denies numbness.  Past Medical History  Diagnosis Date  . Mild obesity   . Smoker   . Medical history non-contributory    Past Surgical History  Procedure Laterality Date  . No past surgeries     Family History  Problem Relation Age of Onset  . Asthma Brother    History  Substance Use Topics  . Smoking status: Current Every Day Smoker -- 0.25 packs/day    Types: Cigarettes    Last Attempt to Quit: 05/04/2014  . Smokeless tobacco: Never Used  . Alcohol Use: No   OB History    Gravida Para Term Preterm AB TAB SAB Ectopic Multiple Living   0 1     Review of Systems  Musculoskeletal: Positive for joint swelling and arthralgias.  Skin: Negative for wound.  Neurological: Negative for numbness.  All other systems reviewed and are negative.  Allergies  Review of patient's allergies indicates no known allergies.  Home Medications   Prior to Admission medications   Medication Sig Start Date End Date Taking? Authorizing Provider  docusate sodium (COLACE) 100 MG capsule Take 1 capsule (100 mg total) by mouth 2 (two) times daily. Patient not taking: Reported on  07/11/2014 06/05/14   Smitty Cords, DO  ibuprofen (ADVIL,MOTRIN) 600 MG tablet Take 1 tablet (600 mg total) by mouth every 6 (six) hours. Patient not taking: Reported on 07/11/2014 06/05/14   Smitty Cords, DO  Prenatal Vit-Fe Fumarate-FA (PRENATAL VITAMINS) 28-0.8 MG TABS Take 28 mg by mouth daily with breakfast. 03/14/14   Joanna Puff, MD   BP 124/70 mmHg  Pulse 80  Temp(Src) 98.1 F (36.7 C)  Resp 16  SpO2 99%  LMP 07/13/2014 Physical Exam  Constitutional: She appears well-developed and well-nourished. No distress.  HENT:  Head: Normocephalic and atraumatic.  Eyes: Conjunctivae and EOM are normal.  Neck: Neck supple. No tracheal deviation present.  Cardiovascular: Normal rate.   Pulmonary/Chest: Effort normal. No respiratory distress.  Musculoskeletal:       Hands: Skin: Skin is warm and dry.  Psychiatric: She has a normal mood and affect. Her behavior is normal.  Nursing note and vitals reviewed.   ED Course  Procedures   DIAGNOSTIC STUDIES: Oxygen Saturation is 99% on RA, normal by my interpretation.    COORDINATION OF CARE: 10:03 PM Discussed treatment plan with pt which includes removing the ring with ring-cutters. Pt agreed to plan.  10:21 PM Procedure Note Foreign Body Removal Pt's identification was confirmed. Described risks and benefits of procedure. Pt provided consent for procedure. This procedure was performed  by Marlon Pel, PA-C Location: Left ring finger PIP Anesthetic used and volume: Lidocaine 2% without epinephrine, 2 mL, DIGITAL BLOCK Procedure notes: After digital block, ring removed with ring-cutters. Patient tolerated procedure well without complications. Instructions for care discussed verbally and patient provided with additional written instructions for homecare and f/u.  Full ROM with good color return to distal finger. Finger is numb so no pain. Advised to ice and given ibuprofen.  Labs Review Labs Reviewed - No data  to display  Imaging Review No results found.   EKG Interpretation None      MDM   Final diagnoses:  Finger pain, left    Medications  ibuprofen (ADVIL,MOTRIN) tablet 800 mg (not administered)  lidocaine (XYLOCAINE) 2 % (with pres) injection 200 mg (200 mg Other Given by Other 08/13/14 2219)    18 y.o.Patricia Warren's evaluation in the Emergency Department is complete. It has been determined that no acute conditions requiring further emergency intervention are present at this time. The patient/guardian have been advised of the diagnosis and plan. We have discussed signs and symptoms that warrant return to the ED, such as changes or worsening in symptoms.  Vital signs are stable at discharge. Filed Vitals:   08/13/14 2152  BP: 124/70  Pulse: 80  Temp: 98.1 F (36.7 C)  Resp: 16    Patient/guardian has voiced understanding and agreed to follow-up with the PCP or specialist.   I personally performed the services described in this documentation, which was scribed in my presence. The recorded information has been reviewed and is accurate.    Marlon Pel, PA-C 08/13/14 2251  Mancel Bale, MD 08/14/14 [phone]

## 2014-08-13 NOTE — ED Notes (Signed)
Pt sts she hit her finger on something bending her ring. She bit the ring to try to get it off and it just got worse. Good cap refill.

## 2014-10-07 ENCOUNTER — Emergency Department (HOSPITAL_COMMUNITY)
Admission: EM | Admit: 2014-10-07 | Discharge: 2014-10-07 | Disposition: A | Discharge status: Home / Self Care | Payer: Medicaid Other | Attending: Emergency Medicine | Admitting: Emergency Medicine

## 2014-10-07 ENCOUNTER — Encounter (HOSPITAL_COMMUNITY): Payer: Self-pay | Admitting: Emergency Medicine

## 2014-10-07 DIAGNOSIS — Z202 Contact with and (suspected) exposure to infections with a predominantly sexual mode of transmission: Secondary | ICD-10-CM | POA: Diagnosis not present

## 2014-10-07 DIAGNOSIS — Z720 Tobacco use: Secondary | ICD-10-CM | POA: Diagnosis not present

## 2014-10-07 DIAGNOSIS — N390 Urinary tract infection, site not specified: Secondary | ICD-10-CM | POA: Diagnosis not present

## 2014-10-07 DIAGNOSIS — E669 Obesity, unspecified: Secondary | ICD-10-CM | POA: Insufficient documentation

## 2014-10-07 DIAGNOSIS — Z3202 Encounter for pregnancy test, result negative: Secondary | ICD-10-CM | POA: Insufficient documentation

## 2014-10-07 DIAGNOSIS — N898 Other specified noninflammatory disorders of vagina: Secondary | ICD-10-CM | POA: Diagnosis present

## 2014-10-07 LAB — URINALYSIS, ROUTINE W REFLEX MICROSCOPIC
Bilirubin Urine: NEGATIVE
Glucose, UA: NEGATIVE mg/dL
Hgb urine dipstick: NEGATIVE
KETONES UR: NEGATIVE mg/dL
NITRITE: NEGATIVE
Protein, ur: NEGATIVE mg/dL
Specific Gravity, Urine: 1.022 (ref 1.005–1.030)
UROBILINOGEN UA: 0.2 mg/dL (ref 0.0–1.0)
pH: 7.5 (ref 5.0–8.0)

## 2014-10-07 LAB — URINE MICROSCOPIC-ADD ON

## 2014-10-07 LAB — POC URINE PREG, ED: PREG TEST UR: NEGATIVE

## 2014-10-07 LAB — WET PREP, GENITAL
Clue Cells Wet Prep HPF POC: NONE SEEN
YEAST WET PREP: NONE SEEN

## 2014-10-07 MED ORDER — AZITHROMYCIN 250 MG PO TABS
1000.0000 mg | ORAL_TABLET | Freq: Once | ORAL | Status: AC
  Administered 2014-10-07: 1000 mg via ORAL
  Filled 2014-10-07: qty 4

## 2014-10-07 MED ORDER — ONDANSETRON HCL 4 MG PO TABS
4.0000 mg | ORAL_TABLET | Freq: Once | ORAL | Status: AC
  Administered 2014-10-07: 4 mg via ORAL
  Filled 2014-10-07: qty 1

## 2014-10-07 MED ORDER — CEFTRIAXONE SODIUM 250 MG IJ SOLR
250.0000 mg | Freq: Once | INTRAMUSCULAR | Status: AC
  Administered 2014-10-07: 250 mg via INTRAMUSCULAR
  Filled 2014-10-07: qty 250

## 2014-10-07 MED ORDER — LIDOCAINE HCL (PF) 1 % IJ SOLN
INTRAMUSCULAR | Status: AC
  Administered 2014-10-07: 5 mL
  Filled 2014-10-07: qty 5

## 2014-10-07 MED ORDER — CEPHALEXIN 500 MG PO CAPS: 500.0000 mg | ORAL_CAPSULE | Freq: Two times a day (BID) | ORAL | Status: DC

## 2014-10-07 MED ORDER — METRONIDAZOLE 500 MG PO TABS
2000.0000 mg | ORAL_TABLET | Freq: Once | ORAL | Status: AC
  Administered 2014-10-07: 2000 mg via ORAL
  Filled 2014-10-07: qty 4

## 2014-10-07 NOTE — ED Provider Notes (Signed)
CSN: [id number]     Arrival date & time 10/07/14  1122 History   First MD Initiated Contact with Patient 10/07/14 1139     Chief Complaint  Patient presents with  . Vaginal Discharge     (Consider location/radiation/quality/duration/timing/severity/associated sxs/prior Treatment) HPI   Patricia Warren is a 19 y.o. female who presents for possible STD exposure and 1 week history of vaginal discomfort and non-odorous whitish, yellow vaginal discharge after recent unprotected sex.  Denies vaginal bleeding, abdominal pian, N/V/D, dysuria, hematuria, CP, or SOB.  LMP 9/13.   Past Medical History  Diagnosis Date  . Mild obesity   . Smoker   . Medical history non-contributory    Past Surgical History  Procedure Laterality Date  . No past surgeries     Family History  Problem Relation Age of Onset  . Asthma Brother    Social History  Substance Use Topics  . Smoking status: Current Every Day Smoker -- 0.25 packs/day    Types: Cigarettes    Last Attempt to Quit: 05/04/2014  . Smokeless tobacco: Never Used  . Alcohol Use: No   OB History    Gravida Para Term Preterm AB TAB SAB Ectopic Multiple Living   0 1     Review of Systems All other systems negative unless otherwise stated in HPI   Allergies  Review of patient's allergies indicates no known allergies.  Home Medications   Prior to Admission medications   Medication Sig Start Date End Date Taking? Authorizing Provider  docusate sodium (COLACE) 100 MG capsule Take 1 capsule (100 mg total) by mouth 2 (two) times daily. Patient not taking: Reported on 07/11/2014 06/05/14   Smitty Cords, DO  ibuprofen (ADVIL,MOTRIN) 600 MG tablet Take 1 tablet (600 mg total) by mouth every 6 (six) hours. Patient not taking: Reported on 07/11/2014 06/05/14   Smitty Cords, DO  Prenatal Vit-Fe Fumarate-FA (PRENATAL VITAMINS) 28-0.8 MG TABS Take 28 mg by mouth daily with breakfast. 03/14/14   Joanna Puff, MD   BP  140/79 mmHg  Pulse 91  Temp(Src) 98.7 F (37.1 C) (Oral)  Resp 18  SpO2 98%  LMP 09/18/2014 Physical Exam  Constitutional: She is oriented to person, place, and time. She appears well-developed and well-nourished.  HENT:  Head: Normocephalic and atraumatic.  Mouth/Throat: Oropharynx is clear and moist.  Eyes: Conjunctivae are normal. Pupils are equal, round, and reactive to light.  Neck: Normal range of motion. Neck supple.  Cardiovascular: Normal rate, regular rhythm and normal heart sounds.   No murmur heard. Pulmonary/Chest: Effort normal and breath sounds normal. No respiratory distress. She has no wheezes. She has no rales.  Abdominal: Soft. Bowel sounds are normal. She exhibits no distension. There is no tenderness. There is no rebound and no guarding.  Genitourinary: Uterus normal. Pelvic exam was performed with patient prone. There is no tenderness on the right labia. There is no tenderness on the left labia. Cervix exhibits discharge. Cervix exhibits no motion tenderness and no friability. Right adnexum displays no tenderness. Left adnexum displays no tenderness. No erythema in the vagina. Vaginal discharge found.  Musculoskeletal: Normal range of motion.  Lymphadenopathy:    She has no cervical adenopathy.  Neurological: She is alert and oriented to person, place, and time.  Skin: Skin is warm and dry.  Psychiatric: She has a normal mood and affect. Her behavior is normal.    ED Course  Procedures (including critical  care time) Labs Review Labs Reviewed  WET PREP, GENITAL - Abnormal; Notable for the following:    Trich, Wet Prep MANY (*)    WBC, Wet Prep HPF POC MANY (*)    All other components within normal limits  URINALYSIS, ROUTINE W REFLEX MICROSCOPIC (NOT AT [facility]) - Abnormal; Notable for the following:    APPearance CLOUDY (*)    Leukocytes, UA LARGE (*)    All other components within normal limits  URINE MICROSCOPIC-ADD ON - Abnormal; Notable for the following:     Squamous Epithelial / LPF MANY (*)    All other components within normal limits  HIV ANTIBODY (ROUTINE TESTING)  RPR  POC URINE PREG, ED  GC/CHLAMYDIA PROBE AMP ([place]) NOT AT [facility]    Imaging Review No results found. I have personally reviewed and evaluated these images and lab results as part of my medical decision-making.   EKG Interpretation None      MDM   Final diagnoses:  Possible exposure to STD  Urinary tract infection without hematuria, site unspecified    Patient here for possible STD exposure after recent unprotected sex.  Presents with 1 week history of whitish, yellow vaginal discharge and discomfort.  No fevers, N/V/D, vaginal bleeding, or urinary symptoms.  VSS, NAD, pt appears non-toxic.  On exam, no adnexal tenderness.  Cervical discharge noted.  HIV, RPR, GC/Chlamydia, and wet prep, and UA.  Will treat with 1g azithro and 250 mg rocephin.  Wet prep shows trichomoniasis, given 2g metronidazole as well. Negative POC urine pregnancy.  UA shows evidence of UTI.  D/c home with keflex.  Patient stable for discharge.  PCP follow up.  Patient agrees with and acknowledges the above plan.     Cheri Fowler, PA-C 10/07/14 1400  Vanetta Mulders, MD 10/08/14 [phone]

## 2014-10-07 NOTE — ED Notes (Signed)
PA at the bedside.

## 2014-10-07 NOTE — Discharge Instructions (Signed)
Urinary Tract Infection °Urinary tract infections (UTIs) can develop anywhere along your urinary tract. Your urinary tract is your body's drainage system for removing wastes and extra water. Your urinary tract includes two kidneys, two ureters, a bladder, and a urethra. Your kidneys are a pair of bean-shaped organs. Each kidney is about the size of your fist. They are located below your ribs, one on each side of your spine. °CAUSES °Infections are caused by microbes, which are microscopic organisms, including fungi, viruses, and bacteria. These organisms are so small that they can only be seen through a microscope. Bacteria are the microbes that most commonly cause UTIs. °SYMPTOMS  °Symptoms of UTIs may vary by age and gender of the patient and by the location of the infection. Symptoms in young women typically include a frequent and intense urge to urinate and a painful, burning feeling in the bladder or urethra during urination. Older women and men are more likely to be tired, shaky, and weak and have muscle aches and abdominal pain. A fever may mean the infection is in your kidneys. Other symptoms of a kidney infection include pain in your back or sides below the ribs, nausea, and vomiting. °DIAGNOSIS °To diagnose a UTI, your caregiver will ask you about your symptoms. Your caregiver also will ask to provide a urine sample. The urine sample will be tested for bacteria and white blood cells. White blood cells are made by your body to help fight infection. °TREATMENT  °Typically, UTIs can be treated with medication. Because most UTIs are caused by a bacterial infection, they usually can be treated with the use of antibiotics. The choice of antibiotic and length of treatment depend on your symptoms and the type of bacteria causing your infection. °HOME CARE INSTRUCTIONS °· If you were prescribed antibiotics, take them exactly as your caregiver instructs you. Finish the medication even if you feel better after you  have only taken some of the medication. °· Drink enough water and fluids to keep your urine clear or pale yellow. °· Avoid caffeine, tea, and carbonated beverages. They tend to irritate your bladder. °· Empty your bladder often. Avoid holding urine for long periods of time. °· Empty your bladder before and after sexual intercourse. °· After a bowel movement, women should cleanse from front to back. Use each tissue only once. °SEEK MEDICAL CARE IF:  °· You have back pain. °· You develop a fever. °· Your symptoms do not begin to resolve within 3 days. °SEEK IMMEDIATE MEDICAL CARE IF:  °· You have severe back pain or lower abdominal pain. °· You develop chills. °· You have nausea or vomiting. °· You have continued burning or discomfort with urination. °MAKE SURE YOU:  °· Understand these instructions. °· Will watch your condition. °· Will get help right away if you are not doing well or get worse. °Document Released: 10/01/2004 Document Revised: 06/23/2011 Document Reviewed: 01/30/2011 °ExitCare® Patient Information ©2015 [employer]. This information is not intended to replace advice given to you by your health care provider. Make sure you discuss any questions you have with your health care provider. ° °Sexually Transmitted Disease °A sexually transmitted disease (STD) is a disease or infection often passed to another person during sex. However, STDs can be passed through nonsexual ways. An STD can be passed through: °· Spit (saliva). °· Semen. °· Blood. °· Mucus from the vagina. °· Pee (urine). °HOW CAN I LESSEN MY CHANCES OF GETTING AN STD? °· Use: °¨ Latex condoms. °¨ Water-soluble   lubricants with condoms. Do not use petroleum jelly or oils. °¨ Dental dams. These are small pieces of latex that are used as a barrier during oral sex. °· Avoid having more than one sex partner. °· Do not have sex with someone who has other sex partners. °· Do not have sex with anyone you do not know or who is at high risk for an  STD. °· Avoid risky sex that can break your skin. °· Do not have sex if you have open sores on your mouth or skin. °· Avoid drinking too much alcohol or taking illegal drugs. Alcohol and drugs can affect your good judgment. °· Avoid oral and anal sex acts. °· Get shots (vaccines) for HPV and hepatitis. °· If you are at risk of being infected with HIV, it is advised that you take a certain medicine daily to prevent HIV infection. This is called pre-exposure prophylaxis (PrEP). You may be at risk if: °¨ You are a man who has sex with other men (MSM). °¨ You are attracted to the opposite sex (heterosexual) and are having sex with more than one partner. °¨ You take drugs with a needle. °¨ You have sex with someone who has HIV. °· Talk with your doctor about if you are at high risk of being infected with HIV. If you begin to take PrEP, get tested for HIV first. Get tested every 3 months for as long as you are taking PrEP. °WHAT SHOULD I DO IF I THINK I HAVE AN STD? °· See your doctor. °· Tell your sex partner(s) that you have an STD. They should be tested and treated. °· Do not have sex until your doctor says it is okay. °WHEN SHOULD I GET HELP? °Get help right away if: °· You have bad belly (abdominal) pain. °· You are a man and have puffiness (swelling) or pain in your testicles. °· You are a woman and have puffiness in your vagina. °Document Released: 01/30/2004 Document Revised: 12/27/2012 Document Reviewed: 06/17/2012 °ExitCare® Patient Information ©2015 [employer]. This information is not intended to replace advice given to you by your health care provider. Make sure you discuss any questions you have with your health care provider. ° °

## 2014-10-07 NOTE — ED Notes (Signed)
Pt sts vaginal discharge that is yellow and irritation x 1 week; pt sts LMP was 9/13

## 2014-10-08 LAB — GC/CHLAMYDIA PROBE AMP (~~LOC~~) NOT AT ARMC
CHLAMYDIA, DNA PROBE: POSITIVE — AB
NEISSERIA GONORRHEA: POSITIVE — AB

## 2014-10-08 LAB — RPR: RPR Ser Ql: NONREACTIVE

## 2014-10-09 ENCOUNTER — Telehealth (HOSPITAL_COMMUNITY): Payer: Self-pay

## 2014-10-09 LAB — HIV ANTIBODY (ROUTINE TESTING W REFLEX): HIV SCREEN 4TH GENERATION: NONREACTIVE

## 2014-10-09 NOTE — Telephone Encounter (Signed)
Spoke with pt. Verified ID. Informed of labs. Treated per protocol. [employer] form faxed. Pt informed to abstain from sexual activity x 10 days and to notify partner for testing and treatment.  

## 2014-11-22 ENCOUNTER — Emergency Department (HOSPITAL_COMMUNITY)
Admission: EM | Admit: 2014-11-22 | Discharge: 2014-11-22 | Disposition: A | Discharge status: Home / Self Care | Payer: Medicaid Other | Attending: Emergency Medicine | Admitting: Emergency Medicine

## 2014-11-22 ENCOUNTER — Encounter (HOSPITAL_COMMUNITY): Payer: Self-pay

## 2014-11-22 DIAGNOSIS — G43809 Other migraine, not intractable, without status migrainosus: Secondary | ICD-10-CM | POA: Diagnosis not present

## 2014-11-22 DIAGNOSIS — F17210 Nicotine dependence, cigarettes, uncomplicated: Secondary | ICD-10-CM | POA: Diagnosis not present

## 2014-11-22 DIAGNOSIS — Z3202 Encounter for pregnancy test, result negative: Secondary | ICD-10-CM | POA: Insufficient documentation

## 2014-11-22 DIAGNOSIS — R51 Headache: Secondary | ICD-10-CM | POA: Diagnosis present

## 2014-11-22 LAB — I-STAT CHEM 8, ED
BUN: 14 mg/dL (ref 6–20)
Calcium, Ion: 1.19 mmol/L (ref 1.12–1.23)
Chloride: 102 mmol/L (ref 101–111)
Creatinine, Ser: 0.7 mg/dL (ref 0.44–1.00)
Glucose, Bld: 90 mg/dL (ref 65–99)
HCT: 42 % (ref 36.0–46.0)
Hemoglobin: 14.3 g/dL (ref 12.0–15.0)
Potassium: 4 mmol/L (ref 3.5–5.1)
SODIUM: 142 mmol/L (ref 135–145)
TCO2: 24 mmol/L (ref 0–100)

## 2014-11-22 LAB — PREGNANCY, URINE: PREG TEST UR: NEGATIVE

## 2014-11-22 MED ORDER — KETOROLAC TROMETHAMINE 30 MG/ML IJ SOLN
30.0000 mg | Freq: Once | INTRAMUSCULAR | Status: AC
  Administered 2014-11-22: 30 mg via INTRAVENOUS
  Filled 2014-11-22: qty 1

## 2014-11-22 MED ORDER — PROCHLORPERAZINE EDISYLATE 5 MG/ML IJ SOLN
10.0000 mg | Freq: Once | INTRAMUSCULAR | Status: AC
  Administered 2014-11-22: 10 mg via INTRAVENOUS
  Filled 2014-11-22: qty 2

## 2014-11-22 MED ORDER — MORPHINE SULFATE (PF) 4 MG/ML IV SOLN
4.0000 mg | Freq: Once | INTRAVENOUS | Status: DC
  Filled 2014-11-22: qty 1

## 2014-11-22 MED ORDER — SODIUM CHLORIDE 0.9 % IV BOLUS (SEPSIS)
1000.0000 mL | Freq: Once | INTRAVENOUS | Status: AC
  Administered 2014-11-22: 1000 mL via INTRAVENOUS

## 2014-11-22 NOTE — ED Provider Notes (Signed)
CSN: [id number]     Arrival date & time 11/22/14  0730 History   First MD Initiated Contact with Patient 11/22/14 [phone]     Chief Complaint  Patient presents with  . Headache     (Consider location/radiation/quality/duration/timing/severity/associated sxs/prior Treatment) HPI  Patient is a 19 year old female with no significant past medical history presenting with 2 days of severe headache. Patient reports that she does not have a history of migraines or headaches. Pain is in the frontal region of her head and described as sharp and throbbing. She has tried tylenol, which did not work. Associated with phonophobia and some photophobia. No weakness or numbness. No nausea or vomiting. No dizziness. Loud noises make her headache worse. She has not noticed anything that makes the pain better. No head trauma. No fevers or chills.   Past Medical History  Diagnosis Date  . Mild obesity   . Smoker   . Medical history non-contributory    Past Surgical History  Procedure Laterality Date  . No past surgeries     Family History  Problem Relation Age of Onset  . Asthma Brother    Social History  Substance Use Topics  . Smoking status: Current Every Day Smoker -- 0.25 packs/day    Types: Cigarettes    Last Attempt to Quit: 05/04/2014  . Smokeless tobacco: Never Used  . Alcohol Use: 1.2 oz/week    2 Cans of beer per week     Comment: last weekend   OB History    Gravida Para Term Preterm AB TAB SAB Ectopic Multiple Living   0 1     Review of Systems  Constitutional: Negative for fever and chills.  Eyes: Positive for photophobia.  Respiratory: Negative.   Cardiovascular: Negative.   Gastrointestinal: Negative for nausea and vomiting.  Genitourinary: Negative.  Negative for dysuria.  Musculoskeletal: Negative.   Skin: Negative.   Allergic/Immunologic: Negative.   Neurological: Positive for headaches. Negative for weakness and numbness.  Hematological: Negative.    Psychiatric/Behavioral: Negative.       Allergies  Review of patient's allergies indicates no known allergies.  Home Medications   Prior to Admission medications   Medication Sig Start Date End Date Taking? Authorizing Provider  cephALEXin (KEFLEX) 500 MG capsule Take 1 capsule (500 mg total) by mouth 2 (two) times daily. Patient not taking: Reported on 11/22/2014 10/07/14   Cheri Fowler, PA-C  docusate sodium (COLACE) 100 MG capsule Take 1 capsule (100 mg total) by mouth 2 (two) times daily. Patient not taking: Reported on 07/11/2014 06/05/14   Smitty Cords, DO  ibuprofen (ADVIL,MOTRIN) 600 MG tablet Take 1 tablet (600 mg total) by mouth every 6 (six) hours. Patient not taking: Reported on 07/11/2014 06/05/14   Smitty Cords, DO  Prenatal Vit-Fe Fumarate-FA (PRENATAL VITAMINS) 28-0.8 MG TABS Take 28 mg by mouth daily with breakfast. Patient not taking: Reported on 11/22/2014 03/14/14   Joanna Puff, MD   BP 121/77 mmHg  Pulse 82  Temp(Src) 98.3 F (36.8 C) (Oral)  Ht  (1.727 m)  Wt 170 lb (77.111 kg)  BMI 25.85 kg/m2  SpO2 99%  LMP 11/22/2014  Breastfeeding? No Physical Exam  Constitutional: She is oriented to person, place, and time. She appears well-developed and well-nourished. No distress.  HENT:  Head: Normocephalic and atraumatic.  Eyes: EOM are normal.  Neck: Normal range of motion. Neck supple.  Cardiovascular: Normal rate, regular rhythm  and normal heart sounds.   No murmur heard. Pulmonary/Chest: Effort normal and breath sounds normal. No respiratory distress.  Abdominal: Soft. Bowel sounds are normal.  Musculoskeletal: Normal range of motion. She exhibits no edema.  Neurological: She is alert and oriented to person, place, and time. She has normal strength. No cranial nerve deficit or sensory deficit.  Skin: Skin is warm and dry.  Psychiatric: She has a normal mood and affect. Her behavior is normal.  Nursing note and vitals  reviewed.   ED Course  Procedures (including critical care time) Labs Review Labs Reviewed  PREGNANCY, URINE  I-STAT CHEM 8, ED    Imaging Review No results found. I have personally reviewed and evaluated these images and lab results as part of my medical decision-making.   EKG Interpretation None      MDM   Final diagnoses:  Other migraine without status migrainosus, not intractable   Patient is a 19 year old female with no significant past medical history presenting with migraine headache over the past 2 days. Normal neurological exam with no focal deficits. No signs of infection. Urine pregnancy and chem 8 negative. Pain improved in ED with migraine cocktail. Patient stable for discharge home. Return precautions reviewed.  Ardith Darkaleb M Parker, MD 11/22/14 [phone]  Azalia BilisKevin Campos, MD 11/22/14 0930

## 2014-11-22 NOTE — Discharge Instructions (Signed)
Migraine Headache  A migraine headache is very bad, throbbing pain on one or both sides of your head. Talk to your doctor about what things may bring on (trigger) your migraine headaches.  HOME CARE  · Only take medicines as told by your doctor.  · Lie down in a dark, quiet room when you have a migraine.  · Keep a journal to find out if certain things bring on migraine headaches. For example, write down:    What you eat and drink.    How much sleep you get.    Any change to your diet or medicines.  · Lessen how much alcohol you drink.  · Quit smoking if you smoke.  · Get enough sleep.  · Lessen any stress in your life.  · Keep lights dim if bright lights bother you or make your migraines worse.  GET HELP RIGHT AWAY IF:   · Your migraine becomes really bad.  · You have a fever.  · You have a stiff neck.  · You have trouble seeing.  · Your muscles are weak, or you lose muscle control.  · You lose your balance or have trouble walking.  · You feel like you will pass out (faint), or you pass out.  · You have really bad symptoms that are different than your first symptoms.  MAKE SURE YOU:   · Understand these instructions.  · Will watch your condition.  · Will get help right away if you are not doing well or get worse.     This information is not intended to replace advice given to you by your health care provider. Make sure you discuss any questions you have with your health care provider.     Document Released: 10/01/2007 Document Revised: 03/16/2011 Document Reviewed: 08/29/2012  Elsevier Interactive Patient Education ©2016 [employer]

## 2014-11-22 NOTE — ED Notes (Signed)
Pt c/o headache since 15 th states loud sounds and lights make it worse. Denies nausea/vomiting

## 2014-11-22 NOTE — ED Notes (Signed)
Pt states she has to leave and is unable to stay any longer with child.

## 2015-01-31 ENCOUNTER — Encounter: Payer: Self-pay | Admitting: Physician Assistant

## 2015-02-14 ENCOUNTER — Encounter: Payer: Self-pay | Admitting: Physician Assistant

## 2015-07-30 ENCOUNTER — Encounter (HOSPITAL_COMMUNITY): Payer: Self-pay | Admitting: Emergency Medicine

## 2015-07-30 ENCOUNTER — Ambulatory Visit (HOSPITAL_COMMUNITY)
Admission: EM | Admit: 2015-07-30 | Discharge: 2015-07-30 | Disposition: A | Discharge status: Home / Self Care | Payer: Medicaid Other | Attending: Family Medicine | Admitting: Family Medicine

## 2015-07-30 DIAGNOSIS — R1030 Lower abdominal pain, unspecified: Secondary | ICD-10-CM

## 2015-07-30 LAB — POCT URINALYSIS DIP (DEVICE)
Glucose, UA: NEGATIVE mg/dL
HGB URINE DIPSTICK: NEGATIVE
Ketones, ur: 80 mg/dL — AB
Leukocytes, UA: NEGATIVE
Nitrite: NEGATIVE
PH: 6 (ref 5.0–8.0)
PROTEIN: 30 mg/dL — AB
UROBILINOGEN UA: 0.2 mg/dL (ref 0.0–1.0)

## 2015-07-30 LAB — POCT PREGNANCY, URINE: Preg Test, Ur: NEGATIVE

## 2015-07-30 NOTE — ED Provider Notes (Signed)
CSN: [id number]     Arrival date & time 07/30/15  1446 History   None    Chief Complaint  Patient presents with  . Abdominal Pain   (Consider location/radiation/quality/duration/timing/severity/associated sxs/prior Treatment) HPI History obtained from patient:  Pt presents with the cc of:  Abdominal pain Duration of symptoms: 3 days Treatment prior to arrival: None Context: Patient states that she has episodic abdominal pain that is sharp in nature lasting for about 30 seconds and then goes away completely. It does not keep her from doing any of her normal activities but does make her take noticed that it is happening. She states that she has not had any urinary symptoms. No constipation review recently. No previous history of symptoms of this nature. She has not taken any medicine at home. Other symptoms include: None Pain score: 0-1 FAMILY HISTORY: Hypertension    Past Medical History:  Diagnosis Date  . Medical history non-contributory   . Mild obesity   . Smoker    Past Surgical History:  Procedure Laterality Date  . NO PAST SURGERIES     Family History  Problem Relation Age of Onset  . Asthma Brother    Social History  Substance Use Topics  . Smoking status: Current Every Day Smoker    Packs/day: 0.25    Types: Cigarettes    Last attempt to quit: 05/04/2014  . Smokeless tobacco: Never Used  . Alcohol use 1.2 oz/week    2 Cans of beer per week     Comment: last weekend   OB History    Gravida Para Term Preterm AB Living   SAB TAB Ectopic Multiple Live Births         0       Review of Systems  Denies: HEADACHE, NAUSEA,   CHEST PAIN, CONGESTION, DYSURIA, SHORTNESS OF BREATH  Allergies  Review of patient's allergies indicates no known allergies.  Home Medications   Prior to Admission medications   Medication Sig Start Date End Date Taking? Authorizing Provider  cephALEXin (KEFLEX) 500 MG capsule Take 1 capsule (500 mg total) by mouth 2 (two)  times daily. Patient not taking: Reported on 11/22/2014 10/07/14   Cheri Fowler, PA-C  docusate sodium (COLACE) 100 MG capsule Take 1 capsule (100 mg total) by mouth 2 (two) times daily. Patient not taking: Reported on 07/11/2014 06/05/14   Smitty Cords, DO  ibuprofen (ADVIL,MOTRIN) 600 MG tablet Take 1 tablet (600 mg total) by mouth every 6 (six) hours. Patient not taking: Reported on 07/11/2014 06/05/14   Smitty Cords, DO  Prenatal Vit-Fe Fumarate-FA (PRENATAL VITAMINS) 28-0.8 MG TABS Take 28 mg by mouth daily with breakfast. Patient not taking: Reported on 11/22/2014 03/14/14   Joanna Puff, MD   Meds Ordered and Administered this Visit  Medications - No data to display  BP 149/80 (BP Location: Left Arm)   Pulse 68   Temp 98.4 F (36.9 C) (Oral)   Resp 16   Ht  (1.727 m)   Wt 180 lb (81.6 kg)   LMP 06/28/2015 (Exact Date)   SpO2 100%   Breastfeeding? No   BMI 27.37 kg/m  No data found.   Physical Exam NURSES NOTES AND VITAL SIGNS REVIEWED. CONSTITUTIONAL: Well developed, well nourished, no acute distress HEENT: normocephalic, atraumatic EYES: Conjunctiva normal NECK:normal ROM, supple, no adenopathy PULMONARY:No respiratory distress, normal effort ABDOMINAL: Soft, ND, NT BS+, No CVAT MUSCULOSKELETAL: Normal ROM  of all extremities,  SKIN: warm and dry without rash PSYCHIATRIC: Mood and affect, behavior are normal  Urgent Care Course   Clinical Course    Procedures (including critical care time)  Labs Review Labs Reviewed  POCT URINALYSIS DIP (DEVICE) - Abnormal; Notable for the following:       Result Value   Bilirubin Urine SMALL (*)    Ketones, ur 80 (*)    Protein, ur 30 (*)    All other components within normal limits  POCT PREGNANCY, URINE   Discussed with patient prior to discharge Imaging Review No results found.   Visual Acuity Review  Right Eye Distance:   Left Eye Distance:   Bilateral Distance:    Right Eye Near:    Left Eye Near:    Bilateral Near:         MDM   1. Lower abdominal pain     Patient is reassured that there are no issues that require transfer to higher level of care at this time or additional tests. Patient is advised to continue home symptomatic treatment. Patient is advised that if there are new or worsening symptoms to attend the emergency department, contact primary care provider, or return to UC. Instructions of care provided discharged home in stable condition.    THIS NOTE WAS GENERATED USING A VOICE RECOGNITION SOFTWARE PROGRAM. ALL REASONABLE EFFORTS  WERE MADE TO PROOFREAD THIS DOCUMENT FOR ACCURACY.  I have verbally reviewed the discharge instructions with the patient. A printed AVS was given to the patient.  All questions were answered prior to discharge.      Tharon Aquas, PA 07/30/15 [phone]

## 2015-07-30 NOTE — ED Triage Notes (Signed)
The patient presented to the [facility] with a complaint of lower abdominal pain that she described as sharp that radiates into her groin area. The patient denied any dysuria or discharge.

## 2016-04-04 IMAGING — US US OB COMP LESS 14 WK
1 series · 14 of 28 positions shown · non-contrast
Comparison: None.

CLINICAL DATA: First trimester vaginal bleeding and abdominal pain.

EXAM:
OBSTETRIC <14 WK US AND TRANSVAGINAL OB US
TECHNIQUE: Both transabdominal and transvaginal ultrasound examinations were
performed for complete evaluation of the gestation as well as the
maternal uterus, adnexal regions, and pelvic cul-de-sac.
Transvaginal technique was performed to assess early pregnancy.

[Series 1: us ob comp less 14 wks · 14 of 37 slices shown]
[im 2/37]
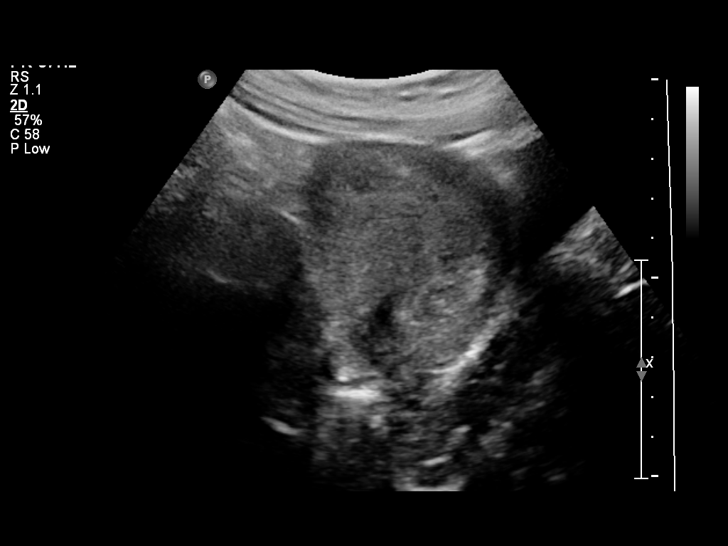
[im 5/37]
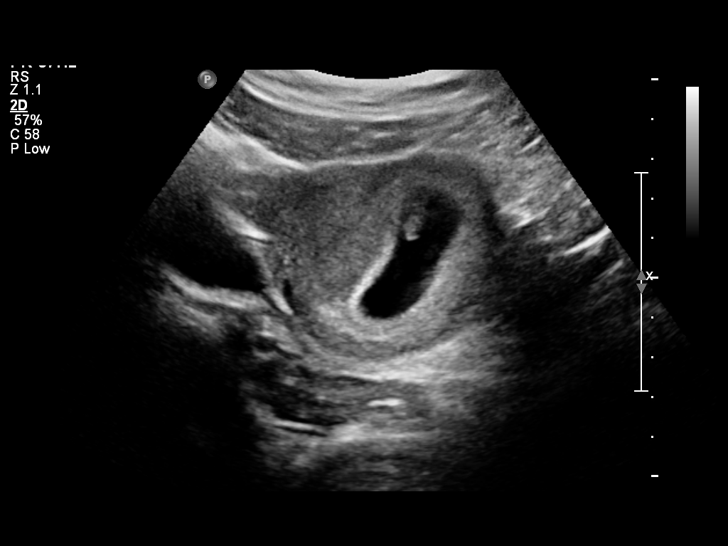
[im 7/37]
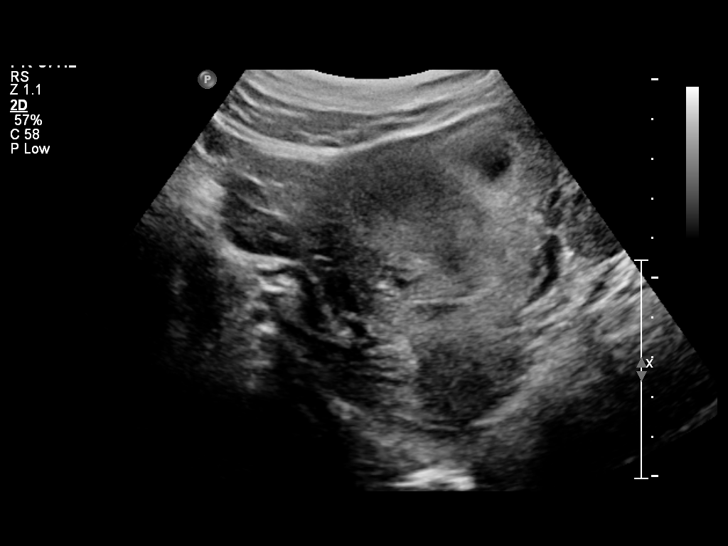
[im 10/37]
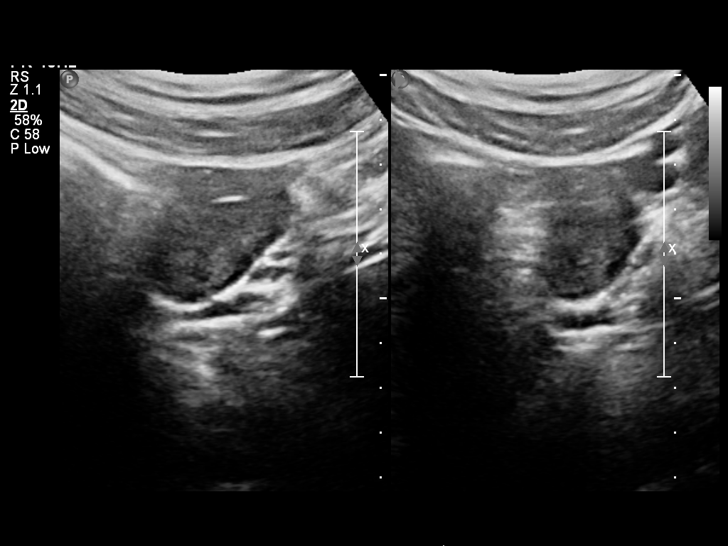
[im 13/37]
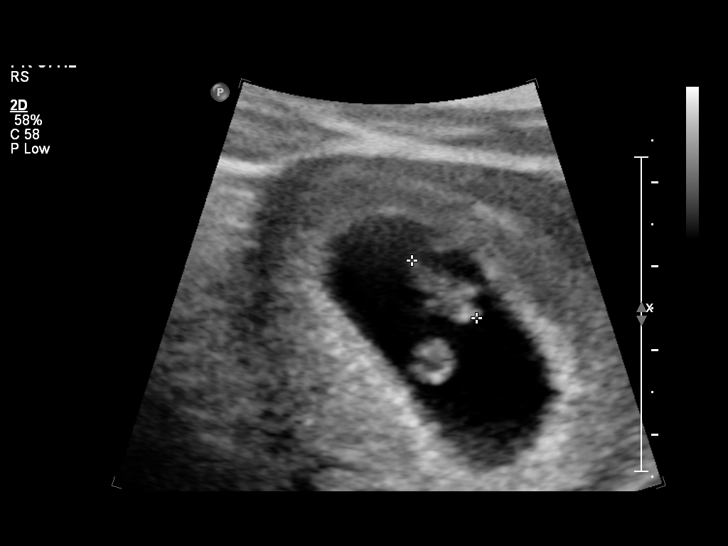
[im 15/37]
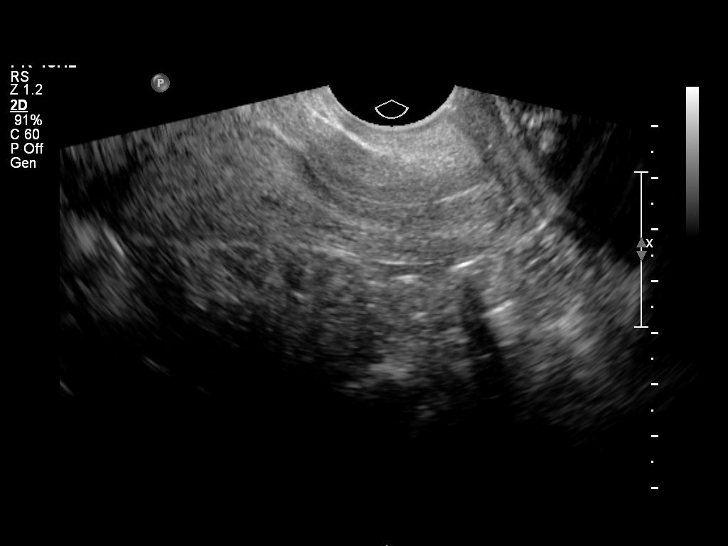
[im 18/37]
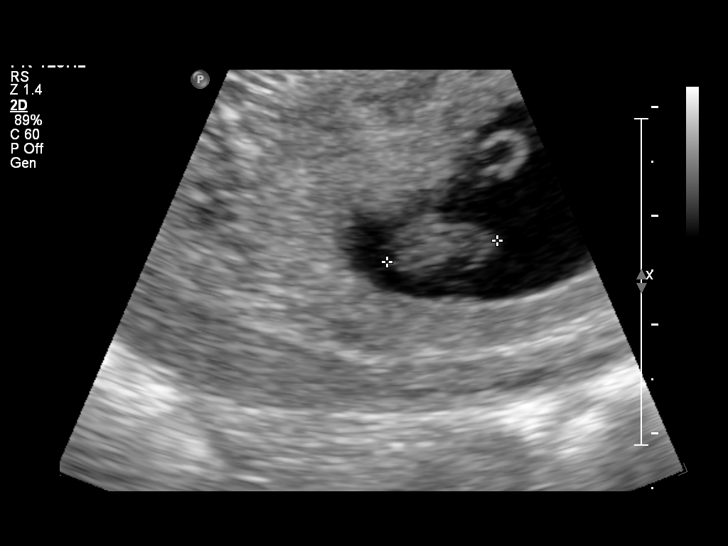
[im 21/37]
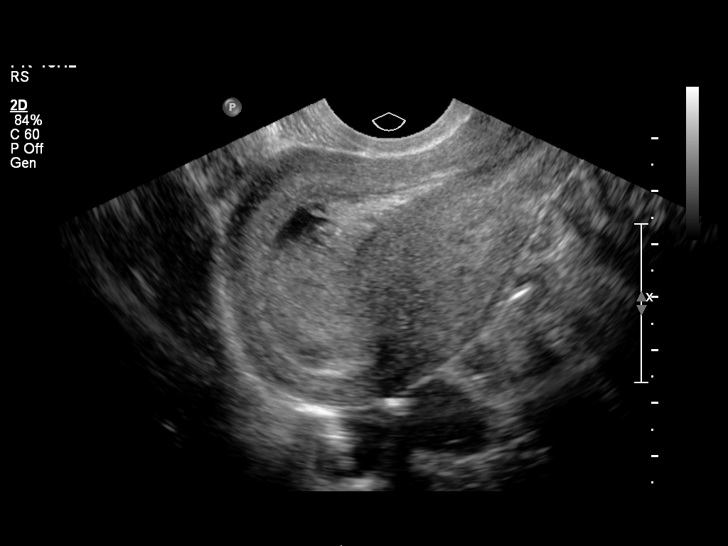
[im 23/37]
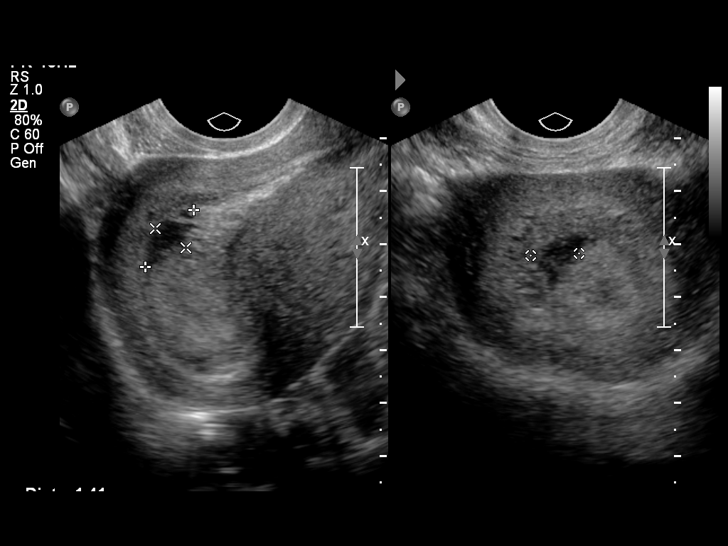
[im 26/37]
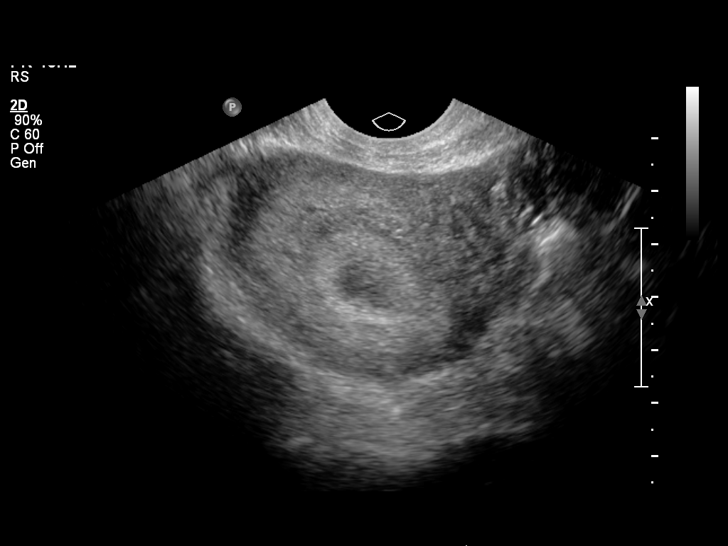
[im 29/37]
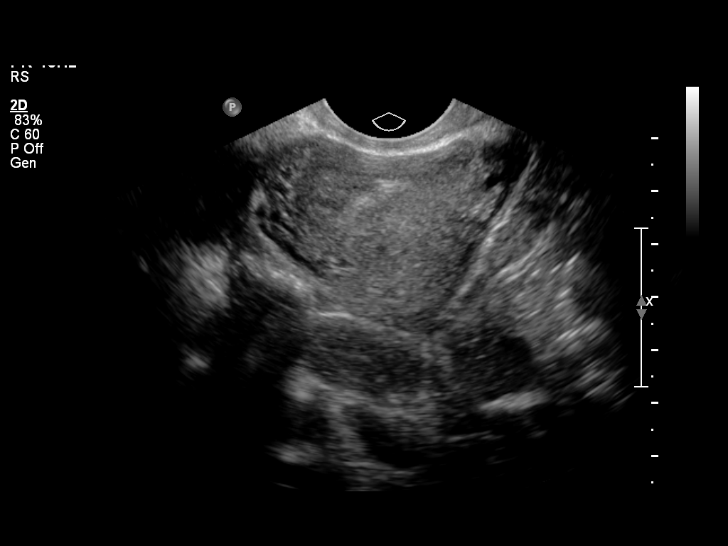
[im 31/37]
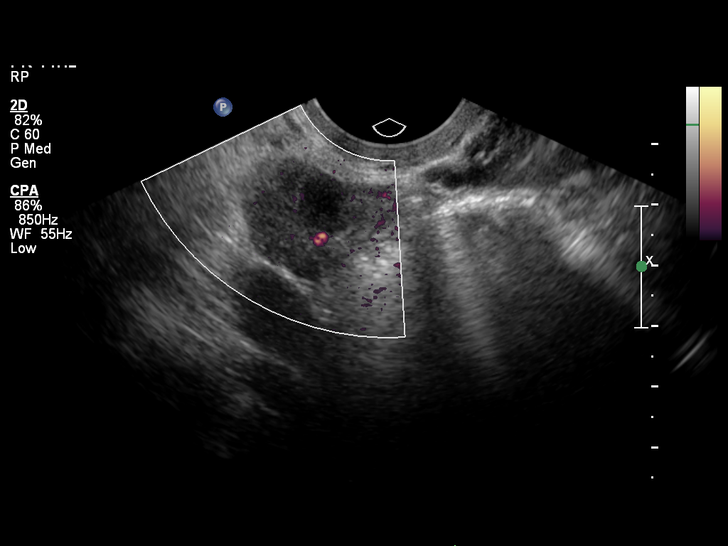
[im 34/37]
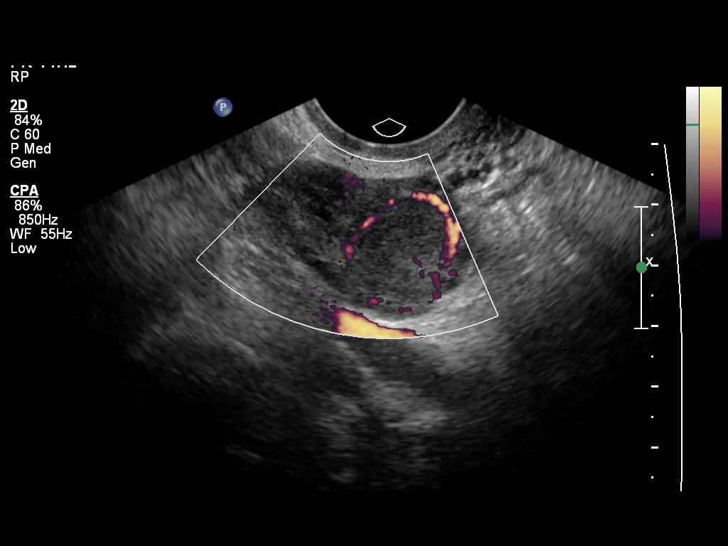
[im 37/37]
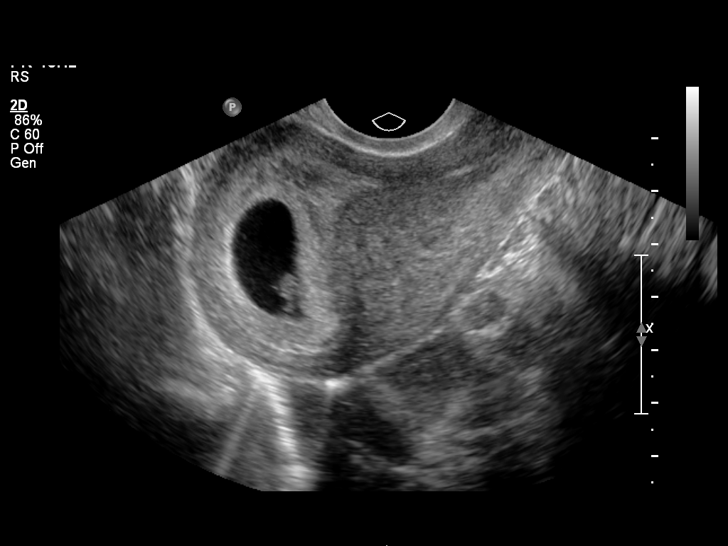

[14 of 28 positions shown; findings below may reference images not displayed]

FINDINGS: Intrauterine gestational sac: Visualized/normal in shape.

Yolk sac:  Visualized

Embryo:  Visualized

Cardiac Activity: Visualized

Heart Rate:  140 bpm

CRL:   10  mm   7 w 1 d                  US EDC: 06/02/2014

Maternal uterus/adnexae: Small subchorionic hemorrhage noted. Left
ovarian corpus luteum noted. Right ovary is normal in appearance. No
adnexal mass or free fluid identified.
IMPRESSION: Single living IUP measuring 7 weeks 1 day with US EDC of 06/02/2014.

Small subchorionic hemorrhage.

## 2016-05-10 ENCOUNTER — Emergency Department (HOSPITAL_COMMUNITY)
Admission: EM | Admit: 2016-05-10 | Discharge: 2016-05-10 | Disposition: A | Discharge status: Home / Self Care | Payer: Self-pay | Attending: Emergency Medicine | Admitting: Emergency Medicine

## 2016-05-10 ENCOUNTER — Encounter (HOSPITAL_COMMUNITY): Payer: Self-pay | Admitting: *Deleted

## 2016-05-10 DIAGNOSIS — N760 Acute vaginitis: Secondary | ICD-10-CM | POA: Insufficient documentation

## 2016-05-10 DIAGNOSIS — F17210 Nicotine dependence, cigarettes, uncomplicated: Secondary | ICD-10-CM | POA: Insufficient documentation

## 2016-05-10 DIAGNOSIS — N72 Inflammatory disease of cervix uteri: Secondary | ICD-10-CM | POA: Insufficient documentation

## 2016-05-10 DIAGNOSIS — B9689 Other specified bacterial agents as the cause of diseases classified elsewhere: Secondary | ICD-10-CM | POA: Insufficient documentation

## 2016-05-10 LAB — URINALYSIS, ROUTINE W REFLEX MICROSCOPIC
Bilirubin Urine: NEGATIVE
Glucose, UA: NEGATIVE mg/dL
Hgb urine dipstick: NEGATIVE
Ketones, ur: NEGATIVE mg/dL
LEUKOCYTES UA: NEGATIVE
Nitrite: NEGATIVE
PROTEIN: NEGATIVE mg/dL
Specific Gravity, Urine: 1.02 (ref 1.005–1.030)
pH: 6 (ref 5.0–8.0)

## 2016-05-10 LAB — WET PREP, GENITAL
Sperm: NONE SEEN
Trich, Wet Prep: NONE SEEN
YEAST WET PREP: NONE SEEN

## 2016-05-10 LAB — POC URINE PREG, ED: Preg Test, Ur: NEGATIVE

## 2016-05-10 MED ORDER — METRONIDAZOLE 500 MG PO TABS: 500.0000 mg | ORAL_TABLET | Freq: Two times a day (BID) | ORAL | 0 refills | Status: DC

## 2016-05-10 MED ORDER — CEFTRIAXONE SODIUM 250 MG IJ SOLR
250.0000 mg | Freq: Once | INTRAMUSCULAR | Status: AC
  Administered 2016-05-10: 250 mg via INTRAMUSCULAR
  Filled 2016-05-10: qty 250

## 2016-05-10 MED ORDER — STERILE WATER FOR INJECTION IJ SOLN
INTRAMUSCULAR | Status: AC
  Administered 2016-05-10: 1 mL
  Filled 2016-05-10: qty 10

## 2016-05-10 MED ORDER — AZITHROMYCIN 250 MG PO TABS
1000.0000 mg | ORAL_TABLET | Freq: Once | ORAL | Status: AC
  Administered 2016-05-10: 1000 mg via ORAL
  Filled 2016-05-10: qty 4

## 2016-05-10 NOTE — Discharge Instructions (Signed)
Take the medication as directed. Follow up with the health department for further STI screening.

## 2016-05-10 NOTE — ED Triage Notes (Signed)
Vaginal discharge for 2 days  No pain anywhere  lmp  April 30

## 2016-05-10 NOTE — ED Provider Notes (Signed)
[facility]EMER[facility]ENCY DEPT Provider Note   CSN: [id number] Arrival date & time: 05/10/16  1553  By signing my name below, I, [name], attest that this documentation has been prepared under the direction and in the presence of non-physician practitioner, Kerrie Buffalo, NP. Electronically Signed: [name], Scribe. 05/10/2016. 4:57 PM.  History   Chief Complaint Chief Complaint  Patient presents with  . Vaginal Discharge   The history is provided by the patient. No language interpreter was used.  Vaginal Discharge   This is a new problem. The current episode started 2 days ago. The problem occurs hourly. The problem has not changed since onset.The discharge occurs spontaneously. The discharge was brown. She is not pregnant. She has not missed her period. Pertinent negatives include no fever, no abdominal pain, no nausea, no vomiting, no dysuria and no frequency. She has tried nothing for the symptoms. The treatment provided no relief.    HPI Comments:  Patricia Warren is a 21 y.o. female who presents to the Emergency Department complaining of frequent, unchanged vaginal discharge onset 2 days. She describes her discharge as brown and copious. No modifying factors indicated. Pt has had unprotected sex with new sex partners in the past 2 months. Denies any nausea, vomiting, fever, chills or abdominal pain. Pt is not currently taking birth control.   Past Medical History:  Diagnosis Date  . Medical history non-contributory   . Mild obesity   . Smoker     Patient Active Problem List   Diagnosis Date Noted  . Labor and delivery indication for care or intervention 06/03/2014  . Oligohydramnios in third trimester 06/03/2014  . Insufficient prenatal care in third trimester 05/30/2014  . Marijuana use 03/20/2014  . Poor fetal growth affecting management of mother in third trimester   . Supervision of normal pregnancy in second trimester 03/14/2014  . Smoker     Past Surgical History:    Procedure Laterality Date  . NO PAST SURGERIES      OB History    Gravida Para Term Preterm AB Living   1 1 1     1    SAB TAB Ectopic Multiple Live Births         0 1       Home Medications    Prior to Admission medications   Medication Sig Start Date End Date Taking? Authorizing Provider  metroNIDAZOLE (FLAGYL) 500 MG tablet Take 1 tablet (500 mg total) by mouth 2 (two) times daily. 05/10/16   Janne Napoleon, NP  Prenatal Vit-Fe Fumarate-FA (PRENATAL VITAMINS) 28-0.8 MG TABS Take 28 mg by mouth daily with breakfast. Patient not taking: Reported on 11/22/2014 03/14/14   Joanna Puff, MD    Family History Family History  Problem Relation Age of Onset  . Asthma Brother     Social History Social History  Substance Use Topics  . Smoking status: Current Every Day Smoker    Packs/day: 0.25    Types: Cigarettes    Last attempt to quit: 05/04/2014  . Smokeless tobacco: Never Used  . Alcohol use 1.2 oz/week    2 Cans of beer per week     Comment: last weekend     Allergies   Patient has no known allergies.   Review of Systems Review of Systems  Constitutional: Negative for chills and fever.  Gastrointestinal: Negative for abdominal pain, nausea and vomiting.  Genitourinary: Positive for vaginal discharge. Negative for dysuria, frequency, vaginal bleeding and vaginal pain.  Musculoskeletal: Negative for  back pain.  Skin: Negative for rash.     Physical Exam Updated Vital Signs BP 119/69   Pulse 99   Temp 98.6 F (37 C)   Ht 5\' 8"  (1.727 m)   Wt 77.1 kg   LMP 05/03/2016   SpO2 98%   BMI 25.85 kg/m   Physical Exam  Constitutional: She is oriented to person, place, and time. She appears well-developed and well-nourished. No distress.  HENT:  Head: Normocephalic.  Eyes: Conjunctivae are normal.  Cardiovascular: Normal rate.   Pulmonary/Chest: Effort normal.  Abdominal: Soft. There is no tenderness.  Genitourinary:  Genitourinary Comments: External  genitalia without lesions, yellow d/c vaginal vault, cervix inflamed. No CMT, no adnexal tenderness or mass palpated. Uterus without palpable enlargement.   Neurological: She is alert and oriented to person, place, and time.  Skin: Skin is warm and dry.  Psychiatric: She has a normal mood and affect.  Nursing note and vitals reviewed.    ED Treatments / Results  DIAGNOSTIC STUDIES:  Oxygen Saturation is 98% on RA, normal by my interpretation.    COORDINATION OF CARE:  5:03 PM Discussed treatment plan with pt at bedside which includes pelvic exam and pt agreed to plan.  Labs (all labs ordered are listed, but only abnormal results are displayed) Labs Reviewed  WET PREP, GENITAL - Abnormal; Notable for the following:       Result Value   Clue Cells Wet Prep HPF POC PRESENT (*)    WBC, Wet Prep HPF POC MODERATE (*)    All other components within normal limits  URINALYSIS, ROUTINE W REFLEX MICROSCOPIC  RPR  HIV ANTIBODY (ROUTINE TESTING)  POC URINE PREG, ED  GC[facility] AMP ([facility]    Radiology No results found.  Procedures Procedures (including critical care time)  Medications Ordered in ED Medications  cefTRIAXone (ROCEPHIN) injection 250 mg (250 mg Intramuscular Given 05/10/16 1731)  azithromycin (ZITHROMAX) tablet 1,000 mg (1,000 mg Oral Given 05/10/16 1730)  sterile water (preservative free) injection (1 mL  Given 05/10/16 1731)     Initial Impression / Assessment and Plan / ED Course  I have reviewed the triage vital signs and the nursing notes.  Patient treated in the ED for STI with Rocefin, Zithromax and Flagyl. Patient advised to inform and treat all sexual partners.  Pt advised on safe sex practices and understands that they have GC/Chlamydia cultures pending and will result in 2-3 days. HIV and RPR sent. Pt encouraged to follow up at local health department for future STI checks. No concern for PID. Discussed return precautions. Pt appears  safe for discharge.   Final Clinical Impressions(s) / ED Diagnoses   Final diagnoses:  BV (bacterial vaginosis)  Cervicitis    New Prescriptions New Prescriptions   METRONIDAZOLE (FLAGYL) 500 MG TABLET    Take 1 tablet (500 mg total) by mouth 2 (two) times daily.  I personally performed the services described in this documentation, which was scribed in my presence. The recorded information has been reviewed and is accurate.     Kerrie Buffaloeese, [place], [place] 05/10/16 1755    Mancel BaleWentz, [place], MD 05/13/16 [phone]

## 2016-05-11 LAB — RPR: RPR: NONREACTIVE

## 2016-05-11 LAB — GC/CHLAMYDIA PROBE AMP (~~LOC~~) NOT AT ARMC
Chlamydia: POSITIVE — AB
NEISSERIA GONORRHEA: NEGATIVE

## 2016-05-11 LAB — HIV ANTIBODY (ROUTINE TESTING W REFLEX): HIV SCREEN 4TH GENERATION: NONREACTIVE

## 2016-10-24 ENCOUNTER — Inpatient Hospital Stay (HOSPITAL_COMMUNITY): Payer: Self-pay

## 2016-10-24 ENCOUNTER — Inpatient Hospital Stay (HOSPITAL_COMMUNITY)
Admission: AD | Admit: 2016-10-24 | Discharge: 2016-10-24 | Disposition: A | Discharge status: Home / Self Care | Payer: Self-pay | Source: Ambulatory Visit | Attending: Obstetrics and Gynecology | Admitting: Obstetrics and Gynecology

## 2016-10-24 ENCOUNTER — Encounter (HOSPITAL_COMMUNITY): Payer: Self-pay | Admitting: *Deleted

## 2016-10-24 DIAGNOSIS — O26891 Other specified pregnancy related conditions, first trimester: Secondary | ICD-10-CM | POA: Insufficient documentation

## 2016-10-24 DIAGNOSIS — O3680X0 Pregnancy with inconclusive fetal viability, not applicable or unspecified: Secondary | ICD-10-CM

## 2016-10-24 DIAGNOSIS — F17210 Nicotine dependence, cigarettes, uncomplicated: Secondary | ICD-10-CM | POA: Insufficient documentation

## 2016-10-24 DIAGNOSIS — Z3A01 Less than 8 weeks gestation of pregnancy: Secondary | ICD-10-CM | POA: Insufficient documentation

## 2016-10-24 DIAGNOSIS — O99331 Smoking (tobacco) complicating pregnancy, first trimester: Secondary | ICD-10-CM | POA: Insufficient documentation

## 2016-10-24 DIAGNOSIS — R109 Unspecified abdominal pain: Secondary | ICD-10-CM | POA: Insufficient documentation

## 2016-10-24 LAB — URINALYSIS, ROUTINE W REFLEX MICROSCOPIC
BACTERIA UA: NONE SEEN
BILIRUBIN URINE: NEGATIVE
Glucose, UA: NEGATIVE mg/dL
Hgb urine dipstick: NEGATIVE
Ketones, ur: NEGATIVE mg/dL
NITRITE: NEGATIVE
PH: 7 (ref 5.0–8.0)
Protein, ur: NEGATIVE mg/dL
SPECIFIC GRAVITY, URINE: 1.015 (ref 1.005–1.030)

## 2016-10-24 LAB — WET PREP, GENITAL
CLUE CELLS WET PREP: NONE SEEN
Sperm: NONE SEEN
TRICH WET PREP: NONE SEEN
Yeast Wet Prep HPF POC: NONE SEEN

## 2016-10-24 LAB — HCG, QUANTITATIVE, PREGNANCY: HCG, BETA CHAIN, QUANT, S: 185348 m[IU]/mL — AB (ref <5)

## 2016-10-24 LAB — POCT PREGNANCY, URINE: PREG TEST UR: POSITIVE — AB

## 2016-10-24 NOTE — MAU Provider Note (Signed)
History   G2P1001  In with c/o abd pain that has been going on for 3 wks. LMP 09/22/16. Pt did not know she was pregnant. Denies vag bleeding  CSN: [id number]  Arrival date & time 10/24/16  1608   None     Chief Complaint  Patient presents with  . Pelvic Pain    HPI  Past Medical History:  Diagnosis Date  . Medical history non-contributory   . Mild obesity   . Smoker     Past Surgical History:  Procedure Laterality Date  . NO PAST SURGERIES      Family History  Problem Relation Age of Onset  . Asthma Brother     Social History  Substance Use Topics  . Smoking status: Current Every Day Smoker    Packs/day: 0.25    Types: Cigarettes  . Smokeless tobacco: Never Used  . Alcohol use 1.2 oz/week    2 Cans of beer per week     Comment: last weekend    OB History    Gravida Para Term Preterm AB Living   2 1 1     1    SAB TAB Ectopic Multiple Live Births         0 1      Review of Systems  Constitutional: Negative.   HENT: Negative.   Eyes: Negative.   Respiratory: Negative.   Cardiovascular: Negative.   Gastrointestinal: Positive for abdominal pain.  Endocrine: Negative.   Genitourinary: Negative.   Musculoskeletal: Negative.   Skin: Negative.   Allergic/Immunologic: Negative.   Neurological: Negative.   Hematological: Negative.   Psychiatric/Behavioral: Negative.     Allergies  Patient has no known allergies.  Home Medications    BP 139/80   Pulse (!) 105   Temp 98.3 F (36.8 C)   Resp 18   Ht 5\' 8"  (1.727 m)   Wt 175 lb (79.4 kg)   LMP 09/22/2016   BMI 26.61 kg/m   Physical Exam  Constitutional: She is oriented to person, place, and time. She appears well-developed and well-nourished.  HENT:  Head: Normocephalic.  Eyes: Pupils are equal, round, and reactive to light.  Neck: Normal range of motion.  Cardiovascular: Normal rate, regular rhythm, normal heart sounds and intact distal pulses.   Pulmonary/Chest: Effort normal and breath  sounds normal.  Abdominal: Soft. Bowel sounds are normal.  Genitourinary: Vagina normal and uterus normal.  Musculoskeletal: Normal range of motion.  Neurological: She is alert and oriented to person, place, and time. She has normal reflexes.  Skin: Skin is warm and dry.  Psychiatric: She has a normal mood and affect. Her behavior is normal. Judgment and thought content normal.    MAU Course  Procedures (including critical care time)  Labs Reviewed  URINALYSIS, ROUTINE W REFLEX MICROSCOPIC - Abnormal; Notable for the following:       Result Value   Leukocytes, UA SMALL (*)    Squamous Epithelial / LPF 0-5 (*)    All other components within normal limits  POCT PREGNANCY, URINE - Abnormal; Notable for the following:    Preg Test, Ur POSITIVE (*)    All other components within normal limits  HCG, QUANTITATIVE, PREGNANCY   No results found.   1. Abdominal pain in pregnancy, first trimester   2. Pregnancy of unknown anatomic location       MDM  VSS, wet prep , cultures done. Will get vag probe us to assess abd pain. [place] shows viable IUP at  8.2 wks. Will d/c home

## 2016-10-24 NOTE — MAU Note (Signed)
Pt reports she has been having sharp pain in her pelvis and vaginal area for about 3 weeks. Denies any vaginal discharge or bleeding

## 2016-10-24 NOTE — Discharge Instructions (Signed)
Abdominal Pain During Pregnancy °Abdominal pain is common in pregnancy. Most of the time, it does not cause harm. There are many causes of abdominal pain. Some causes are more serious than others and sometimes the cause is not known. Abdominal pain can be a sign that something is very wrong with the pregnancy or the pain may have nothing to do with the pregnancy. Always tell your health care provider if you have any abdominal pain. °Follow these instructions at home: °· Do not have sex or put anything in your vagina until your symptoms go away completely. °· Watch your abdominal pain for any changes. °· Get plenty of rest until your pain improves. °· Drink enough fluid to keep your urine clear or pale yellow. °· Take over-the-counter or prescription medicines only as told by your health care provider. °· Keep all follow-up visits as told by your health care provider. This is important. °Contact a health care provider if: °· You have a fever. °· Your pain gets worse or you have cramping. °· Your pain continues after resting. °Get help right away if: °· You are bleeding, leaking fluid, or passing tissue from the vagina. °· You have vomiting or diarrhea that does not go away. °· You have painful or bloody urination. °· You notice a decrease in your baby's movements. °· You feel very weak or faint. °· You have shortness of breath. °· You develop a severe headache with abdominal pain. °· You have abnormal vaginal discharge with abdominal pain. °This information is not intended to replace advice given to you by your health care provider. Make sure you discuss any questions you have with your health care provider. °Document Released: 12/22/2004 Document Revised: 10/03/2015 Document Reviewed: 07/21/2012 °Elsevier Interactive Patient Education © 2018 Else[employer] Inc[employer] ° °First Trimester of Pregnancy °The first trimester of pregnancy is from week 1 until the end of week 13 (months 1 through 3). A week after a sperm fertilizes an  egg, the egg will implant on the wall of the uterus. This embryo will begin to develop into a baby. Genes from you and your partner will form the baby. The female genes will determine whether the baby will be a boy or a girl. At 6-8 weeks, the eyes and face will be formed, and the heartbeat can be seen on ultrasound. At the end of 12 weeks, all the baby's organs will be formed. °Now that you are pregnant, you will want to do everything you can to have a healthy baby. Two of the most important things are to get good prenatal care and to follow your health care provider's instructions. Prenatal care is all the medical care you receive before the baby's birth. This care will help prevent, find, and treat any problems during the pregnancy and childbirth. °Body changes during your first trimester °Your body goes through many changes during pregnancy. The changes vary from woman to woman. °· You may gain or lose a couple of pounds at first. °· You may feel sick to your stomach (nauseous) and you may throw up (vomit). If the vomiting is uncontrollable, call your health care provider. °· You may tire easily. °· You may develop headaches that can be relieved by medicines. All medicines should be approved by your health care provider. °· You may urinate more often. Painful urination may mean you have a bladder infection. °· You may develop heartburn as a result of your pregnancy. °· You may develop constipation because certain hormones are causing the muscles   that push stool through your intestines to slow down. °· You may develop hemorrhoids or swollen veins (varicose veins). °· Your breasts may begin to grow larger and become tender. Your nipples may stick out more, and the tissue that surrounds them (areola) may become darker. °· Your gums may bleed and may be sensitive to brushing and flossing. °· Dark spots or blotches (chloasma, mask of pregnancy) may develop on your face. This will likely fade after the baby is  born. °· Your menstrual periods will stop. °· You may have a loss of appetite. °· You may develop cravings for certain kinds of food. °· You may have changes in your emotions from day to day, such as being excited to be pregnant or being concerned that something may go wrong with the pregnancy and baby. °· You may have more vivid and strange dreams. °· You may have changes in your hair. These can include thickening of your hair, rapid growth, and changes in texture. Some women also have hair loss during or after pregnancy, or hair that feels dry or thin. Your hair will most likely return to normal after your baby is born. ° °What to expect at prenatal visits °During a routine prenatal visit: °· You will be weighed to make sure you and the baby are growing normally. °· Your blood pressure will be taken. °· Your abdomen will be measured to track your baby's growth. °· The fetal heartbeat will be listened to between weeks 10 and 14 of your pregnancy. °· Test results from any previous visits will be discussed. ° °Your health care provider may ask you: °· How you are feeling. °· If you are feeling the baby move. °· If you have had any abnormal symptoms, such as leaking fluid, bleeding, severe headaches, or abdominal cramping. °· If you are using any tobacco products, including cigarettes, chewing tobacco, and electronic cigarettes. °· If you have any questions. ° °Other tests that may be performed during your first trimester include: °· Blood tests to find your blood type and to check for the presence of any previous infections. The tests will also be used to check for low iron levels (anemia) and protein on red blood cells (Rh antibodies). Depending on your risk factors, or if you previously had diabetes during pregnancy, you may have tests to check for high blood sugar that affects pregnant women (gestational diabetes). °· Urine tests to check for infections, diabetes, or protein in the urine. °· An ultrasound to  confirm the proper growth and development of the baby. °· Fetal screens for spinal cord problems (spina bifida) and Down syndrome. °· HIV (human immunodeficiency virus) testing. Routine prenatal testing includes screening for HIV, unless you choose not to have this test. °· You may need other tests to make sure you and the baby are doing well. ° °Follow these instructions at home: °Medicines °· Follow your health care provider's instructions regarding medicine use. Specific medicines may be either safe or unsafe to take during pregnancy. °· Take a prenatal vitamin that contains at least 600 micrograms (mcg) of folic acid. °· If you develop constipation, try taking a stool softener if your health care provider approves. °Eating and drinking °· Eat a balanced diet that includes fresh fruits and vegetables, whole grains, good sources of protein such as meat, eggs, or tofu, and low-fat dairy. Your health care provider will help you determine the amount of weight gain that is right for you. °· Avoid raw meat and uncooked cheese.   These carry germs that can cause birth defects in the baby. °· Eating four or five small meals rather than three large meals a day may help relieve nausea and vomiting. If you start to feel nauseous, eating a few soda crackers can be helpful. Drinking liquids between meals, instead of during meals, also seems to help ease nausea and vomiting. °· Limit foods that are high in fat and processed sugars, such as fried and sweet foods. °· To prevent constipation: °? Eat foods that are high in fiber, such as fresh fruits and vegetables, whole grains, and beans. °? Drink enough fluid to keep your urine clear or pale yellow. °Activity °· Exercise only as directed by your health care provider. Most women can continue their usual exercise routine during pregnancy. Try to exercise for 30 minutes at least 5 days a week. Exercising will help you: °? Control your weight. °? Stay in shape. °? Be prepared for  labor and delivery. °· Experiencing pain or cramping in the lower abdomen or lower back is a good sign that you should stop exercising. Check with your health care provider before continuing with normal exercises. °· Try to avoid standing for long periods of time. Move your legs often if you must stand in one place for a long time. °· Avoid heavy lifting. °· Wear low-heeled shoes and practice good posture. °· You may continue to have sex unless your health care provider tells you not to. °Relieving pain and discomfort °· Wear a good support bra to relieve breast tenderness. °· Take warm sitz baths to soothe any pain or discomfort caused by hemorrhoids. Use hemorrhoid cream if your health care provider approves. °· Rest with your legs elevated if you have leg cramps or low back pain. °· If you develop varicose veins in your legs, wear support hose. Elevate your feet for 15 minutes, 3-4 times a day. Limit salt in your diet. °Prenatal care °· Schedule your prenatal visits by the twelfth week of pregnancy. They are usually scheduled monthly at first, then more often in the last 2 months before delivery. °· Write down your questions. Take them to your prenatal visits. °· Keep all your prenatal visits as told by your health care provider. This is important. °Safety °· Wear your seat belt at all times when driving. °· Make a list of emergency phone numbers, including numbers for family, friends, the hospital, and police and fire departments. °General instructions °· Ask your health care provider for a referral to a local prenatal education class. Begin classes no later than the beginning of month 6 of your pregnancy. °· Ask for help if you have counseling or nutritional needs during pregnancy. Your health care provider can offer advice or refer you to specialists for help with various needs. °· Do not use hot tubs, steam rooms, or saunas. °· Do not douche or use tampons or scented sanitary pads. °· Do not cross your legs  for long periods of time. °· Avoid cat litter boxes and soil used by cats. These carry germs that can cause birth defects in the baby and possibly loss of the fetus by miscarriage or stillbirth. °· Avoid all smoking, herbs, alcohol, and medicines not prescribed by your health care provider. Chemicals in these products affect the formation and growth of the baby. °· Do not use any products that contain nicotine or tobacco, such as cigarettes and e-cigarettes. If you need help quitting, ask your health care provider. You may receive counseling support and   other resources to help you quit. °· Schedule a dentist appointment. At home, brush your teeth with a soft toothbrush and be gentle when you floss. °Contact a health care provider if: °· You have dizziness. °· You have mild pelvic cramps, pelvic pressure, or nagging pain in the abdominal area. °· You have persistent nausea, vomiting, or diarrhea. °· You have a bad smelling vaginal discharge. °· You have pain when you urinate. °· You notice increased swelling in your face, hands, legs, or ankles. °· You are exposed to fifth disease or chickenpox. °· You are exposed to German measles (rubella) and have never had it. °Get help right away if: °· You have a fever. °· You are leaking fluid from your vagina. °· You have spotting or bleeding from your vagina. °· You have severe abdominal cramping or pain. °· You have rapid weight gain or loss. °· You vomit blood or material that looks like coffee grounds. °· You develop a severe headache. °· You have shortness of breath. °· You have any kind of trauma, such as from a fall or a car accident. °Summary °· The first trimester of pregnancy is from week 1 until the end of week 13 (months 1 through 3). °· Your body goes through many changes during pregnancy. The changes vary from woman to woman. °· You will have routine prenatal visits. During those visits, your health care provider will examine you, discuss any test results you  may have, and talk with you about how you are feeling. °This information is not intended to replace advice given to you by your health care provider. Make sure you discuss any questions you have with your health care provider. °Document Released: 12/16/2000 Document Revised: 12/04/2015 Document Reviewed: 12/04/2015 °Elsevier Interactive Patient Education © 2017 Elsevier Inc. ° °

## 2016-10-26 LAB — GC/CHLAMYDIA PROBE AMP (~~LOC~~) NOT AT ARMC
Chlamydia: POSITIVE — AB
Neisseria Gonorrhea: NEGATIVE

## 2016-10-27 ENCOUNTER — Telehealth: Payer: Self-pay | Admitting: Medical

## 2016-10-27 DIAGNOSIS — A749 Chlamydial infection, unspecified: Secondary | ICD-10-CM

## 2016-10-27 MED ORDER — AZITHROMYCIN 250 MG PO TABS: 1000.0000 mg | ORAL_TABLET | Freq: Once | ORAL | 0 refills | Status: AC

## 2016-10-27 NOTE — Telephone Encounter (Signed)
This patient tested positive for Chlamydia  She has NKDA. I have informed the patient of her results and confirmed her pharmacy is correct in her chart. Rx sent to patient's pharmacy for Zithromax. Patient advised of need for partner treatment and abstinence x 2 weeks after partner treatment.   Marny LowensteinWenzel, Julie N, PA-C  10/27/2016 9:31 AM

## 2016-11-11 ENCOUNTER — Encounter: Payer: Self-pay | Admitting: Family Medicine

## 2016-11-11 ENCOUNTER — Encounter: Payer: Self-pay | Admitting: General Practice

## 2017-01-05 NOTE — L&D Delivery Note (Signed)
Patient is 22 y.o. G2P1001 [redacted]w[redacted]d admitted for SOL, hx of cHTN not on any medications   Delivery Note At 6:32 AM a viable female was delivered via Vaginal, Spontaneous (Presentation: vertex ;  LOA).  APGAR: 9, 9. Placenta status: intact .  Cord: 3 vessel  with the following complications: none.    Anesthesia:  none Episiotomy:  none Lacerations:  none Suture Repair: none Est. Blood Loss (mL): 100  Mom to postpartum.  Baby to Couplet care / Skin to Skin.   Upon arrival patient was complete and pushing. She pushed with good maternal effort to deliver a healthy baby female. Baby delivered without difficulty, was noted to have good tone and place on maternal abdomen for oral suctioning, drying and stimulation. Delayed cord clamping performed. Placenta delivered intact with 3V cord. Vaginal canal and perineum was inspected and intact; hemostatic. Pitocin was started and uterus massaged until bleeding slowed. Counts of sharps, instruments, and lap pads were all correct.   Leland Her, DO PGY-2 5/18/20196:54 AM

## 2017-01-11 ENCOUNTER — Emergency Department (HOSPITAL_COMMUNITY)
Admission: EM | Admit: 2017-01-11 | Discharge: 2017-01-11 | Disposition: A | Discharge status: Home / Self Care | Payer: Medicaid Other | Attending: Emergency Medicine | Admitting: Emergency Medicine

## 2017-01-11 ENCOUNTER — Other Ambulatory Visit: Payer: Self-pay

## 2017-01-11 ENCOUNTER — Encounter (HOSPITAL_COMMUNITY): Payer: Self-pay

## 2017-01-11 DIAGNOSIS — Z5321 Procedure and treatment not carried out due to patient leaving prior to being seen by health care provider: Secondary | ICD-10-CM | POA: Insufficient documentation

## 2017-01-11 DIAGNOSIS — Z202 Contact with and (suspected) exposure to infections with a predominantly sexual mode of transmission: Secondary | ICD-10-CM | POA: Diagnosis not present

## 2017-01-11 LAB — URINALYSIS, ROUTINE W REFLEX MICROSCOPIC
Bilirubin Urine: NEGATIVE
GLUCOSE, UA: NEGATIVE mg/dL
Hgb urine dipstick: NEGATIVE
Ketones, ur: NEGATIVE mg/dL
LEUKOCYTES UA: NEGATIVE
Nitrite: NEGATIVE
PH: 6 (ref 5.0–8.0)
Protein, ur: NEGATIVE mg/dL
Specific Gravity, Urine: 1.021 (ref 1.005–1.030)

## 2017-01-11 NOTE — ED Notes (Addendum)
Pt told nurse that she needed to go home, she had small children with her and needed to go home and could not wait, pt encouraged to wait said she  Would be back

## 2017-01-11 NOTE — ED Triage Notes (Signed)
Per Pt, Pt is coming from home with complaints of lower stomach pains x 1.5 weeks and was told by partner that he was positive for Chlamydia. Pt is requesting to be checked. Denies discharge.

## 2017-01-28 ENCOUNTER — Encounter: Payer: Self-pay | Admitting: Student

## 2017-01-28 ENCOUNTER — Ambulatory Visit (INDEPENDENT_AMBULATORY_CARE_PROVIDER_SITE_OTHER): Payer: Medicaid Other | Admitting: Student

## 2017-01-28 VITALS — BP 134/82 | HR 91 | Wt 180.7 lb

## 2017-01-28 DIAGNOSIS — F1290 Cannabis use, unspecified, uncomplicated: Secondary | ICD-10-CM | POA: Diagnosis not present

## 2017-01-28 DIAGNOSIS — O4102X0 Oligohydramnios, second trimester, not applicable or unspecified: Secondary | ICD-10-CM | POA: Diagnosis not present

## 2017-01-28 DIAGNOSIS — Z124 Encounter for screening for malignant neoplasm of cervix: Secondary | ICD-10-CM | POA: Diagnosis not present

## 2017-01-28 DIAGNOSIS — O99320 Drug use complicating pregnancy, unspecified trimester: Secondary | ICD-10-CM

## 2017-01-28 DIAGNOSIS — Z3480 Encounter for supervision of other normal pregnancy, unspecified trimester: Secondary | ICD-10-CM

## 2017-01-28 DIAGNOSIS — Z113 Encounter for screening for infections with a predominantly sexual mode of transmission: Secondary | ICD-10-CM | POA: Diagnosis not present

## 2017-01-28 DIAGNOSIS — Z3482 Encounter for supervision of other normal pregnancy, second trimester: Secondary | ICD-10-CM

## 2017-01-28 DIAGNOSIS — O4103X0 Oligohydramnios, third trimester, not applicable or unspecified: Secondary | ICD-10-CM

## 2017-01-28 DIAGNOSIS — O99322 Drug use complicating pregnancy, second trimester: Secondary | ICD-10-CM | POA: Diagnosis not present

## 2017-01-28 LAB — POCT URINALYSIS DIP (DEVICE)
BILIRUBIN URINE: NEGATIVE
GLUCOSE, UA: NEGATIVE mg/dL
Hgb urine dipstick: NEGATIVE
Ketones, ur: NEGATIVE mg/dL
NITRITE: NEGATIVE
PH: 7 (ref 5.0–8.0)
PROTEIN: NEGATIVE mg/dL
Specific Gravity, Urine: 1.015 (ref 1.005–1.030)
Urobilinogen, UA: 0.2 mg/dL (ref 0.0–1.0)

## 2017-01-28 NOTE — Patient Instructions (Addendum)
-  With Eber Jonesarolyn: Time out for two minutes. Stay with her and be calm; don't engage.  -Early bedtime

## 2017-01-28 NOTE — Progress Notes (Addendum)
  Subjective:    Patricia Warren is being seen today for her first obstetrical visit.  This is not a planned pregnancy. She is at 4833w0d gestation. Her obstetrical history is significant for smoker and marijuana use.. Relationship with FOB: currently not involved. . Patient does intend to breast feed. Pregnancy history fully reviewed.  Patient reports no complaints. Patient feels overwhelmed and worried about stressors (having at [age] year old, [place] back to [place] She has an appointment with her therapist soon; does not want to see Asher MuirJamie today.   Review of Systems:   Review of Systems  Constitutional: Negative.   HENT: Negative.   Eyes: Negative.   Respiratory: Negative.   Genitourinary: Negative.   Neurological: Negative.     Objective:     BP 134/82   Pulse 91   Wt 180 lb 11.2 oz (82 kg)   LMP 09/22/2016   BMI 27.48 kg/m  Physical Exam  Constitutional: She is oriented to person, place, and time. She appears well-developed and well-nourished.  HENT:  Head: Normocephalic.  Neck: Normal range of motion.  Respiratory: Effort normal.  GI: Soft.  Genitourinary: Vagina normal.  Neurological: She is alert and oriented to person, place, and time.  Skin: Skin is warm and dry.    Exam    Assessment:    Pregnancy: G2P1001 Patient Active Problem List   Diagnosis Date Noted  . Supervision of other normal pregnancy, antepartum 01/28/2017  . Oligohydramnios in third trimester 06/03/2014  . Marijuana use 03/20/2014  . Poor fetal growth affecting management of mother in third trimester   . Smoker        Plan:     Initial labs drawn. Prenatal vitamins. Problem list reviewed and updated. AFP3 discussed: declined. Role of ultrasound in pregnancy discussed; fetal survey: ordered. Amniocentesis discussed: not indicated. Follow up in 4 weeks. 75% of 30 min visit spent on counseling and coordination of care.  -Oriented patient to our practice -Therapeutic  listening and recommendations given on handling [age] year old behaviors, coping strategies for tantrums.  -Patient will call if she is feeling overwhelmed and wants to be seen.  -Pap today.   Charlesetta GaribaldiKathryn Lorraine Janaria Mccammon CNM 01/28/2017

## 2017-01-29 LAB — DRUG SCREEN, URINE
AMPHETAMINES, URINE: NEGATIVE ng/mL
BARBITURATE SCREEN URINE: NEGATIVE ng/mL
BENZODIAZEPINE QUANT UR: NEGATIVE ng/mL
COCAINE (METAB.): NEGATIVE ng/mL
Cannabinoid Quant, Ur: POSITIVE ng/mL — AB
OPIATE QUANT UR: NEGATIVE ng/mL
PCP Quant, Ur: NEGATIVE ng/mL

## 2017-01-29 LAB — CYTOLOGY - PAP
Candida vaginitis: NEGATIVE
Chlamydia: POSITIVE — AB
DIAGNOSIS: NEGATIVE
Neisseria Gonorrhea: NEGATIVE
Trichomonas: NEGATIVE

## 2017-01-29 LAB — OBSTETRIC PANEL, INCLUDING HIV
Antibody Screen: NEGATIVE
BASOS ABS: 0 10*3/uL (ref 0.0–0.2)
Basos: 0 %
EOS (ABSOLUTE): 0.1 10*3/uL (ref 0.0–0.4)
Eos: 1 %
HEP B S AG: NEGATIVE
HIV SCREEN 4TH GENERATION: NONREACTIVE
Hematocrit: 35.6 % (ref 34.0–46.6)
Hemoglobin: 12.2 g/dL (ref 11.1–15.9)
IMMATURE GRANULOCYTES: 1 %
Immature Grans (Abs): 0.1 10*3/uL (ref 0.0–0.1)
LYMPHS ABS: 1.5 10*3/uL (ref 0.7–3.1)
Lymphs: 14 %
MCH: 31 pg (ref 26.6–33.0)
MCHC: 34.3 g/dL (ref 31.5–35.7)
MCV: 91 fL (ref 79–97)
MONOS ABS: 0.8 10*3/uL (ref 0.1–0.9)
Monocytes: 7 %
NEUTROS PCT: 77 %
Neutrophils Absolute: 8.5 10*3/uL — ABNORMAL HIGH (ref 1.4–7.0)
PLATELETS: 229 10*3/uL (ref 150–379)
RBC: 3.93 x10E6/uL (ref 3.77–5.28)
RDW: 13.2 % (ref 12.3–15.4)
RPR: NONREACTIVE
Rh Factor: POSITIVE
Rubella Antibodies, IGG: 2.29 index (ref >0.99)
WBC: 11 10*3/uL — AB (ref 3.4–10.8)

## 2017-01-30 LAB — URINE CULTURE, OB REFLEX

## 2017-01-30 LAB — CULTURE, OB URINE

## 2017-02-01 ENCOUNTER — Telehealth: Payer: Self-pay

## 2017-02-01 ENCOUNTER — Encounter: Payer: Self-pay | Admitting: Student

## 2017-02-01 ENCOUNTER — Telehealth: Payer: Self-pay | Admitting: Student

## 2017-02-01 ENCOUNTER — Other Ambulatory Visit: Payer: Self-pay | Admitting: Student

## 2017-02-01 DIAGNOSIS — Z3480 Encounter for supervision of other normal pregnancy, unspecified trimester: Secondary | ICD-10-CM

## 2017-02-01 DIAGNOSIS — O98812 Other maternal infectious and parasitic diseases complicating pregnancy, second trimester: Principal | ICD-10-CM

## 2017-02-01 DIAGNOSIS — A749 Chlamydial infection, unspecified: Secondary | ICD-10-CM

## 2017-02-01 MED ORDER — AZITHROMYCIN 250 MG PO TABS: 1000.0000 mg | ORAL_TABLET | Freq: Once | ORAL | 0 refills | Status: AC

## 2017-02-01 NOTE — Telephone Encounter (Signed)
[facility]

## 2017-02-01 NOTE — Telephone Encounter (Signed)
Patient notified; will fill rx once medicaid in place

## 2017-02-01 NOTE — Telephone Encounter (Signed)
error 

## 2017-02-02 ENCOUNTER — Other Ambulatory Visit: Payer: Self-pay | Admitting: Student

## 2017-02-02 ENCOUNTER — Encounter: Payer: Self-pay | Admitting: *Deleted

## 2017-02-02 ENCOUNTER — Ambulatory Visit (HOSPITAL_COMMUNITY)
Admission: RE | Admit: 2017-02-02 | Discharge: 2017-02-02 | Disposition: A | Discharge status: Home / Self Care | Payer: Medicaid Other | Source: Ambulatory Visit | Attending: Student | Admitting: Student

## 2017-02-02 DIAGNOSIS — Z3480 Encounter for supervision of other normal pregnancy, unspecified trimester: Secondary | ICD-10-CM

## 2017-02-02 DIAGNOSIS — Z3A22 22 weeks gestation of pregnancy: Secondary | ICD-10-CM

## 2017-02-02 DIAGNOSIS — Z363 Encounter for antenatal screening for malformations: Secondary | ICD-10-CM | POA: Diagnosis not present

## 2017-02-02 DIAGNOSIS — O99332 Smoking (tobacco) complicating pregnancy, second trimester: Secondary | ICD-10-CM | POA: Insufficient documentation

## 2017-02-04 LAB — SMN1 COPY NUMBER ANALYSIS (SMA CARRIER SCREENING)

## 2017-02-16 ENCOUNTER — Encounter: Payer: Self-pay | Admitting: *Deleted

## 2017-02-25 ENCOUNTER — Ambulatory Visit (INDEPENDENT_AMBULATORY_CARE_PROVIDER_SITE_OTHER): Payer: Self-pay | Admitting: Clinical

## 2017-02-25 ENCOUNTER — Telehealth: Payer: Self-pay | Admitting: Student

## 2017-02-25 ENCOUNTER — Ambulatory Visit (INDEPENDENT_AMBULATORY_CARE_PROVIDER_SITE_OTHER): Payer: Medicaid Other | Admitting: Student

## 2017-02-25 ENCOUNTER — Other Ambulatory Visit (HOSPITAL_COMMUNITY): Admission: RE | Admit: 2017-02-25 | Payer: Self-pay | Source: Ambulatory Visit

## 2017-02-25 VITALS — BP 138/96 | HR 96 | Wt 182.0 lb

## 2017-02-25 DIAGNOSIS — A749 Chlamydial infection, unspecified: Secondary | ICD-10-CM | POA: Diagnosis not present

## 2017-02-25 DIAGNOSIS — Z3480 Encounter for supervision of other normal pregnancy, unspecified trimester: Secondary | ICD-10-CM

## 2017-02-25 DIAGNOSIS — F17200 Nicotine dependence, unspecified, uncomplicated: Secondary | ICD-10-CM

## 2017-02-25 DIAGNOSIS — O98812 Other maternal infectious and parasitic diseases complicating pregnancy, second trimester: Secondary | ICD-10-CM

## 2017-02-25 DIAGNOSIS — F39 Unspecified mood [affective] disorder: Secondary | ICD-10-CM

## 2017-02-25 DIAGNOSIS — Z113 Encounter for screening for infections with a predominantly sexual mode of transmission: Secondary | ICD-10-CM

## 2017-02-25 DIAGNOSIS — Z818 Family history of other mental and behavioral disorders: Secondary | ICD-10-CM | POA: Diagnosis not present

## 2017-02-25 MED ORDER — SERTRALINE HCL 50 MG PO TABS: 50.0000 mg | ORAL_TABLET | Freq: Every day | ORAL | 2 refills | Status: DC

## 2017-02-25 NOTE — Progress Notes (Signed)
   PRENATAL VISIT NOTE  Subjective:  Patricia Warren is a 22 y.o. G2P1001 at 1430w0d being seen today for ongoing prenatal care.  She is currently monitored for the following issues for this low-risk pregnancy and has Smoker; Marijuana use; Supervision of other normal pregnancy, antepartum; Chlamydia infection affecting pregnancy in second trimester; and Family history of bipolar disorder on their problem list.  Patient reports like her mind is racing at times. Also feels very very down. Her sister is here with her, and supports that she is worried about Patricia Warren. Patricia Warren denies ob-gyn complaints. She is feeling better about her role as a mother; her daughter is sleeping better and that is helping with Patricia Warren overall stress. .  Contractions: Not present. Vag. Bleeding: None.  Movement: Present. Denies leaking of fluid.   The following portions of the patient's history were reviewed and updated as appropriate: allergies, current medications, past family history, past medical history, past social history, past surgical history and problem list. Problem list updated.  Objective:   Vitals:   02/25/17 1124  BP: (!) 138/96  Pulse: 96  Weight: 182 lb (82.6 kg)    Fetal Status: Fetal Heart Rate (bpm): 148 Fundal Height: 27 cm Movement: Present     General:  Alert, oriented and cooperative. Patient is in no acute distress.  Skin: Skin is warm and dry. No rash noted.   Cardiovascular: Normal heart rate noted  Respiratory: Normal respiratory effort, no problems with respiration noted  Abdomen: Soft, gravid, appropriate for gestational age.  Pain/Pressure: Absent     Pelvic: Cervical exam deferred        Extremities: Normal range of motion.  Edema: Trace  Mental Status:  Normal mood and affect. Normal behavior. Normal judgment and thought content.   Assessment and Plan:  Pregnancy: G2P1001 at 5930w0d  1. Supervision of other normal pregnancy, antepartum  - US MFM OB FOLLOW UP; Future -  Cervicovaginal ancillary only  2. Family history of bipolar disorder -to see jamie today; will not start Zoloft as patient's symptoms, if Bipolar, may be made worse by Zoloft. -see Asher MuirJamie note for plan of care.   3. Chlamydia infection affecting pregnancy in second trimester TOC today  4. Smoker -Knows she needs to quit but it is the only thing helping her stay calm right now.   Preterm labor symptoms and general obstetric precautions including but not limited to vaginal bleeding, contractions, leaking of fluid and fetal movement were reviewed in detail with the patient. Please refer to After Visit Summary for other counseling recommendations.  Return in about 2 weeks (around 03/11/2017), or LROB and 2 hour gtt and appointnment to see jamie.   Patricia Warren, CNM

## 2017-02-25 NOTE — Telephone Encounter (Signed)
Left VM for patient informing her not to fill RX for Zoloft bc it could make her feel worse but instead to follow Jamie's plan to see Family psych. Will also call pharmacy and cancel RX.   Luna KitchensKathryn Roselene Gray CNNM

## 2017-02-25 NOTE — BH Specialist Note (Signed)
Integrated Behavioral Health Initial Visit  MRN: [id number] Name: Patricia Warren  Number of Integrated Behavioral Health Clinician visits:: 1/6 Session Start time: 11:56  Session End time: 12:20 Total time: 20 minutes  Type of Service: Integrated Behavioral Health- Individual/Family Interpretor:No. Interpretor Name and Language: n/a   Warm Hand Off Completed.       SUBJECTIVE: Patricia Warren is a 22 y.o. female accompanied by Sister Patient was referred by Luna KitchensKathryn Kooistra, CNM for symptoms of anxiety and depression. Patient reports the following symptoms/concerns: Pt states that her primary concern today is feeling an increase in stress over being pregnant, is sleeping two hours or less at a time with racing thoughts, irritability, anxiety, worry, depression. Pt has gone up to 3-4 days without sleep in the past (after first child was born) "a couple of times", without feeling fatigued. Pt says there is a family history of bipolar affective disorder, that she has never talked about her feelings in the past, and is very open to preventing an escalation of symptoms. No SI, no HI in the past or current. Pt uses marijuana to cope with stress/anxiety/mood, as needed. Duration of problem: Increase in current pregnancy, mood fluctuations for over two years  Severity of problem: moderate  OBJECTIVE: Mood: Appropriate and Affect: Appropriate Risk of harm to self or others: No plan to harm self or others  LIFE CONTEXT: Family and Social: Pt lives with 24yo ; Supportive sister School/Work: - Self-Care: Recognizing a need for greater self-care; copes with mood with marijuana Life Changes: Current pregnancy  GOALS ADDRESSED: Patient will: 1. Reduce symptoms of: agitation, anxiety, depression, insomnia, mood instability and stress 2. Increase knowledge and/or ability of: self-management skills  3. Demonstrate ability to: Increase healthy adjustment to current life  circumstances  INTERVENTIONS: Interventions utilized: Solution-Focused Strategies and Psychoeducation and/or Health Education  Standardized Assessments completed: GAD-7 and PHQ 9  ASSESSMENT: Patient currently experiencing Mood disorder.   Patient may benefit from psychoeducation and brief therapeutic interventions regarding coping with symptoms of depression and anxiety related to mood instability. Marland Kitchen.  PLAN: 1. Follow up with behavioral health clinician on : Two weeks 2. Behavioral recommendations:  -[facility] to establish care, prior to next medical appointment in two weeks -Consider sleep app for improved sleep nightly, for the next two weeks -Consider applying for Medicaid transportation [facility] -Consider obtaining [place] reduced[place] within one month 3. Referral(s): Integrated [facility] Services (In Clinic), Community Mental Health Services (LME/Outside Clinic) and [facility] Resources:  Transportation and [facility] 4. "From scale of 1-10, how likely are you to follow plan?": 9  Valetta CloseJamie C Tashunda Vandezande, LCSW  Depression screen [facility] 2/9 01/28/2017  Decreased Interest 2  Down, Depressed, Hopeless 2  PHQ - 2 Score 4  Altered sleeping 1  Tired, decreased energy 3  Change in appetite 1  Feeling bad or failure about yourself  0  Trouble concentrating 0  Moving slowly or fidgety/restless 0  Suicidal thoughts 0  PHQ-9 Score 9   GAD 7 : Generalized Anxiety Score 01/28/2017  Nervous, Anxious, on Edge 3  Control/stop worrying 3  Worry too much - different things 2  Trouble relaxing 2  Restless 2  Easily annoyed or irritable 3  Afraid - awful might happen 2  Total GAD 7 Score 17

## 2017-02-25 NOTE — Patient Instructions (Signed)
What You Need to Know About Female Sterilization Female sterilization is surgery to prevent pregnancy. In this surgery, the fallopian tubes are either blocked or closed off. This prevents eggs from reaching the uterus so that the eggs cannot be fertilized by sperm and you cannot get pregnant. Sterilization is permanent. It should only be done if you are sure that you do not want to be able to have children. What are the sterilization surgery options? There are several kinds of female sterilization surgeries. They include:  Laparoscopic tubal ligation. In this surgery, the fallopian tubes are tied off, sealed with heat, or blocked with a clip, ring, or clamp. A small portion of each fallopian tube may also be removed. This surgery is done through several small cuts (incisions).  Postpartum tubal ligation. This is also called a mini-laparotomy. This surgery is done right after childbirth or 1 or 2 days after childbirth. In this surgery, the fallopian tubes are tied off, sealed with heat, or blocked with a clip, ring, or clamp. A small portion of each fallopian tube may also be removed. The surgery is done through a single incision.  Hysteroscopic sterilization. In this surgery, a tiny, spring-like coil is inserted through the cervix and uterus into the fallopian tubes. The coil causes scarring, which blocks the tubes. After the surgery, contraception should be used for 3 months to allow the scar tissue to form completely.  Is sterilization safe? Generally, sterilization is safe. Complications are rare. However, there are risks. They include:  Bleeding.  Infection.  Reaction to medicine used during the procedure.  Injury to surrounding organs.  Failure of the procedure.  How effective is sterilization? Sterilization is nearly 100% effective, but it can fail. Also, the fallopian tubes can grow back together over time. If this happens, you will be able to get pregnant again. Women who have had  this procedure have a higher chance of having an ectopic pregnancy. An ectopic pregnancy is a pregnancy that happens outside of the uterus. This kind of pregnancy is unsuccessful and can lead to serious bleeding if it is not treated. What are the benefits?  It is usually effective for a lifetime.  It is usually safe.  It does not have the drawbacks of other types of birth control: That means: ? Your hormones are not affected. Because of this, your menstrual periods, sexual desire, and sexual performance will not be affected. ? There are no side effects. What are the drawbacks?  If you change your mind and decide that you want to have children, you may not be able to. Sterilization may be reversed, but a reversal is not always successful.  It does not provide protection against STDs (sexually transmitted diseases).  It increases the chance of having an ectopic pregnancy. This information is not intended to replace advice given to you by your health care provider. Make sure you discuss any questions you have with your health care provider. Document Released: 06/10/2007 Document Revised: 08/15/2015 Document Reviewed: 09/18/2014 Elsevier Interactive Patient Education  2018 Elsevier Inc.  

## 2017-02-26 LAB — CERVICOVAGINAL ANCILLARY ONLY
Chlamydia: NEGATIVE
NEISSERIA GONORRHEA: NEGATIVE
Trichomonas: NEGATIVE

## 2017-03-16 ENCOUNTER — Other Ambulatory Visit: Payer: Self-pay | Admitting: General Practice

## 2017-03-16 DIAGNOSIS — Z3480 Encounter for supervision of other normal pregnancy, unspecified trimester: Secondary | ICD-10-CM

## 2017-03-17 ENCOUNTER — Encounter: Payer: Self-pay | Admitting: Advanced Practice Midwife

## 2017-03-17 ENCOUNTER — Telehealth: Payer: Self-pay | Admitting: Family Medicine

## 2017-03-17 ENCOUNTER — Other Ambulatory Visit: Payer: Self-pay

## 2017-03-17 NOTE — Telephone Encounter (Signed)
Patient came in today with her child due to the Fle restrictions we rsch her appt.  I asked the patient to go ahead and take her child out and I will call her with her appt.  Called patient five mins later and she did not answer a detailed message was left about her new appt date and time.

## 2017-03-25 ENCOUNTER — Encounter: Payer: Self-pay | Admitting: Student

## 2017-03-25 ENCOUNTER — Other Ambulatory Visit: Payer: Self-pay | Admitting: *Deleted

## 2017-03-25 ENCOUNTER — Ambulatory Visit (HOSPITAL_COMMUNITY)
Admission: RE | Admit: 2017-03-25 | Discharge: 2017-03-25 | Disposition: A | Discharge status: Home / Self Care | Payer: Medicaid Other | Source: Ambulatory Visit | Attending: Student | Admitting: Student

## 2017-03-25 ENCOUNTER — Other Ambulatory Visit: Payer: Self-pay

## 2017-04-02 ENCOUNTER — Encounter: Payer: Self-pay | Admitting: Medical

## 2017-04-02 ENCOUNTER — Ambulatory Visit (INDEPENDENT_AMBULATORY_CARE_PROVIDER_SITE_OTHER): Payer: Medicaid Other | Admitting: Medical

## 2017-04-02 ENCOUNTER — Ambulatory Visit (INDEPENDENT_AMBULATORY_CARE_PROVIDER_SITE_OTHER): Payer: Self-pay | Admitting: Clinical

## 2017-04-02 ENCOUNTER — Encounter: Payer: Self-pay | Admitting: Obstetrics & Gynecology

## 2017-04-02 VITALS — BP 134/78 | HR 82 | Wt 196.1 lb

## 2017-04-02 DIAGNOSIS — O99343 Other mental disorders complicating pregnancy, third trimester: Secondary | ICD-10-CM

## 2017-04-02 DIAGNOSIS — O26893 Other specified pregnancy related conditions, third trimester: Secondary | ICD-10-CM

## 2017-04-02 DIAGNOSIS — R12 Heartburn: Secondary | ICD-10-CM | POA: Diagnosis not present

## 2017-04-02 DIAGNOSIS — Z23 Encounter for immunization: Secondary | ICD-10-CM

## 2017-04-02 DIAGNOSIS — F32A Depression, unspecified: Secondary | ICD-10-CM

## 2017-04-02 DIAGNOSIS — Z3480 Encounter for supervision of other normal pregnancy, unspecified trimester: Secondary | ICD-10-CM

## 2017-04-02 DIAGNOSIS — F39 Unspecified mood [affective] disorder: Secondary | ICD-10-CM

## 2017-04-02 DIAGNOSIS — F329 Major depressive disorder, single episode, unspecified: Secondary | ICD-10-CM

## 2017-04-02 MED ORDER — FAMOTIDINE 20 MG PO TABS: 20.0000 mg | ORAL_TABLET | Freq: Two times a day (BID) | ORAL | 3 refills | Status: DC

## 2017-04-02 NOTE — Patient Instructions (Addendum)
Fetal Movement Counts Patient Name: ________________________________________________ Patient Due Date: ____________________ What is a fetal movement count? A fetal movement count is the number of times that you feel your baby move during a certain amount of time. This may also be called a fetal kick count. A fetal movement count is recommended for every pregnant woman. You may be asked to start counting fetal movements as early as week 28 of your pregnancy. Pay attention to when your baby is most active. You may notice your baby's sleep and wake cycles. You may also notice things that make your baby move more. You should do a fetal movement count:  When your baby is normally most active.  At the same time each day.  A good time to count movements is while you are resting, after having something to eat and drink. How do I count fetal movements? 1. Find a quiet, comfortable area. Sit, or lie down on your side. 2. Write down the date, the start time and stop time, and the number of movements that you felt between those two times. Take this information with you to your health care visits. 3. For 2 hours, count kicks, flutters, swishes, rolls, and jabs. You should feel at least 10 movements during 2 hours. 4. You may stop counting after you have felt 10 movements. 5. If you do not feel 10 movements in 2 hours, have something to eat and drink. Then, keep resting and counting for 1 hour. If you feel at least 4 movements during that hour, you may stop counting. Contact a health care provider if:  You feel fewer than 4 movements in 2 hours.  Your baby is not moving like he or she usually does. Date: ____________ Start time: ____________ Stop time: ____________ Movements: ____________ Date: ____________ Start time: ____________ Stop time: ____________ Movements: ____________ Date: ____________ Start time: ____________ Stop time: ____________ Movements: ____________ Date: ____________ Start time:  ____________ Stop time: ____________ Movements: ____________ Date: ____________ Start time: ____________ Stop time: ____________ Movements: ____________ Date: ____________ Start time: ____________ Stop time: ____________ Movements: ____________ Date: ____________ Start time: ____________ Stop time: ____________ Movements: ____________ Date: ____________ Start time: ____________ Stop time: ____________ Movements: ____________ Date: ____________ Start time: ____________ Stop time: ____________ Movements: ____________ This information is not intended to replace advice given to you by your health care provider. Make sure you discuss any questions you have with your health care provider. Document Released: 01/21/2006 Document Revised: 08/21/2015 Document Reviewed: 01/31/2015 Elsevier Interactive Patient Education  2018 [employer].  [employer] of the uterus can occur throughout pregnancy, but they are not always a sign that you are in labor. You may have practice contractions called Braxton Hicks contractions. These false labor contractions are sometimes confused with true labor. What are [place] contractions? Braxton Hicks contractions are tightening movements that occur in the muscles of the uterus before labor. Unlike true labor contractions, these contractions do not result in opening (dilation) and thinning of the cervix. Toward the end of pregnancy (32-34 weeks), Braxton Hicks contractions can happen more often and may become stronger. These contractions are sometimes difficult to tell apart from true labor because they can be very uncomfortable. You should not feel embarrassed if you go to the hospital with false labor. Sometimes, the only way to tell if you are in true labor is for your health care provider to look for changes in the cervix. The health care provider will do a physical exam and may monitor your contractions.  If you are not in true labor, the exam  should show that your cervix is not dilating and your water has not broken. If there are other health problems associated with your pregnancy, it is completely safe for you to be sent home with false labor. You may continue to have Braxton Hicks contractions until you go into true labor. How to tell the difference between true labor and false labor True labor  Contractions last 30-70 seconds.  Contractions become very regular.  Discomfort is usually felt in the top of the uterus, and it spreads to the lower abdomen and low back.  Contractions do not go away with walking.  Contractions usually become more intense and increase in frequency.  The cervix dilates and gets thinner. False labor  Contractions are usually shorter and not as strong as true labor contractions.  Contractions are usually irregular.  Contractions are often felt in the front of the lower abdomen and in the groin.  Contractions may go away when you walk around or change positions while lying down.  Contractions get weaker and are shorter-lasting as time goes on.  The cervix usually does not dilate or become thin. Follow these instructions at home:  Take over-the-counter and prescription medicines only as told by your health care provider.  Keep up with your usual exercises and follow other instructions from your health care provider.  Eat and drink lightly if you think you are going into labor.  If Braxton Hicks contractions are making you uncomfortable: ? Change your position from lying down or resting to walking, or change from walking to resting. ? Sit and rest in a tub of warm water. ? Drink enough fluid to keep your urine pale yellow. Dehydration may cause these contractions. ? Do slow and deep breathing several times an hour.  Keep all follow-up prenatal visits as told by your health care provider. This is important. Contact a health care provider if:  You have a fever.  You have continuous pain  in your abdomen. Get help right away if:  Your contractions become stronger, more regular, and closer together.  You have fluid leaking or gushing from your vagina.  You pass blood-tinged mucus (bloody show).  You have bleeding from your vagina.  You have low back pain that you never had before.  You feel your baby's head pushing down and causing pelvic pressure.  Your baby is not moving inside you as much as it used to. Summary  Contractions that occur before labor are called Braxton Hicks contractions, false labor, or practice contractions.  Braxton Hicks contractions are usually shorter, weaker, farther apart, and less regular than true labor contractions. True labor contractions usually become progressively stronger and regular and they become more frequent.  Manage discomfort from [facility] contractions by changing position, resting in a warm bath, drinking plenty of water, or practicing deep breathing. This information is not intended to replace advice given to you by your health care provider. Make sure you discuss any questions you have with your health care provider. Document Released: 05/07/2016 Document Revised: 05/07/2016 Document Reviewed: 05/07/2016 Elsevier Interactive Patient Education  2018 [employer].    Food Choices for Gastroesophageal Reflux Disease, Adult When you have gastroesophageal reflux disease (GERD), the foods you eat and your eating habits are very important. Choosing the right foods can help ease your discomfort. What guidelines do I need to follow?  Choose fruits, vegetables, whole grains, and low-fat dairy products.  Choose low-fat meat, fish, and  poultry.  Limit fats such as oils, salad dressings, butter, nuts, and avocado.  Keep a food diary. This helps you identify foods that cause symptoms.  Avoid foods that cause symptoms. These may be different for everyone.  Eat small meals often instead of 3 large meals a day.  Eat your  meals slowly, in a place where you are relaxed.  Limit fried foods.  Cook foods using methods other than frying.  Avoid drinking alcohol.  Avoid drinking large amounts of liquids with your meals.  Avoid bending over or lying down until 2-3 hours after eating. What foods are not recommended? These are some foods and drinks that may make your symptoms worse: Vegetables Tomatoes. Tomato juice. Tomato and spaghetti sauce. Chili peppers. Onion and garlic. Horseradish. Fruits Oranges, grapefruit, and lemon (fruit and juice). Meats High-fat meats, fish, and poultry. This includes hot dogs, ribs, ham, sausage, salami, and bacon. Dairy Whole milk and chocolate milk. Sour cream. Cream. Butter. Ice cream. Cream cheese. Drinks Coffee and tea. Bubbly (carbonated) drinks or energy drinks. Condiments Hot sauce. Barbecue sauce. Sweets/Desserts Chocolate and cocoa. Donuts. Peppermint and spearmint. Fats and Oils High-fat foods. This includes [place] fries and potato chips. Other Vinegar. Strong spices. This includes black pepper, white pepper, red pepper, cayenne, curry powder, cloves, ginger, and chili powder. The items listed above may not be a complete list of foods and drinks to avoid. Contact your dietitian for more information. This information is not intended to replace advice given to you by your health care provider. Make sure you discuss any questions you have with your health care provider. Document Released: 06/23/2011 Document Revised: 05/30/2015 Document Reviewed: 10/26/2012 Elsevier Interactive Patient Education  2017 [employer]

## 2017-04-02 NOTE — Progress Notes (Signed)
   PRENATAL VISIT NOTE  Subjective:  Patricia Warren is a 22 y.o. G2P1001 at 5038w1d being seen today for ongoing prenatal care.  She is currently monitored for the following issues for this low-risk pregnancy and has Smoker; Marijuana use; Supervision of other normal pregnancy, antepartum; Chlamydia infection affecting pregnancy in second trimester; and Family history of bipolar disorder on their problem list.  Patient reports heartburn, occasional round ligament pain, depression symptoms.  Contractions: Not present. Vag. Bleeding: None.  Movement: Present. Denies leaking of fluid.   The following portions of the patient's history were reviewed and updated as appropriate: allergies, current medications, past family history, past medical history, past social history, past surgical history and problem list. Problem list updated.  Objective:   Vitals:   04/02/17 1003  BP: 134/78  Pulse: 82  Weight: 196 lb 1.6 oz (89 kg)    Fetal Status: Fetal Heart Rate (bpm): 140 Fundal Height: 31 cm Movement: Present     General:  Alert, oriented and cooperative. Patient is in no acute distress.  Skin: Skin is warm and dry. No rash noted.   Cardiovascular: Normal heart rate noted  Respiratory: Normal respiratory effort, no problems with respiration noted  Abdomen: Soft, gravid, appropriate for gestational age.  Pain/Pressure: Present     Pelvic: Cervical exam deferred        Extremities: Normal range of motion.  Edema: Trace  Mental Status:  Normal mood and affect. Normal behavior. Normal judgment and thought content.   Assessment and Plan:  Pregnancy: G2P1001 at 1338w1d  1. Supervision of other normal pregnancy, antepartum - CBC - Glucose Tolerance, 2 Hours w/1 Hour - HIV antibody - RPR - Tdap vaccine greater than or equal to 7yo IM  2. Depression affecting pregnancy in third trimester, antepartum - Ambulatory referral to Integrated Behavioral Health  3. Heartburn in pregnancy in third  trimester - GERD diet included on AVS - famotidine (PEPCID) 20 MG tablet; Take 1 tablet (20 mg total) by mouth 2 (two) times daily.  Dispense: 60 tablet; Refill: 3  Preterm labor symptoms and general obstetric precautions including but not limited to vaginal bleeding, contractions, leaking of fluid and fetal movement were reviewed in detail with the patient. Please refer to After Visit Summary for other counseling recommendations.  Return in about 2 weeks (around 04/16/2017) for LOB.   Vonzella NippleJulie Yulianna Folse, PA-C

## 2017-04-02 NOTE — BH Specialist Note (Signed)
Integrated Behavioral Health Initial Visit  MRN: [id number] Name: Patricia BrakemanSativa Warren  Number of Integrated Behavioral Health Clinician visits:: 2/6 Session Start time: 10:44  Session End time: 11:02 Total time: 20 minutes  Type of Service: Integrated Behavioral Health- Individual/Family Interpretor:No. Interpretor Name and Language: n/a   Warm Hand Off Completed.       SUBJECTIVE: Patricia BrakemanSativa Warren is a 22 y.o. female accompanied by n/a Patient was referred by Vonzella NippleJulie Wenzel, PA-C for anxiety and depression Patient reports the following symptoms/concerns: Pt slept 5 hours last night, "the most in weeks", feels like she is tired and taking naps off and on all day, with no appetite until early evening, irritability and worrying about this pregnancy. Pt is spending time outdoors daily, watching her daughter play, and [facility] She is receiving WIC, taking her prenatal vitamins, and is still "working [employer]" Medicaid paperwork.  Duration of problem: Increased current pregnancy; over two years of mood changes; Severity of problem: moderate  OBJECTIVE: Mood: Anxious and Affect: Appropriate Risk of harm to self or others: No plan to harm self or others  LIFE CONTEXT: Family and Social: Pt lives with 24yo daugher; sister is supportive School/Work: Actively seeking employment; working with job agencies Self-Care: No current substances; actively trying self-coping strategies, along with setting up ongoing therapy for herself Life Changes: Current pregnancy  GOALS ADDRESSED: Patient will: 1. Reduce symptoms of: agitation, anxiety, depression, insomnia, mood instability and stress 2. Increase knowledge and/or ability of: self-management skills  3. Demonstrate ability to: Increase healthy adjustment to current life circumstances  INTERVENTIONS: Interventions utilized: Solution-Focused Strategies and Functional Assessment of ADLs   Standardized Assessments completed: GAD-7 and PHQ 9  ASSESSMENT: Patient currently experiencing Mood disorder.   Patient may benefit from continued brief therapeutic interventions regarding coping with symptoms of anxiety and depression related to mood instability.  PLAN: 1. Follow up with behavioral health clinician on : Two weeks 2. Behavioral recommendations:  -[facility] (If unable to wait two weeks, use walk-in clinic for same-day service) -Complete Medicaid paperwork and turn in to DSS (specify need for transportation) -Consider eating at least one bite of food at least every 4 hours during the day -Continue taking daytime naps, as needed 3. Referral(s): [facility] (In Clinic) and [facility] ([facility]Outside [facility]) 4. "From scale of 1-10, how likely are you to follow plan?": 8  Rae LipsJamie C Aldric Wenzler, LCSW  Dep[facility] screen [facility] 04/02/2017 01/28/2017  Decreased Interest 2 2  Down, Depressed, Hopeless 2 2  PHQ - 2 Score 4 4  Altered sleeping 3 1  Tired, decreased energy 2 3  Change in appetite 3 1  Feeling bad or failure about yourself  1 0  Trouble concentrating 1 0  Moving slowly or fidgety/restless 0 0  Suicidal thoughts 0 0  PHQ-9 Score 14 9   GAD 7 : Generalized Anxiety Score 04/02/2017 01/28/2017  Nervous, Anxious, on Edge 2 3  Control/stop worrying 2 3  Worry too much - different things 3 2  Trouble relaxing 2 2  Restless 2 2  Easily annoyed or irritable 3 3  Afraid - awful might happen 1 2  Total GAD 7 Score 15 17

## 2017-04-03 LAB — RPR: RPR: NONREACTIVE

## 2017-04-03 LAB — GLUCOSE TOLERANCE, 2 HOURS W/ 1HR
GLUCOSE, 1 HOUR: 134 mg/dL (ref 65–179)
GLUCOSE, FASTING: 73 mg/dL (ref 65–91)
Glucose, 2 hour: 71 mg/dL (ref 65–152)

## 2017-04-03 LAB — CBC
Hematocrit: 35.8 % (ref 34.0–46.6)
Hemoglobin: 12 g/dL (ref 11.1–15.9)
MCH: 30.8 pg (ref 26.6–33.0)
MCHC: 33.5 g/dL (ref 31.5–35.7)
MCV: 92 fL (ref 79–97)
Platelets: 194 10*3/uL (ref 150–379)
RBC: 3.89 x10E6/uL (ref 3.77–5.28)
RDW: 14.3 % (ref 12.3–15.4)
WBC: 8.9 10*3/uL (ref 3.4–10.8)

## 2017-04-03 LAB — HIV ANTIBODY (ROUTINE TESTING W REFLEX): HIV SCREEN 4TH GENERATION: NONREACTIVE

## 2017-04-27 ENCOUNTER — Encounter: Payer: Self-pay | Admitting: Obstetrics and Gynecology

## 2017-05-04 ENCOUNTER — Encounter: Payer: Self-pay | Admitting: Advanced Practice Midwife

## 2017-05-09 ENCOUNTER — Inpatient Hospital Stay (HOSPITAL_COMMUNITY)
Admission: AD | Admit: 2017-05-09 | Discharge: 2017-05-10 | Disposition: A | Discharge status: Home / Self Care | Payer: Medicaid Other | Source: Ambulatory Visit | Attending: Obstetrics & Gynecology | Admitting: Obstetrics & Gynecology

## 2017-05-09 ENCOUNTER — Encounter (HOSPITAL_COMMUNITY): Payer: Self-pay | Admitting: *Deleted

## 2017-05-09 DIAGNOSIS — M5442 Lumbago with sciatica, left side: Secondary | ICD-10-CM | POA: Insufficient documentation

## 2017-05-09 DIAGNOSIS — F17210 Nicotine dependence, cigarettes, uncomplicated: Secondary | ICD-10-CM | POA: Insufficient documentation

## 2017-05-09 DIAGNOSIS — O9989 Other specified diseases and conditions complicating pregnancy, childbirth and the puerperium: Secondary | ICD-10-CM

## 2017-05-09 DIAGNOSIS — O26893 Other specified pregnancy related conditions, third trimester: Secondary | ICD-10-CM | POA: Insufficient documentation

## 2017-05-09 DIAGNOSIS — M545 Low back pain: Secondary | ICD-10-CM | POA: Diagnosis present

## 2017-05-09 DIAGNOSIS — M549 Dorsalgia, unspecified: Secondary | ICD-10-CM

## 2017-05-09 DIAGNOSIS — O99333 Smoking (tobacco) complicating pregnancy, third trimester: Secondary | ICD-10-CM | POA: Insufficient documentation

## 2017-05-09 DIAGNOSIS — Z3A36 36 weeks gestation of pregnancy: Secondary | ICD-10-CM | POA: Insufficient documentation

## 2017-05-09 DIAGNOSIS — M5432 Sciatica, left side: Secondary | ICD-10-CM | POA: Diagnosis not present

## 2017-05-09 HISTORY — DX: Chlamydial infection, unspecified: A74.9

## 2017-05-09 HISTORY — DX: Trichomoniasis, unspecified: A59.9

## 2017-05-09 HISTORY — DX: Other maternal infectious and parasitic diseases complicating pregnancy, first trimester: O98.811

## 2017-05-09 LAB — URINALYSIS, ROUTINE W REFLEX MICROSCOPIC
Bilirubin Urine: NEGATIVE
GLUCOSE, UA: NEGATIVE mg/dL
Hgb urine dipstick: NEGATIVE
KETONES UR: 5 mg/dL — AB
Nitrite: NEGATIVE
PH: 6 (ref 5.0–8.0)
Protein, ur: NEGATIVE mg/dL
SPECIFIC GRAVITY, URINE: 1.031 — AB (ref 1.005–1.030)

## 2017-05-09 MED ORDER — OXYCODONE-ACETAMINOPHEN 5-325 MG PO TABS
1.0000 | ORAL_TABLET | Freq: Once | ORAL | Status: DC
Start: 2017-05-10 — End: 2017-05-10

## 2017-05-09 MED ORDER — CYCLOBENZAPRINE HCL 5 MG PO TABS
5.0000 mg | ORAL_TABLET | Freq: Once | ORAL | Status: AC
  Administered 2017-05-09: 5 mg via ORAL
  Filled 2017-05-09: qty 1

## 2017-05-09 NOTE — MAU Provider Note (Signed)
History     CSN: [id number]  Arrival date and time: 05/09/17 2106   First Provider Initiated Contact with Patient 05/09/17 2217      Chief Complaint  Patient presents with  . Back Pain  . Edema   HPI Patricia Warren is a 22 y.o. G2P1001 at [redacted]w[redacted]d who presents with lower back pain and feet swelling. She states the lower back pain has been ongoing for 3 weeks. She describes it as sharp and shooting and runs down the back of her leg. She rates the pain a 7/10 and tried ibuprofen last week with no relief. She also reports an increase in swelling in her feet. Denies any vaginal bleeding or leaking of fluid. Reports good fetal movement.   OB History    Gravida  2   Para  1   Term  1   Preterm      AB      Living  1     SAB      TAB      Ectopic      Multiple  0   Live Births  1           Past Medical History:  Diagnosis Date  . Chlamydia infection affecting pregnancy in first trimester, antepartum   . Medical history non-contributory   . Mild obesity   . Smoker   . Trichimoniasis     Past Surgical History:  Procedure Laterality Date  . NO PAST SURGERIES    . WISDOM TOOTH EXTRACTION     2012    Family History  Problem Relation Age of Onset  . Asthma Brother     Social History   Tobacco Use  . Smoking status: Current Every Day Smoker    Packs/day: 0.25    Types: Cigarettes  . Smokeless tobacco: Never Used  Substance Use Topics  . Alcohol use: Not Currently    Alcohol/week: 0.6 oz    Types: 1 Glasses of wine per week    Frequency: Never    Comment: early April 2019  . Drug use: No    Types: Marijuana    Comment: last use Mid april 2019    Allergies: No Known Allergies  Medications Prior to Admission  Medication Sig Dispense Refill Last Dose  . famotidine (PEPCID) 20 MG tablet Take 1 tablet (20 mg total) by mouth 2 (two) times daily. 60 tablet 3 05/08/2017 at Unknown time  . ibuprofen (ADVIL,MOTRIN) 400 MG tablet Take 400 mg by mouth every 6  (six) hours as needed.   Past Week at Unknown time  . Prenatal Vit-Fe Fumarate-FA (PRENATAL VITAMINS) 28-0.8 MG TABS Take 28 mg by mouth daily with breakfast. 30 tablet 4 05/08/2017 at Unknown time    Review of Systems  Constitutional: Negative.  Negative for fatigue and fever.  HENT: Negative.   Respiratory: Negative.  Negative for shortness of breath.   Cardiovascular: Negative.  Negative for chest pain.  Gastrointestinal: Negative.  Negative for abdominal pain, constipation, diarrhea, nausea and vomiting.  Genitourinary: Negative.  Negative for dysuria, vaginal bleeding and vaginal discharge.  Musculoskeletal: Positive for back pain.       Edema  Neurological: Negative.  Negative for dizziness and headaches.   Physical Exam   Blood pressure 128/79, pulse 86, temperature 98.3 F (36.8 C), temperature source Oral, resp. rate 20, height  (1.753 m), weight 216 lb (98 kg), last menstrual period 09/22/2016, SpO2 97 %.  Physical Exam  Nursing note and vitals  reviewed. Constitutional: She is oriented to person, place, and time. She appears well-developed and well-nourished. No distress.  HENT:  Head: Normocephalic.  Eyes: Pupils are equal, round, and reactive to light.  Cardiovascular: Normal rate, regular rhythm and normal heart sounds.  Respiratory: Effort normal and breath sounds normal. No respiratory distress.  GI: Soft. Bowel sounds are normal. She exhibits no distension. There is no tenderness.  Musculoskeletal: She exhibits edema (bilateral 2+ edema in feet).  Neurological: She is alert and oriented to person, place, and time.  Skin: Skin is warm and dry.  Psychiatric: She has a normal mood and affect. Her behavior is normal. Judgment and thought content normal.   Fetal Tracing:  Baseline: 125 Variability: moderate Accels: 15x15 Decels: none  Toco: none  MAU Course  Procedures Results for orders placed or performed during the hospital encounter of 05/09/17 (from the  past 24 hour(s))  Urinalysis, Routine w reflex microscopic     Status: Abnormal   Collection Time: 05/09/17  9:39 PM  Result Value Ref Range   Color, Urine YELLOW YELLOW   APPearance HAZY (A) CLEAR   Specific Gravity, Urine 1.031 (H) 1.005 - 1.030   pH 6.0 5.0 - 8.0   Glucose, UA NEGATIVE NEGATIVE mg/dL   Hgb urine dipstick NEGATIVE NEGATIVE   Bilirubin Urine NEGATIVE NEGATIVE   Ketones, ur 5 (A) NEGATIVE mg/dL   Protein, ur NEGATIVE NEGATIVE mg/dL   Nitrite NEGATIVE NEGATIVE   Leukocytes, UA MODERATE (A) NEGATIVE   RBC / HPF 0-5 0 - 5 RBC/hpf   WBC, UA 6-10 0 - 5 WBC/hpf   Bacteria, UA RARE (A) NONE SEEN   Squamous Epithelial / LPF 0-5 0 - 5   Mucus PRESENT    MDM UA, UC Flexeril PO Patient requesting discharge after flexeril. Refused cervical exam.  Assessment and Plan   1. Sciatica of left side   2. Back pain affecting pregnancy in third trimester    -Discharge home in stable condition -Rx for limited number of flexeril given to patient -Preterm labor precautions discussed. Exercises for sciatica reviewed -Patient advised to follow-up with [facility] asap to reestablish prenatal care -Patient may return to MAU as needed or if her condition were to change or worsen   Rolm Bookbinder CNM 05/09/2017, 10:17 PM

## 2017-05-09 NOTE — MAU Note (Signed)
Pt reports lower back pain that started a few weeks ago. Pt reports that it hurts every time she walks. Pt states she took ibuprofen and has used "things" under back thinking it was the position. States that it "feels like it locked up" tonight. Also reports swelling in feet for several days now.

## 2017-05-10 DIAGNOSIS — M549 Dorsalgia, unspecified: Secondary | ICD-10-CM

## 2017-05-10 DIAGNOSIS — O9989 Other specified diseases and conditions complicating pregnancy, childbirth and the puerperium: Secondary | ICD-10-CM

## 2017-05-10 DIAGNOSIS — M5432 Sciatica, left side: Secondary | ICD-10-CM

## 2017-05-10 MED ORDER — CYCLOBENZAPRINE HCL 5 MG PO TABS: 5.0000 mg | ORAL_TABLET | Freq: Three times a day (TID) | ORAL | 0 refills | Status: DC | PRN

## 2017-05-10 NOTE — Discharge Instructions (Signed)

## 2017-05-11 LAB — CULTURE, OB URINE

## 2017-05-18 ENCOUNTER — Other Ambulatory Visit (HOSPITAL_COMMUNITY)
Admission: RE | Admit: 2017-05-18 | Discharge: 2017-05-18 | Disposition: A | Discharge status: Home / Self Care | Payer: Medicaid Other | Source: Ambulatory Visit | Attending: Obstetrics and Gynecology | Admitting: Obstetrics and Gynecology

## 2017-05-18 ENCOUNTER — Ambulatory Visit (INDEPENDENT_AMBULATORY_CARE_PROVIDER_SITE_OTHER): Payer: Medicaid Other | Admitting: Obstetrics and Gynecology

## 2017-05-18 VITALS — BP 149/82 | HR 99 | Wt 201.9 lb

## 2017-05-18 DIAGNOSIS — Z3480 Encounter for supervision of other normal pregnancy, unspecified trimester: Secondary | ICD-10-CM

## 2017-05-18 DIAGNOSIS — Z3483 Encounter for supervision of other normal pregnancy, third trimester: Secondary | ICD-10-CM | POA: Insufficient documentation

## 2017-05-18 DIAGNOSIS — R030 Elevated blood-pressure reading, without diagnosis of hypertension: Secondary | ICD-10-CM

## 2017-05-18 LAB — POCT URINALYSIS DIP (DEVICE)
Glucose, UA: NEGATIVE mg/dL
HGB URINE DIPSTICK: NEGATIVE
NITRITE: NEGATIVE
Protein, ur: 30 mg/dL — AB
Specific Gravity, Urine: >=1.03 (ref 1.005–1.030)
UROBILINOGEN UA: 0.2 mg/dL (ref 0.0–1.0)
pH: 6 (ref 5.0–8.0)

## 2017-05-18 LAB — OB RESULTS CONSOLE GBS: STREP GROUP B AG: POSITIVE

## 2017-05-18 NOTE — Patient Instructions (Addendum)
Intrauterine Device Information An intrauterine device (IUD) is inserted into your uterus to prevent pregnancy. There are two types of IUDs available:  Copper IUD-This type of IUD is wrapped in copper wire and is placed inside the uterus. Copper makes the uterus and fallopian tubes produce a fluid that kills sperm. The copper IUD can stay in place for 10 years.  Hormone IUD-This type of IUD contains the hormone progestin (synthetic progesterone). The hormone thickens the cervical mucus and prevents sperm from entering the uterus. It also thins the uterine lining to prevent implantation of a fertilized egg. The hormone can weaken or kill the sperm that get into the uterus. One type of hormone IUD can stay in place for 5 years, and another type can stay in place for 3 years.  Your health care provider will make sure you are a good candidate for a contraceptive IUD. Discuss with your health care provider the possible side effects. Advantages of an intrauterine device  IUDs are highly effective, reversible, long acting, and low maintenance.  There are no estrogen-related side effects.  An IUD can be used when breastfeeding.  IUDs are not associated with weight gain.  The copper IUD works immediately after insertion.  The hormone IUD works right away if inserted within 7 days of your period starting. You will need to use a backup method of birth control for 7 days if the hormone IUD is inserted at any other time in your cycle.  The copper IUD does not interfere with your female hormones.  The hormone IUD can make heavy menstrual periods lighter and decrease cramping.  The hormone IUD can be used for 3 or 5 years.  The copper IUD can be used for 10 years. Disadvantages of an intrauterine device  The hormone IUD can be associated with irregular bleeding patterns.  The copper IUD can make your menstrual flow heavier and more painful.  You may experience cramping and vaginal bleeding after  insertion. This information is not intended to replace advice given to you by your health care provider. Make sure you discuss any questions you have with your health care provider. Document Released: 11/26/2003 Document Revised: 05/30/2015 Document Reviewed: 06/12/2012 Elsevier Interactive Patient Education  2017 Elsevier Inc.   Hemorrhoids Hemorrhoids are swollen veins in and around the rectum or anus. Hemorrhoids can cause pain, itching, or bleeding. Most of the time, they do not cause serious problems. They usually get better with diet changes, lifestyle changes, and other home treatments. Follow these instructions at home: Eating and drinking  Eat foods that have fiber, such as whole grains, beans, nuts, fruits, and vegetables. Ask your doctor about taking products that have added fiber (fibersupplements).  Drink enough fluid to keep your pee (urine) clear or pale yellow. For Pain and Swelling  Take a warm-water bath (sitz bath) for 20 minutes to ease pain. Do this 3-4 times a day.  If directed, put ice on the painful area. It may be helpful to use ice between your warm baths. ? Put ice in a plastic bag. ? Place a towel between your skin and the bag. ? Leave the ice on for 20 minutes, 2-3 times a day. General instructions  Take over-the-counter and prescription medicines only as told by your doctor. ? Medicated creams and medicines that are inserted into the anus (suppositories) may be used or applied as told.  Exercise often.  Go to the bathroom when you have the urge to poop (to have a bowel movement).  Do not wait.  Avoid pushing too hard (straining) when you poop.  Keep the butt area dry and clean. Use wet toilet paper or moist paper towels.  Do not sit on the toilet for a long time. Contact a doctor if:  You have any of these: ? Pain and swelling that do not get better with treatment or medicine. ? Bleeding that will not stop. ? Trouble pooping or you cannot  poop. ? Pain or swelling outside the area of the hemorrhoids. This information is not intended to replace advice given to you by your health care provider. Make sure you discuss any questions you have with your health care provider. Document Released: 10/01/2007 Document Revised: 05/30/2015 Document Reviewed: 09/05/2014 Elsevier Interactive Patient Education  [employer].

## 2017-05-18 NOTE — Progress Notes (Signed)
Subjective:  Patricia Warren is a 22 y.o. G2P1001 at [redacted]w[redacted]d being seen today for ongoing prenatal care.  She is currently monitored for the following issues for this low-risk pregnancy and has Smoker; Marijuana use; Supervision of other normal pregnancy, antepartum; Chlamydia infection affecting pregnancy in second trimester; and Family history of bipolar disorder on their problem list.  Patient reports no complaints.  Contractions: Irritability. Vag. Bleeding: None.  Movement: Present. Denies leaking of fluid.   The following portions of the patient's history were reviewed and updated as appropriate: allergies, current medications, past family history, past medical history, past social history, past surgical history and problem list. Problem list updated.  Objective:   Vitals:   05/18/17 1457 05/18/17 1458  BP: (!) 171/88 (!) 149/82  Pulse: 100 99  Weight: 201 lb 14.4 oz (91.6 kg)     Fetal Status: Fetal Heart Rate (bpm): 136 Fundal Height: 37 cm Movement: Present  Presentation: Vertex  General:  Alert, oriented and cooperative. Patient is in no acute distress.  Skin: Skin is warm and dry. No rash noted.   Cardiovascular: Normal heart rate noted  Respiratory: Normal respiratory effort, no problems with respiration noted  Abdomen: Soft, gravid, appropriate for gestational age. Pain/Pressure: Present     Pelvic: Vag. Bleeding: None     Cervical exam performed Dilation: 2 Effacement (%): 50 Station: -3  Extremities: Normal range of motion.  Edema: Trace  Mental Status: Normal mood and affect. Normal behavior. Normal judgment and thought content.   Urinalysis: Urine Protein: Trace Urine Glucose: Negative  Assessment and Plan:  Pregnancy: G2P1001 at [redacted]w[redacted]d  1. Supervision of other normal pregnancy, antepartum Doing well. GBS collected today.  - Culture, beta strep (group b only) - GC/Chlamydia probe amp ([place])not at [facility]  2. Elevated blood pressure reading Per chart review  patient likely with cHTN as elevated BPs seen outside of pregnancy. She states she has known she has high BP since 22yo. Has never taken medication. No PIH symptoms. Will check PIH labs today. BP came down on recheck. Delivery plan to be determined based off labs.  - Protein / creatinine ratio, urine - Comprehensive metabolic panel - CBC  Term labor symptoms and general obstetric precautions including but not limited to vaginal bleeding, contractions, leaking of fluid and fetal movement were reviewed in detail with the patient. Please refer to After Visit Summary for other counseling recommendations.  Return in about 1 week (around 05/25/2017).   Pincus Large, DO

## 2017-05-19 LAB — COMPREHENSIVE METABOLIC PANEL
ALT: 10 IU/L (ref 0–32)
AST: 17 IU/L (ref 0–40)
Albumin/Globulin Ratio: 1.4 (ref 1.2–2.2)
Albumin: 3.5 g/dL (ref 3.5–5.5)
Alkaline Phosphatase: 254 IU/L — ABNORMAL HIGH (ref 39–117)
BUN/Creatinine Ratio: 20 (ref 9–23)
BUN: 11 mg/dL (ref 6–20)
Bilirubin Total: <0.2 mg/dL (ref 0.0–1.2)
CALCIUM: 8.7 mg/dL (ref 8.7–10.2)
CO2: 18 mmol/L — AB (ref 20–29)
CREATININE: 0.55 mg/dL — AB (ref 0.57–1.00)
Chloride: 106 mmol/L (ref 96–106)
GFR calc Af Amer: 153 mL/min/{1.73_m2} (ref >59)
GFR, EST NON AFRICAN AMERICAN: 133 mL/min/{1.73_m2} (ref >59)
GLOBULIN, TOTAL: 2.5 g/dL (ref 1.5–4.5)
GLUCOSE: 85 mg/dL (ref 65–99)
Potassium: 3.8 mmol/L (ref 3.5–5.2)
SODIUM: 138 mmol/L (ref 134–144)
Total Protein: 6 g/dL (ref 6.0–8.5)

## 2017-05-19 LAB — CBC
HEMOGLOBIN: 12.3 g/dL (ref 11.1–15.9)
Hematocrit: 37.6 % (ref 34.0–46.6)
MCH: 29.2 pg (ref 26.6–33.0)
MCHC: 32.7 g/dL (ref 31.5–35.7)
MCV: 89 fL (ref 79–97)
Platelets: 186 10*3/uL (ref 150–379)
RBC: 4.21 x10E6/uL (ref 3.77–5.28)
RDW: 13.7 % (ref 12.3–15.4)
WBC: 8.9 10*3/uL (ref 3.4–10.8)

## 2017-05-19 LAB — PROTEIN / CREATININE RATIO, URINE
Creatinine, Urine: 261.9 mg/dL
PROTEIN UR: 38.2 mg/dL
Protein/Creat Ratio: 146 mg/g creat (ref 0–200)

## 2017-05-19 LAB — GC/CHLAMYDIA PROBE AMP (~~LOC~~) NOT AT ARMC
Chlamydia: NEGATIVE
Neisseria Gonorrhea: NEGATIVE

## 2017-05-21 LAB — CULTURE, BETA STREP (GROUP B ONLY): Strep Gp B Culture: POSITIVE — AB

## 2017-05-22 ENCOUNTER — Inpatient Hospital Stay (HOSPITAL_COMMUNITY): Payer: Medicaid Other | Admitting: Anesthesiology

## 2017-05-22 ENCOUNTER — Inpatient Hospital Stay (HOSPITAL_COMMUNITY)
Admission: AD | Admit: 2017-05-22 | Discharge: 2017-05-24 | DRG: 806 | Disposition: A | Discharge status: Home / Self Care | Payer: Medicaid Other | Source: Ambulatory Visit | Attending: Obstetrics and Gynecology | Admitting: Obstetrics and Gynecology

## 2017-05-22 ENCOUNTER — Encounter (HOSPITAL_COMMUNITY): Payer: Self-pay | Admitting: *Deleted

## 2017-05-22 DIAGNOSIS — F1290 Cannabis use, unspecified, uncomplicated: Secondary | ICD-10-CM | POA: Diagnosis present

## 2017-05-22 DIAGNOSIS — I10 Essential (primary) hypertension: Secondary | ICD-10-CM

## 2017-05-22 DIAGNOSIS — O1002 Pre-existing essential hypertension complicating childbirth: Principal | ICD-10-CM | POA: Diagnosis present

## 2017-05-22 DIAGNOSIS — O99324 Drug use complicating childbirth: Secondary | ICD-10-CM | POA: Diagnosis present

## 2017-05-22 DIAGNOSIS — Z3480 Encounter for supervision of other normal pregnancy, unspecified trimester: Secondary | ICD-10-CM

## 2017-05-22 DIAGNOSIS — Z3483 Encounter for supervision of other normal pregnancy, third trimester: Secondary | ICD-10-CM | POA: Diagnosis present

## 2017-05-22 DIAGNOSIS — F17210 Nicotine dependence, cigarettes, uncomplicated: Secondary | ICD-10-CM | POA: Diagnosis present

## 2017-05-22 DIAGNOSIS — Z3A38 38 weeks gestation of pregnancy: Secondary | ICD-10-CM

## 2017-05-22 DIAGNOSIS — O479 False labor, unspecified: Secondary | ICD-10-CM | POA: Diagnosis present

## 2017-05-22 DIAGNOSIS — O99334 Smoking (tobacco) complicating childbirth: Secondary | ICD-10-CM | POA: Diagnosis present

## 2017-05-22 DIAGNOSIS — O99824 Streptococcus B carrier state complicating childbirth: Secondary | ICD-10-CM | POA: Diagnosis not present

## 2017-05-22 HISTORY — DX: Essential (primary) hypertension: I10

## 2017-05-22 LAB — CBC
HEMATOCRIT: 35.1 % — AB (ref 36.0–46.0)
HEMOGLOBIN: 12.3 g/dL (ref 12.0–15.0)
MCH: 31 pg (ref 26.0–34.0)
MCHC: 35 g/dL (ref 30.0–36.0)
MCV: 88.4 fL (ref 78.0–100.0)
Platelets: 161 10*3/uL (ref 150–400)
RBC: 3.97 MIL/uL (ref 3.87–5.11)
RDW: 13.6 % (ref 11.5–15.5)
WBC: 10.6 10*3/uL — AB (ref 4.0–10.5)

## 2017-05-22 LAB — RAPID URINE DRUG SCREEN, HOSP PERFORMED
Amphetamines: NOT DETECTED
Barbiturates: NOT DETECTED
Benzodiazepines: NOT DETECTED
COCAINE: NOT DETECTED
OPIATES: NOT DETECTED
TETRAHYDROCANNABINOL: POSITIVE — AB

## 2017-05-22 LAB — TYPE AND SCREEN
ABO/RH(D): O POS
ANTIBODY SCREEN: NEGATIVE

## 2017-05-22 LAB — RPR: RPR Ser Ql: NONREACTIVE

## 2017-05-22 MED ORDER — EPHEDRINE 5 MG/ML INJ
10.0000 mg | INTRAVENOUS | Status: DC | PRN
  Filled 2017-05-22: qty 2

## 2017-05-22 MED ORDER — SOD CITRATE-CITRIC ACID 500-334 MG/5ML PO SOLN: 30.0000 mL | ORAL | Status: DC | PRN

## 2017-05-22 MED ORDER — PRENATAL MULTIVITAMIN CH
1.0000 | ORAL_TABLET | Freq: Every day | ORAL | Status: DC
  Administered 2017-05-23 – 2017-05-24 (×2): 1 via ORAL
  Filled 2017-05-22 (×2): qty 1

## 2017-05-22 MED ORDER — ACETAMINOPHEN 325 MG PO TABS
650.0000 mg | ORAL_TABLET | ORAL | Status: DC | PRN
  Administered 2017-05-22 – 2017-05-23 (×2): 650 mg via ORAL
  Filled 2017-05-22 (×2): qty 2

## 2017-05-22 MED ORDER — COCONUT OIL OIL: 1.0000 "application " | TOPICAL_OIL | Status: DC | PRN

## 2017-05-22 MED ORDER — FENTANYL 2.5 MCG/ML BUPIVACAINE 1/10 % EPIDURAL INFUSION (WH - ANES)
14.0000 mL/h | INTRAMUSCULAR | Status: DC | PRN
  Administered 2017-05-22: 14 mL/h via EPIDURAL
  Filled 2017-05-22: qty 100

## 2017-05-22 MED ORDER — IBUPROFEN 600 MG PO TABS
600.0000 mg | ORAL_TABLET | Freq: Four times a day (QID) | ORAL | Status: DC
  Administered 2017-05-22 – 2017-05-24 (×10): 600 mg via ORAL
  Filled 2017-05-22 (×10): qty 1

## 2017-05-22 MED ORDER — PENICILLIN G POT IN DEXTROSE 60000 UNIT/ML IV SOLN
3.0000 10*6.[IU] | INTRAVENOUS | Status: DC
  Filled 2017-05-22 (×2): qty 50

## 2017-05-22 MED ORDER — LIDOCAINE HCL (PF) 1 % IJ SOLN
INTRAMUSCULAR | Status: DC | PRN
  Administered 2017-05-22: 8 mL via EPIDURAL

## 2017-05-22 MED ORDER — ONDANSETRON HCL 4 MG/2ML IJ SOLN: 4.0000 mg | Freq: Four times a day (QID) | INTRAMUSCULAR | Status: DC | PRN

## 2017-05-22 MED ORDER — ONDANSETRON HCL 4 MG/2ML IJ SOLN: 4.0000 mg | INTRAMUSCULAR | Status: DC | PRN

## 2017-05-22 MED ORDER — WITCH HAZEL-GLYCERIN EX PADS: 1.0000 "application " | MEDICATED_PAD | CUTANEOUS | Status: DC | PRN

## 2017-05-22 MED ORDER — LACTATED RINGERS IV SOLN: 500.0000 mL | INTRAVENOUS | Status: DC | PRN

## 2017-05-22 MED ORDER — OXYTOCIN BOLUS FROM INFUSION
500.0000 mL | Freq: Once | INTRAVENOUS | Status: AC
  Administered 2017-05-22: 500 mL via INTRAVENOUS

## 2017-05-22 MED ORDER — ZOLPIDEM TARTRATE 5 MG PO TABS: 5.0000 mg | ORAL_TABLET | Freq: Every evening | ORAL | Status: DC | PRN

## 2017-05-22 MED ORDER — ONDANSETRON HCL 4 MG PO TABS: 4.0000 mg | ORAL_TABLET | ORAL | Status: DC | PRN

## 2017-05-22 MED ORDER — DIPHENHYDRAMINE HCL 25 MG PO CAPS: 25.0000 mg | ORAL_CAPSULE | Freq: Four times a day (QID) | ORAL | Status: DC | PRN

## 2017-05-22 MED ORDER — OXYCODONE-ACETAMINOPHEN 5-325 MG PO TABS: 2.0000 | ORAL_TABLET | ORAL | Status: DC | PRN

## 2017-05-22 MED ORDER — PHENYLEPHRINE 40 MCG/ML (10ML) SYRINGE FOR IV PUSH (FOR BLOOD PRESSURE SUPPORT)
80.0000 ug | PREFILLED_SYRINGE | INTRAVENOUS | Status: DC | PRN
  Filled 2017-05-22: qty 5

## 2017-05-22 MED ORDER — TETANUS-DIPHTH-ACELL PERTUSSIS 5-2.5-18.5 LF-MCG/0.5 IM SUSP: 0.5000 mL | Freq: Once | INTRAMUSCULAR | Status: DC

## 2017-05-22 MED ORDER — SODIUM CHLORIDE 0.9 % IV SOLN
5.0000 10*6.[IU] | Freq: Once | INTRAVENOUS | Status: AC
  Administered 2017-05-22: 5 10*6.[IU] via INTRAVENOUS
  Filled 2017-05-22: qty 5

## 2017-05-22 MED ORDER — SENNOSIDES-DOCUSATE SODIUM 8.6-50 MG PO TABS
2.0000 | ORAL_TABLET | ORAL | Status: DC
  Administered 2017-05-22 – 2017-05-23 (×2): 2 via ORAL
  Filled 2017-05-22 (×2): qty 2

## 2017-05-22 MED ORDER — PHENYLEPHRINE 40 MCG/ML (10ML) SYRINGE FOR IV PUSH (FOR BLOOD PRESSURE SUPPORT)
80.0000 ug | PREFILLED_SYRINGE | INTRAVENOUS | Status: DC | PRN
  Filled 2017-05-22: qty 5
  Filled 2017-05-22: qty 10

## 2017-05-22 MED ORDER — SIMETHICONE 80 MG PO CHEW: 80.0000 mg | CHEWABLE_TABLET | ORAL | Status: DC | PRN

## 2017-05-22 MED ORDER — LACTATED RINGERS IV SOLN
INTRAVENOUS | Status: DC
  Administered 2017-05-22: 04:00:00 via INTRAVENOUS

## 2017-05-22 MED ORDER — BENZOCAINE-MENTHOL 20-0.5 % EX AERO
1.0000 "application " | INHALATION_SPRAY | CUTANEOUS | Status: DC | PRN
  Administered 2017-05-22: 1 via TOPICAL
  Filled 2017-05-22: qty 56

## 2017-05-22 MED ORDER — DIPHENHYDRAMINE HCL 50 MG/ML IJ SOLN: 12.5000 mg | INTRAMUSCULAR | Status: DC | PRN

## 2017-05-22 MED ORDER — ACETAMINOPHEN 325 MG PO TABS: 650.0000 mg | ORAL_TABLET | ORAL | Status: DC | PRN

## 2017-05-22 MED ORDER — LACTATED RINGERS IV SOLN: 500.0000 mL | Freq: Once | INTRAVENOUS | Status: DC

## 2017-05-22 MED ORDER — OXYTOCIN 40 UNITS IN LACTATED RINGERS INFUSION - SIMPLE MED
2.5000 [IU]/h | INTRAVENOUS | Status: DC
  Filled 2017-05-22: qty 1000

## 2017-05-22 MED ORDER — OXYCODONE HCL 5 MG PO TABS: 5.0000 mg | ORAL_TABLET | ORAL | Status: DC | PRN

## 2017-05-22 MED ORDER — FLEET ENEMA 7-19 GM/118ML RE ENEM: 1.0000 | ENEMA | Freq: Every day | RECTAL | Status: DC | PRN

## 2017-05-22 MED ORDER — LIDOCAINE HCL (PF) 1 % IJ SOLN
30.0000 mL | INTRAMUSCULAR | Status: DC | PRN
  Filled 2017-05-22: qty 30

## 2017-05-22 MED ORDER — DIBUCAINE 1 % RE OINT
1.0000 "application " | TOPICAL_OINTMENT | RECTAL | Status: DC | PRN
  Administered 2017-05-22: 1 via RECTAL
  Filled 2017-05-22: qty 28

## 2017-05-22 MED ORDER — OXYCODONE-ACETAMINOPHEN 5-325 MG PO TABS: 1.0000 | ORAL_TABLET | ORAL | Status: DC | PRN

## 2017-05-22 NOTE — Progress Notes (Signed)
To BS via w/c °

## 2017-05-22 NOTE — Anesthesia Postprocedure Evaluation (Signed)
Anesthesia Post Note  Patient: Patricia Warren  Procedure(s) Performed: AN AD HOC LABOR EPIDURAL     Patient location during evaluation: Mother Baby Anesthesia Type: Epidural Level of consciousness: awake and alert Pain management: pain level controlled Vital Signs Assessment: post-procedure vital signs reviewed and stable Respiratory status: spontaneous breathing, nonlabored ventilation and respiratory function stable Cardiovascular status: stable Postop Assessment: no headache, no backache and epidural receding Anesthetic complications: no    Last Vitals:  Vitals:   05/22/17 0530 05/22/17 0600  BP: 136/79 134/74  Pulse: 69 67  Resp:    Temp:      Last Pain:  Vitals:   05/22/17 0500  TempSrc:   PainSc: 3    Pain Goal:                 Nam Vossler

## 2017-05-22 NOTE — Progress Notes (Signed)
Dr Artist Pais on unit. Aware of pt's admission and status. Aware of sl elevated B/Ps and recent labs. Aware of sve, ctxs pattern. Will admit to [facility]

## 2017-05-22 NOTE — Progress Notes (Signed)
Dr Artist Pais in discussing admission with pt

## 2017-05-22 NOTE — Progress Notes (Signed)
CLINICAL SOCIAL WORK MATERNAL/CHILD NOTE  Patient Details  Name: Patricia Warren MRN: [id number] Date of Birth: 05/22/2019  Date:  05/22/2017  Clinical Social Worker Initiating Note:  Madilyn Fireman, MSW, LCSW-A Date/Time: Initiated:  05/22/17/1324     Child's Name:  Patricia Warren   Biological Parents:  Father, Mother   Need for Interpreter:  None   Reason for Referral:  Current Substance Use/Substance Use During Pregnancy    Address:  [address] [address]    Phone number:  [phone] (home)     Additional phone number:  Household Members/Support Persons (HM/SP):   Household Member/Support Person 1   HM/SP Name Relationship DOB or Age  HM/SP -1 Neal McAuley FOB    HM/SP -2        HM/SP -3        HM/SP -4        HM/SP -5        HM/SP -6        HM/SP -7        HM/SP -8          Natural Supports (not living in the home):  Extended Family, Friends, Immediate Family   Professional Supports: None   Employment: Unemployed   Type of Work:  None  Education:  High school graduate   Homebound arranged:  N/A  Museum/gallery curator Resources:  [employer]   Other Resources:  [employer], Physicist, medical    Cultural/Religious Considerations Which May Impact Care:  None  Strengths:  Ability to meet basic needs , Lexicographer chosen, Home prepared for child    Psychotropic Medications:         Pediatrician:    [place] area  Pediatrician List:   [facility]      Pediatrician Fax Number:    Risk Factors/Current Problems:      Cognitive State:  Alert , Able to Concentrate    Mood/Affect:  Calm , Comfortable    CSW Assessment: CSW met with patient, FOB, and infant at bedside. A family friend was present upon CSW arrival, patient asked friend to step outside. Infant's name is Rudy-Mae McAuley. CSW spoke with patient and FOB  Judithe Modest) regarding patient's + UDS for marijuana on 05/22/17. Patient stated that she was expecting CSW to come visit her. Patient stated that she used marijuana with her prior pregnancy and saw a Education officer, museum then as well. Patient reported using marijuana recreationally for years. Patient states that her oldest child is Patricia Warren (DOB: 06/03/2014). CSW informed patient of hospital drug screening policies for infant due to her being positive. CSW explained process and notified patient that if any substance is found to be in the cord or urine of the infant that [place] will make notification to [place], patient stated understanding. CSW asked patient about pediatrician for infant, infant will go to [facility]. Patient reports having a brand new car seat for infant that she knows how to use and install. Patient reports that infant will sleep in pack in play at home, SIDS discussed, patient knowledgeable on topic. Patient stated that she resides with her daughter, her sister Brielynn Sekula (DOB: 5/28/unknown year), and her mother Othell Diluzio. Patient is a high school graduate and is currently unemployed but FOB works at the "recycling center." Patient told CSW that no other substance  would be found other than marijuana in her baby's test results and that she did not have concerns about results. Patient stated that she has had prior CPS involvement in the past due to her marijuana use, but the case was immediately closed. CSW and patient discussed adding infant to [facility] and Medicaid file. Patient did not have further questions for CSW. CSW encouraged patient to reach out if needs or questions arise.   CSW to monitor chart for UDS and cord results.  CSW Plan/Description:  CSW Will Continue to Monitor Umbilical Cord Tissue Drug Screen Results and Make Report if [facility], [place], Perinatal Mood and Anxiety Disorder (PMADs) Education, Sudden Infant Death  Syndrome (SIDS) Education    Archie Endo, LCSWA 05/22/2017, 1:30 PM

## 2017-05-22 NOTE — Anesthesia Procedure Notes (Signed)
Epidural Patient location during procedure: OB Start time: 05/22/2017 4:20 AM End time: 05/22/2017 4:25 AM  Staffing Anesthesiologist: Bethena Midget, MD  Preanesthetic Checklist Completed: patient identified, site marked, surgical consent, pre-op evaluation, timeout performed, IV checked, risks and benefits discussed and monitors and equipment checked  Epidural Patient position: sitting Prep: site prepped and draped and DuraPrep Patient monitoring: continuous pulse ox and blood pressure Approach: midline Location: L4-L5 Injection technique: LOR air  Needle:  Needle type: Tuohy  Needle gauge: 17 G Needle length: 9 cm and 9 Needle insertion depth: 8 cm Catheter type: closed end flexible Catheter size: 19 Gauge Catheter at skin depth: 13 cm Test dose: negative  Assessment Events: blood not aspirated, injection not painful, no injection resistance, negative IV test and no paresthesia

## 2017-05-22 NOTE — H&P (Addendum)
LABOR AND DELIVERY ADMISSION HISTORY AND PHYSICAL NOTE  Patricia Warren is a 22 y.o. female G2P1001 with IUP at [redacted]w[redacted]d by LMP presenting for contractions since yesterday.   She reports positive fetal movement. She denies leakage of fluid or vaginal bleeding. No HA, vision changes, RUQ pain, leg edema.   Prenatal History/Complications:  Elevated BP at last prenatal visit with normal PIH labs. Recalls being told that had hypertension in childhood but was never started on any medications. Positive chlamydia in first trimester with negative TOC.  Past Medical History: Past Medical History:  Diagnosis Date  . Chlamydia infection affecting pregnancy in first trimester, antepartum   . Medical history non-contributory   . Mild obesity   . Smoker   . Trichimoniasis     Past Surgical History: Past Surgical History:  Procedure Laterality Date  . NO PAST SURGERIES    . WISDOM TOOTH EXTRACTION     2012    Obstetrical History: OB History    Gravida  2   Para  1   Term  1   Preterm      AB      Living  1     SAB      TAB      Ectopic      Multiple  0   Live Births  1           Social History: Social History   Socioeconomic History  . Marital status: Single    Spouse name: Not on file  . Number of children: Not on file  . Years of education: Not on file  . Highest education level: Not on file  Occupational History  . Not on file  Social Needs  . Financial resource strain: Not on file  . Food insecurity:    Worry: Not on file    Inability: Not on file  . Transportation needs:    Medical: Not on file    Non-medical: Not on file  Tobacco Use  . Smoking status: Current Every Day Smoker    Packs/day: 0.25    Types: Cigarettes  . Smokeless tobacco: Never Used  Substance and Sexual Activity  . Alcohol use: Not Currently    Alcohol/week: 0.6 oz    Types: 1 Glasses of wine per week    Frequency: Never    Comment: early April 2019  . Drug use: No    Types:  Marijuana    Comment: last use Mid april 2019  . Sexual activity: Yes    Birth control/protection: None  Lifestyle  . Physical activity:    Days per week: Not on file    Minutes per session: Not on file  . Stress: Not on file  Relationships  . Social connections:    Talks on phone: Not on file    Gets together: Not on file    Attends religious service: Not on file    Active member of club or organization: Not on file    Attends meetings of clubs or organizations: Not on file    Relationship status: Not on file  Other Topics Concern  . Not on file  Social History Narrative  . Not on file    Family History: Family History  Problem Relation Age of Onset  . Asthma Brother     Allergies: No Known Allergies  Medications Prior to Admission  Medication Sig Dispense Refill Last Dose  . cyclobenzaprine (FLEXERIL) 5 MG tablet Take 1 tablet (5 mg total) by  mouth 3 (three) times daily as needed for muscle spasms. 15 tablet 0 Past Week at Unknown time  . famotidine (PEPCID) 20 MG tablet Take 1 tablet (20 mg total) by mouth 2 (two) times daily. 60 tablet 3 05/21/2017 at Unknown time  . Prenatal Vit-Fe Fumarate-FA (PRENATAL VITAMINS) 28-0.8 MG TABS Take 28 mg by mouth daily with breakfast. 30 tablet 4 05/21/2017 at Unknown time     Review of Systems   All systems reviewed and negative except as stated in HPI  Blood pressure 140/84, pulse 75, temperature 97.6 F (36.4 C), resp. rate 20, height 5' 8.5" (1.74 m), weight 95.3 kg (210 lb), last menstrual period 09/22/2016. General appearance: alert, cooperative and no distress Lungs: no respiratory distress Heart: regular rate Abdomen: soft, non-tender Extremities: No calf swelling or tenderness Presentation: cephalic by RN cervical exam, sutures felt Fetal monitoring: 130 bpm, moderate variability, + accelerations, no decelerations Uterine activity: q1-34min Dilation: 4 Effacement (%): 80 Station: -1 Exam by:: Quintella Baton rNC     Prenatal labs: ABO, Rh: O/Positive/-- (01/24 0918) Antibody: Negative (01/24 0918) Rubella: Immune RPR: Non Reactive (03/29 1024)  HBsAg: Negative (01/24 0918)  HIV: Non Reactive (03/29 1024)  GBS: Positive (05/14 0000)  Genetic screening:  declined Anatomy US: incomplete, fetal heart and spine suboptimal views secondary to fetal position  Prenatal Transfer Tool  Maternal Diabetes: No Genetic Screening: Declined Maternal Ultrasounds/Referrals: Normal but incomplete Fetal Ultrasounds or other Referrals:  None Maternal Substance Abuse:  Yes:  Type: Marijuana in the past Significant Maternal Medications:  Meds include: Other: famotidine Significant Maternal Lab Results: Lab values include: Group B Strep positive  No results found for this or any previous visit (from the past 24 hour(s)).  Patient Active Problem List   Diagnosis Date Noted  . Family history of bipolar disorder 02/25/2017  . Chlamydia infection affecting pregnancy in second trimester 02/01/2017  . Supervision of other normal pregnancy, antepartum 01/28/2017  . Marijuana use 03/20/2014  . Smoker     Assessment: Patricia Warren is a 22 y.o. G2P1001 at [redacted]w[redacted]d here for SOL  #Labor: expectant management #Pain: Epidural #FWB: Category I #ID:  GBS positive #MOF: bottle #MOC: IUD #Circ: girl, N/A  Patricia Her, DO PGY-2 5/18/20193:35 AM  CNM attestation:  I have seen and examined this patient; I agree with above documentation in the resident's note.   Patricia Warren is a 22 y.o. G2P1001 here for SOL  PE: BP 134/74   Pulse 67   Temp 97.9 F (36.6 C) (Oral)   Resp 20   Ht 5' 8.5" (1.74 m)   Wt 95.3 kg (210 lb)   LMP 09/22/2016   BMI 31.47 kg/m   Resp: normal effort, no distress Abd: gravid  ROS, labs, PMH reviewed  Plan: Admit to [facility] Expectant management Anticipate SVD Watch BPs  Patricia Warren CNM 05/22/2017, 7:01 AM

## 2017-05-22 NOTE — Anesthesia Preprocedure Evaluation (Signed)
Anesthesia Evaluation  Patient identified by MRN, date of birth, ID band Patient awake    Reviewed: Allergy & Precautions, H&P , NPO status , Patient's Chart, lab work & pertinent test results, reviewed documented beta blocker date and time   Airway Mallampati: I  TM Distance: >3 FB Neck ROM: full    Dental no notable dental hx.    Pulmonary neg pulmonary ROS, Current Smoker,    Pulmonary exam normal breath sounds clear to auscultation       Cardiovascular hypertension, Normal cardiovascular exam Rhythm:regular Rate:Normal     Neuro/Psych negative neurological ROS  negative psych ROS   GI/Hepatic negative GI ROS, Neg liver ROS,   Endo/Other  negative endocrine ROS  Renal/GU negative Renal ROS  negative genitourinary   Musculoskeletal   Abdominal   Peds  Hematology negative hematology ROS (+)   Anesthesia Other Findings   Reproductive/Obstetrics (+) Pregnancy                             Anesthesia Physical Anesthesia Plan  ASA: II  Anesthesia Plan: Epidural   Post-op Pain Management:    Induction:   PONV Risk Score and Plan:   Airway Management Planned:   Additional Equipment:   Intra-op Plan:   Post-operative Plan:   Informed Consent: I have reviewed the patients History and Physical, chart, labs and discussed the procedure including the risks, benefits and alternatives for the proposed anesthesia with the patient or authorized representative who has indicated his/her understanding and acceptance.     Plan Discussed with:   Anesthesia Plan Comments:         Anesthesia Quick Evaluation

## 2017-05-23 NOTE — Progress Notes (Signed)
CSW made report to [place] CPS regarding + UDS for marijuana on both patient and newborn. CPS accepted report, will make contact with patient within 72 hours. There are no barriers to discharge.  Roderic Lammert, MSW, LCSW-A Clinical Social Worker [facility] [phone]  

## 2017-05-23 NOTE — Progress Notes (Addendum)
Post Partum Day 2 Subjective: no complaints, up ad lib, voiding and tolerating PO  Objective: Blood pressure 139/83, pulse 73, temperature 98 F (36.7 C), temperature source Oral, resp. rate 16, height 5' 8.5" (1.74 m), weight 210 lb (95.3 kg), last menstrual period 09/22/2016, unknown if currently breastfeeding.  Physical Exam:  General: alert and no distress Lochia: appropriate Uterine Fundus: firm DVT Evaluation: No evidence of DVT seen on physical exam.  Recent Labs    05/22/17 0340  HGB 12.3  HCT 35.1*    Assessment/Plan: Normotensive overnight.  Will need outpt follow up for BP check SW c/s for +THC on UDS, following cord tox, will make report if positive Plan for discharge tomorrow    LOS: 1 day   Garnette Gunner 05/23/2017, 7:29 AM  I assessed this pt and agree with the above assessment

## 2017-05-24 DIAGNOSIS — I10 Essential (primary) hypertension: Secondary | ICD-10-CM | POA: Diagnosis present

## 2017-05-24 MED ORDER — AMLODIPINE BESYLATE 5 MG PO TABS: 5.0000 mg | ORAL_TABLET | Freq: Every day | ORAL | 1 refills | Status: DC

## 2017-05-24 MED ORDER — MENTHOL 3 MG MT LOZG
1.0000 | LOZENGE | OROMUCOSAL | Status: DC | PRN
  Filled 2017-05-24: qty 9

## 2017-05-24 MED ORDER — AMLODIPINE BESYLATE 5 MG PO TABS
5.0000 mg | ORAL_TABLET | Freq: Every day | ORAL | Status: DC
  Administered 2017-05-24: 5 mg via ORAL
  Filled 2017-05-24 (×2): qty 1

## 2017-05-24 MED ORDER — IBUPROFEN 600 MG PO TABS: 600.0000 mg | ORAL_TABLET | Freq: Four times a day (QID) | ORAL | 0 refills | Status: DC

## 2017-05-24 NOTE — Progress Notes (Signed)
CSW received consult due to score of 20 on the [place] Depression Screen.   When CSW arrived, MOB was sitting in the recliner holding baby on her chest.  FOB was sitting on bed.  MOB welcomed CSW into the room and gave permission to speak openly with FOB present.  CSW acknowledged that MOB met with weekend CSW yesterday, but wanted to return to speak to her specifically about how she is feeling emotionally and to discuss her score on the [place] Postnatal Depression Scale.  MOB recalls filling out this screen and told CSW "I'm aggravated and I haven't slept."  She states she hadn't slept well before coming in to deliver because of being pregnant and hasn't slept since being in the hospital.  She adamantly states that she wants to go home and is feeling trapped within the walls of her hospital room.  CSW asked if she understands why she is still here and she reports that her BP is elevated.  CNM arrived at this time and spoke to [facility] about POC.  MOB still seems annoyed that she has to be monitored today and possibly another night.  CSW asked CNM to inform MOB about potential negative effects of unmonitored high BP.  She informed MOB of risk of "seizure, stroke or death."  After CNM left, CSW processed this with MOB to ensure she understood the need to stay until she is safe for discharge.  She said, "I didn't know I could die."  CSW spoke to [facility] about the need to rest and offered for baby to go to the nursery for a couple hours this afternoon and put a note on MOB's door requesting only necessary visitors.  MOB states she will consider this after her next BP check at noon.   CSW spoke at length with MOB about the importance of addressing emotional concerns.  MOB was tearful while we spoke, which CSW feels is in part due to her ongoing hospitalization, however, CSW feels she is at high risk for experiencing PMADs.  She states she has been referred to the [facility] and [facility].  CSW  informed her that she does not need to go to both agencies, as they can provide the same services.  CSW explained that [facility] has in-home counseling available through their Healthy Start program and asked if MOB is open to CSW making a referral.  She agreed.  CSW discussed MOB's postpartum experience after her first baby and MOB reports that she was "wound tight," and made a crazy sign with her hand around her head.  She reports that she did not tell anyone that she was struggling with her emotions at the time.  She states she was prescribed Zoloft during the pregnancy, but states that it made her feel worse.  CSW asked how long she took the medication.  She thinks "2-3 days."  CSW explained that this was definitely not long enough for the medication to provide any benefit and most likely she was still feeling poorly because of depression.  CSW suggests that she consider restarting medication (a different antidepressant if she prefers-MOB is not breast feeding) and discussing this with her OB provider prior to discharge, since it does take a few weeks to reach a therapeutic level in the blood stream.  CSW explained that she can follow up with a NP at [facility] for medication management.  MOB agrees.  (CSW spoke to RN about this  and about helping MOB get some rest today).  MOB denies SI "at the moment."  CSW spoke very seriously about the difference between ideation vs plan/intent.  CSW asked MOB to commit to calling 911 or going to the ER if she has SI with plan/intent at any time as this is a crisis that needs to be addressed immediately without shame or guilt.  MOB agreed.   CSW provided education regarding Baby Blues vs PMADs and encouraged MOB to evaluate her mental health throughout the postpartum period with the use of the New Mom Checklist developed by Postpartum Progress as well as the [place] Postnatal Depression Scale and notify a medical professional if  symptoms arise.  MOB will participate in the Healthy Steps program through [facility] of the [place].  She understands there is a waiting list.  If she feels she needs intervention sooner, she has information from [facility] regarding walk-in clinic hours.

## 2017-05-24 NOTE — Progress Notes (Signed)
MD in middle of delivery at this time.  L&D RN relayed message of increase BP. MD states will check pt later.

## 2017-05-24 NOTE — Progress Notes (Signed)
Post Partum Day 2 Subjective: no complaints, up ad lib, voiding and tolerating PO  Objective: Blood pressure (!) 144/93, pulse 75, temperature 98.1 F (36.7 C), temperature source Oral, resp. rate 18, height 5' 8.5" (1.74 m), weight 210 lb (95.3 kg), last menstrual period 09/22/2016, SpO2 97 %, unknown if currently breastfeeding.  Physical Exam:  General: alert, cooperative and no distress Lochia: appropriate Uterine Fundus: firm DVT Evaluation: No evidence of DVT seen on physical exam. Negative Homan's sign. No cords or calf tenderness. No significant calf/ankle edema.  Recent Labs    05/22/17 0340  HGB 12.3  HCT 35.1*    Assessment/Plan: Social Work consult Chronic HTN-BP more elevated since yesterday, will start Norvasc, consider discharge home later if not severe range   LOS: 2 days   Donette Larry, CNM 05/24/2017, 11:03 AM

## 2017-05-24 NOTE — Discharge Summary (Signed)
OB Discharge Summary     Patient Name: Patricia Warren DOB: 1995/01/31 MRN: [id number]  Date of admission: 05/22/2017 Delivering MD: Leland Her   Date of discharge: 05/24/2017  Admitting diagnosis: 38 WKS CTX EVERY 3 TO 4 MIN Intrauterine pregnancy: [redacted]w[redacted]d     Secondary diagnosis:  Active Problems:   Uterine contractions during pregnancy   Chronic hypertension   Spontaneous vaginal delivery  Additional problems: possible chronic HTN, marijuana use, Chlamydia infection, tobacco use     Discharge diagnosis: Term Pregnancy Delivered and CHTN                                                                                                Post partum procedures: started on Norvasc  on PPD #2 d/t moderate BP elevations, no severe range BPs on day of discharge  Augmentation: none  Complications: None  Hospital course:  Onset of Labor With Vaginal Delivery     22 y.o. yo [id number] at [redacted]w[redacted]d was admitted in Active Labor on 05/22/2017. Patient had an uncomplicated labor course as follows:  Membrane Rupture Time/Date: 6:32 AM ,05/22/2017   Intrapartum Procedures: Episiotomy: None [1]                                         Lacerations:  None [1]  Patient had a delivery of a Viable infant. 05/22/2017  Information for the patient's newborn:  Ivalee, Strauser Girl [place] [[id number]]  Delivery Method: Vag-Spont    Pateint had an uncomplicated postpartum course.  She is ambulating, tolerating a regular diet, passing flatus, and urinating well. Patient is discharged home in stable condition on 05/24/17.   Physical exam  Vitals:   05/24/17 0720 05/24/17 0810 05/24/17 1200 05/24/17 1636  BP: (!) 146/92 (!) 144/93 137/75 (!) 142/75  Pulse: 69 75 63 69  Resp:   18   Temp:      TempSrc:      SpO2:      Weight:      Height:       General: alert, cooperative and no distress Lochia: appropriate Uterine Fundus: firm DVT Evaluation: No evidence of DVT seen on physical exam. Negative Homan's sign. No  cords or calf tenderness. No significant calf/ankle edema. Labs: Lab Results  Component Value Date   WBC 10.6 (H) 05/22/2017   HGB 12.3 05/22/2017   HCT 35.1 (L) 05/22/2017   MCV 88.4 05/22/2017   PLT 161 05/22/2017   CMP Latest Ref Rng & Units 05/18/2017  Glucose 65 - 99 mg/dL 85  BUN 6 - 20 mg/dL 11  Creatinine 6.96 - 2.95 mg/dL 2.84(X)  Sodium 324 - 401 mmol/L 138  Potassium 3.5 - 5.2 mmol/L 3.8  Chloride 96 - 106 mmol/L 106  CO2 20 - 29 mmol/L 18(L)  Calcium 8.7 - 10.2 mg/dL 8.7  Total Protein 6.0 - 8.5 g/dL 6.0  Total Bilirubin 0.0 - 1.2 mg/dL <0.2  Alkaline Phos 39 - 117 IU/L 254(H)  AST 0 - 40 IU/L 17  ALT 0 - 32 IU/L 10    Discharge instruction: per After Visit Summary and "Baby and Me Booklet".  After visit meds:  Allergies as of 05/24/2017   No Known Allergies     Medication List    STOP taking these medications   acetaminophen 500 MG tablet Commonly known as:  TYLENOL   cyclobenzaprine 5 MG tablet Commonly known as:  FLEXERIL   famotidine 20 MG tablet Commonly known as:  PEPCID     TAKE these medications   amLODipine 5 MG tablet Commonly known as:  NORVASC Take 1 tablet (5 mg total) by mouth daily. Start taking on:  05/25/2017   ibuprofen 600 MG tablet Commonly known as:  ADVIL,MOTRIN Take 1 tablet (600 mg total) by mouth every 6 (six) hours.   Prenatal Vitamins 28-0.8 MG Tabs Take 28 mg by mouth daily with breakfast.       Diet: routine diet  Activity: Advance as tolerated. Pelvic rest for 6 weeks.   Outpatient follow up:WOC- 2 days for BP check and 6 weeks Follow up Appt: Future Appointments  Date Time Provider Department Center  06/02/2017 10:15 AM [facility] NURSE [facility]  06/28/2017  9:55 AM Armando Reichert, CNM [facility]   Follow up Visit:No follow-ups on file.  Postpartum contraception: IUD Mirena  Newborn Data: Live born female  Birth Weight: 6 lb 8.8 oz (2971 g) APGAR: 9, 9  Newborn Delivery   Birth date/time:   05/22/2017 06:32:00 Delivery type:  Vaginal, Spontaneous     Baby Feeding: Bottle Disposition:home with mother   05/24/2017 Donette Larry, CNM

## 2017-05-24 NOTE — Progress Notes (Signed)
Melanie Bhambri CNm Notified of BP of 144/93. States will be by to see Pt.

## 2017-05-24 NOTE — Progress Notes (Signed)
Patient c/o of stuffiness from allergies and scratchy throat, looking for relief.  Physician notified (Dr Despina Hidden).  New orders cepacol lozenges

## 2017-05-25 ENCOUNTER — Encounter: Payer: Self-pay | Admitting: Obstetrics and Gynecology

## 2017-06-02 ENCOUNTER — Ambulatory Visit: Payer: Self-pay

## 2017-06-28 ENCOUNTER — Ambulatory Visit (INDEPENDENT_AMBULATORY_CARE_PROVIDER_SITE_OTHER): Payer: Medicaid Other | Admitting: Advanced Practice Midwife

## 2017-06-28 ENCOUNTER — Encounter: Payer: Self-pay | Admitting: Advanced Practice Midwife

## 2017-06-28 DIAGNOSIS — Z1389 Encounter for screening for other disorder: Secondary | ICD-10-CM

## 2017-06-28 DIAGNOSIS — Z3009 Encounter for other general counseling and advice on contraception: Secondary | ICD-10-CM

## 2017-06-28 MED ORDER — HYDROCHLOROTHIAZIDE 25 MG PO TABS: 25.0000 mg | ORAL_TABLET | Freq: Every day | ORAL | 1 refills | Status: DC

## 2017-06-28 NOTE — Progress Notes (Signed)
Post Partum Exam  Patricia Warren is a 22 y.o. 492P2002 female who presents for a postpartum visit. She is 5 week postpartum following a spontaneous vaginal delivery. I have fully reviewed the prenatal and intrapartum course. The delivery was at 37 gestational weeks.  Anesthesia: epidural. Postpartum course has been uncomplicated. Baby's course has been uncomplicated. Baby is feeding by bottle Rush Barer- Gerber gentlestart/gentle soothe. Bleeding no bleeding. Bowel function is normal. Bladder function is normal. Patient is sexually active. Contraception method is none. Postpartum depression screening:neg  The following portions of the patient's history were reviewed and updated as appropriate: allergies, current medications, past family history, past medical history, past social history, past surgical history and problem list. Last pap smear done 01/2017 and was Normal  Patient states that she did not start the norvasc after leaving the hospital.  Review of Systems Pertinent items are noted in HPI.    BP 143/85 Pulse 62 Wt 192lb  Objective:  Last menstrual period 09/22/2016, unknown if currently breastfeeding.  General:  alert and cooperative   Breasts:  inspection negative, no nipple discharge or bleeding, no masses or nodularity palpable  Lungs: clear to auscultation bilaterally  Heart:  regular rate and rhythm, S1, S2 normal, no murmur, click, rub or gallop  Abdomen: soft, non-tender; bowel sounds normal; no masses,  no organomegaly   Vulva:  not evaluated  Vagina: not evaluated  Cervix:  not examined   Corpus: not examined  Adnexa:  not evaluated  Rectal Exam: Not performed.        Assessment:    Routine postpartum exam. Pap smear not done at today's visit.   Plan:   1. Contraception: unsure planning Liletta v Paragard 2. CHTN- HCTZ 25mg  #30 with 1 RF, and patient knows she needs to FU with PCP for BP management.  3. Follow up in: ASAP for IUD of choice   or as needed.  4. Counseled  about IUD options. Patient is still undecided today. Will all for appt ASAP for IUD insertion

## 2017-07-13 ENCOUNTER — Other Ambulatory Visit: Payer: Self-pay | Admitting: Student

## 2017-08-29 ENCOUNTER — Encounter (HOSPITAL_COMMUNITY): Payer: Self-pay

## 2017-08-29 ENCOUNTER — Ambulatory Visit (HOSPITAL_COMMUNITY)
Admission: EM | Admit: 2017-08-29 | Discharge: 2017-08-29 | Disposition: A | Discharge status: Home / Self Care | Payer: Self-pay | Attending: Internal Medicine | Admitting: Internal Medicine

## 2017-08-29 ENCOUNTER — Other Ambulatory Visit: Payer: Self-pay

## 2017-08-29 DIAGNOSIS — Z711 Person with feared health complaint in whom no diagnosis is made: Secondary | ICD-10-CM

## 2017-08-29 DIAGNOSIS — N760 Acute vaginitis: Secondary | ICD-10-CM | POA: Insufficient documentation

## 2017-08-29 DIAGNOSIS — Z6828 Body mass index (BMI) 28.0-28.9, adult: Secondary | ICD-10-CM | POA: Insufficient documentation

## 2017-08-29 DIAGNOSIS — E669 Obesity, unspecified: Secondary | ICD-10-CM | POA: Insufficient documentation

## 2017-08-29 DIAGNOSIS — F17210 Nicotine dependence, cigarettes, uncomplicated: Secondary | ICD-10-CM | POA: Insufficient documentation

## 2017-08-29 DIAGNOSIS — I10 Essential (primary) hypertension: Secondary | ICD-10-CM | POA: Insufficient documentation

## 2017-08-29 MED ORDER — AZITHROMYCIN 250 MG PO TABS
ORAL_TABLET | ORAL | Status: AC
  Filled 2017-08-29: qty 4

## 2017-08-29 MED ORDER — CEFTRIAXONE SODIUM 250 MG IJ SOLR
INTRAMUSCULAR | Status: AC
  Filled 2017-08-29: qty 250

## 2017-08-29 MED ORDER — LIDOCAINE HCL (PF) 1 % IJ SOLN
INTRAMUSCULAR | Status: AC
  Filled 2017-08-29: qty 2

## 2017-08-29 MED ORDER — AZITHROMYCIN 250 MG PO TABS
1000.0000 mg | ORAL_TABLET | Freq: Once | ORAL | Status: AC
  Administered 2017-08-29: 1000 mg via ORAL

## 2017-08-29 MED ORDER — CEFTRIAXONE SODIUM 250 MG IJ SOLR
250.0000 mg | Freq: Once | INTRAMUSCULAR | Status: AC
  Administered 2017-08-29: 250 mg via INTRAMUSCULAR

## 2017-08-29 NOTE — ED Triage Notes (Signed)
Vaginal discharge  Yellowish in color x 1 week.

## 2017-08-29 NOTE — Discharge Instructions (Signed)
We have treated you today for gonorrhea and chlamydia.  Will notify you of any positive findings from your vaginal sample and if any changes to treatment are needed.   Please withhold from intercourse for the next week. Please use condoms to prevent STD's.   If symptoms worsen or do not improve in the next week to return to be seen or to follow up with your PCP.

## 2017-08-29 NOTE — ED Provider Notes (Signed)
[facility]    CSN: [id number] Arrival date & time: 08/29/17  1416     History   Chief Complaint Chief Complaint  Patient presents with  . Vaginal Discharge    HPI Patricia Warren is a 22 y.o. female.   Patricia Warren presents with complaints of vaginal discharge with odor which has been ongoing for the past 1-2 weeks. Yellow in color. States similar to chlamydia she has had in the past. No itching. No urinary symptoms. No vaginal bleeding. No back pain or abdominal pain. LMP 8/2 and has been regular. Gave birth three months ago. Sexually active and does not use condoms, states she is concerned about potential exposure with her partner. Hx of chlamydia, htn, trichomonas.    ROS per HPI.      Past Medical History:  Diagnosis Date  . Chlamydia infection affecting pregnancy in first trimester, antepartum   . Hypertension   . Medical history non-contributory   . Mild obesity   . Smoker   . Trichimoniasis     Patient Active Problem List   Diagnosis Date Noted  . Chronic hypertension 05/24/2017  . Spontaneous vaginal delivery 05/24/2017  . Uterine contractions during pregnancy 05/22/2017  . Family history of bipolar disorder 02/25/2017  . Chlamydia infection affecting pregnancy in second trimester 02/01/2017  . Supervision of other normal pregnancy, antepartum 01/28/2017  . Marijuana use 03/20/2014  . Smoker     Past Surgical History:  Procedure Laterality Date  . NO PAST SURGERIES    . WISDOM TOOTH EXTRACTION     2012    OB History    Gravida  2   Para  2   Term  2   Preterm      AB      Living  2     SAB      TAB      Ectopic      Multiple  0   Live Births  2            Home Medications    Prior to Admission medications   Medication Sig Start Date End Date Taking? Authorizing Provider  hydrochlorothiazide (HYDRODIURIL) 25 MG tablet Take 1 tablet (25 mg total) by mouth daily. 06/28/17   Armando Reichert, CNM  ibuprofen  (ADVIL,MOTRIN) 600 MG tablet Take 1 tablet (600 mg total) by mouth every 6 (six) hours. 05/24/17   Donette Larry, CNM  Prenatal Vit-Fe Fumarate-FA (PRENATAL VITAMINS) 28-0.8 MG TABS Take 28 mg by mouth daily with breakfast. Patient not taking: Reported on 08/29/2017 03/14/14   Joanna Puff, MD    Family History Family History  Problem Relation Age of Onset  . Asthma Brother     Social History Social History   Tobacco Use  . Smoking status: Current Every Day Smoker    Packs/day: 0.25    Types: Cigarettes  . Smokeless tobacco: Never Used  Substance Use Topics  . Alcohol use: Not Currently    Alcohol/week: 1.0 standard drinks    Types: 1 Glasses of wine per week    Frequency: Never    Comment: early April 2019  . Drug use: No    Types: Marijuana    Comment: last use Mid april 2019     Allergies   Patient has no known allergies.   Review of Systems Review of Systems   Physical Exam Triage Vital Signs ED Triage Vitals [08/29/17 1427]  Enc Vitals Group     BP  Pulse      Resp      Temp      Temp src      SpO2      Weight 187 lb (84.8 kg)     Height      Head Circumference      Peak Flow      Pain Score      Pain Loc      Pain Edu?      Excl. in GC?    No data found.  Updated Vital Signs Wt 187 lb (84.8 kg)   LMP 08/06/2017   BMI 28.02 kg/m    Physical Exam  Constitutional: She is oriented to person, place, and time. She appears well-developed and well-nourished. No distress.  Cardiovascular: Normal rate, regular rhythm and normal heart sounds.  Pulmonary/Chest: Effort normal and breath sounds normal.  Abdominal: Soft. There is no tenderness. There is no rigidity, no rebound, no guarding and no CVA tenderness.  Denies sores, lesions, vaginal bleeding; no pelvic pain; gu exam deferred at this time, vaginal self swab collected.    Neurological: She is alert and oriented to person, place, and time.  Skin: Skin is warm and dry.     UC  Treatments / Results  Labs (all labs ordered are listed, but only abnormal results are displayed) Labs Reviewed  CERVICOVAGINAL ANCILLARY ONLY    EKG None  Radiology No results found.  Procedures Procedures (including critical care time)  Medications Ordered in UC Medications  azithromycin (ZITHROMAX) tablet 1,000 mg (has no administration in time range)  cefTRIAXone (ROCEPHIN) injection 250 mg (has no administration in time range)    Initial Impression / Assessment and Plan / UC Course  I have reviewed the triage vital signs and the nursing notes.  Pertinent labs & imaging results that were available during my care of the patient were reviewed by me and considered in my medical decision making (see chart for details).     Empiric treatment with azithromycin and rocephin provided today. Vaginal cytology pending. Will notify of any positive findings and if any changes to treatment are needed.  Encouraged safe sex practices. If symptoms worsen or do not improve in the next week to return to be seen or to follow up with PCP.  Patient verbalized understanding and agreeable to plan.   Final Clinical Impressions(s) / UC Diagnoses   Final diagnoses:  Acute vaginitis  Concern about STD in female without diagnosis     Discharge Instructions     We have treated you today for gonorrhea and chlamydia.  Will notify you of any positive findings from your vaginal sample and if any changes to treatment are needed.   Please withhold from intercourse for the next week. Please use condoms to prevent STD's.   If symptoms worsen or do not improve in the next week to return to be seen or to follow up with your PCP.     ED Prescriptions    None     Controlled Substance Prescriptions [place] Controlled Substance Registry consulted? Not Applicable   Georgetta HaberBurky, Brieann Osinski B, NP 08/29/17 [phone]

## 2017-08-29 NOTE — ED Notes (Signed)
Specimen-vaginal swab- is labeled and in minilab

## 2017-08-30 ENCOUNTER — Telehealth (HOSPITAL_COMMUNITY): Payer: Self-pay

## 2017-08-30 LAB — CERVICOVAGINAL ANCILLARY ONLY
Bacterial vaginitis: POSITIVE — AB
CANDIDA VAGINITIS: NEGATIVE
CHLAMYDIA, DNA PROBE: NEGATIVE
Neisseria Gonorrhea: NEGATIVE
TRICH (WINDOWPATH): NEGATIVE

## 2017-08-30 MED ORDER — METRONIDAZOLE 500 MG PO TABS: 500.0000 mg | ORAL_TABLET | Freq: Two times a day (BID) | ORAL | 0 refills | Status: DC

## 2017-08-30 NOTE — Telephone Encounter (Signed)
Bacterial vaginosis is positive. This was not treated at the urgent care visit.  Flagyl 500 mg BID x 7 days #14 no refills sent to patients pharmacy of choice.  Attempted to reach patient. No answer at this time. 

## 2018-01-05 NOTE — L&D Delivery Note (Signed)
OB/[facility]N [facility] Delivery Note  Patricia Warren is a 23 y.o. G3P3003 s/p NSVD at [redacted]w[redacted]d. She was admitted for IOL for cHTN..   ROM: 4h 22m with clear fluid GBS Status: --/Negative (10/12 1121) Maximum Maternal Temperature: 98.3 F    Labor Progress: . Patient arrived at 1 cm dilation and was induced with cytotec x1, pitocin, and AROM.   Delivery Date/Time: 10/19/2018 at 0903 Delivery: Called to room and patient was complete and pushing. Head delivered in ROA position. No nuchal cord present. Shoulder and body delivered in usual fashion. Infant with spontaneous cry, placed on mother's abdomen, dried and stimulated. Cord clamped x 2 after 1-minute delay, and cut by father. Cord blood drawn. Placenta delivered spontaneously with gentle cord traction. Fundus firm with massage and Pitocin. Labia, perineum, vagina, and cervix inspected with no lacerations.   Placenta: 3v, intact Complications: none Lacerations: none EBL: 25cc Analgesia: epidural   Infant: APGAR (1 MIN): 8   APGAR (5 MINS): 9    Weight: 2905 grams  Augustin Coupe, MD/[facility] OB/GYN Fellow, Faculty Practice

## 2018-06-29 ENCOUNTER — Ambulatory Visit: Payer: Self-pay

## 2018-07-05 ENCOUNTER — Telehealth: Payer: Self-pay | Admitting: Advanced Practice Midwife

## 2018-07-05 NOTE — Telephone Encounter (Signed)
The patient called in to rescheduled the missed appointment. Informed the patient of wearing a face mask, no children or visitors are allowed due to the [place] restrictions, please sanitize hands upon entering the office. Also educated of the new location. The patient verbalized understanding.

## 2018-07-11 ENCOUNTER — Telehealth: Payer: Self-pay | Admitting: Family Medicine

## 2018-07-11 NOTE — Telephone Encounter (Signed)
Attempted to call patient about her appointment on 7/7 @ 9:20. No answer, left a voicemail instructing patient to wear a face mask for the entire appointment and no visitors can attend with her. Patient instructed if she has any symptoms not to attend the appointment. Instead give the office a call back to be rescheduled. Symptoms and office number left.

## 2018-07-12 ENCOUNTER — Ambulatory Visit: Payer: Self-pay

## 2018-07-18 ENCOUNTER — Encounter: Payer: Self-pay | Admitting: Family Medicine

## 2018-07-18 ENCOUNTER — Ambulatory Visit (INDEPENDENT_AMBULATORY_CARE_PROVIDER_SITE_OTHER): Payer: Medicaid Other | Admitting: General Practice

## 2018-07-18 ENCOUNTER — Other Ambulatory Visit: Payer: Self-pay

## 2018-07-18 VITALS — BP 130/77

## 2018-07-18 DIAGNOSIS — Z3201 Encounter for pregnancy test, result positive: Secondary | ICD-10-CM | POA: Diagnosis present

## 2018-07-18 DIAGNOSIS — Z3491 Encounter for supervision of normal pregnancy, unspecified, first trimester: Secondary | ICD-10-CM

## 2018-07-18 LAB — POCT PREGNANCY, URINE: Preg Test, Ur: POSITIVE — AB

## 2018-07-18 NOTE — Progress Notes (Signed)
Patient presents to office today for UPT. UPT +. Patient reports first positive home test a couple weeks ago. Reports a light period the beginning of June that only lasted 2-3 days with just spotting- the period before that was around 5/4. Will schedule ultrasound for dating. Scheduled 7/27 @ 8am. Patient reports taking HCTZ and another medication for her BP (maybe Norvasc) though very intermittently, sometimes usually only once a month. BP is 130/77 today. Told patient to discontinue using medications until new OB appt. Recommended she begin taking a PNV and start OB care. Patient verbalized understanding & had no questions.  Koren Bound RN BSN 07/18/18

## 2018-07-28 NOTE — Progress Notes (Signed)
Patient ID: Patricia Warren, female   DOB: September 28, 1995, 23 y.o.   MRN: [id number] Patient seen and assessed by nursing staff during this encounter. I have reviewed the chart and agree with the documentation and plan.  Emeterio Reeve, MD 07/28/2018 11:15 AM

## 2018-07-29 ENCOUNTER — Telehealth: Payer: Self-pay | Admitting: Family Medicine

## 2018-07-29 NOTE — Telephone Encounter (Signed)
Spoke to patient about her appointment on 7/27 @ 8:30. Patient instructed to wear a face mask for the entire appointment and no visitors are allowed with her during the visit. Patient screened for covid symptoms and denied having any

## 2018-08-01 ENCOUNTER — Ambulatory Visit (HOSPITAL_COMMUNITY): Admission: RE | Admit: 2018-08-01 | Payer: Self-pay | Source: Ambulatory Visit

## 2018-08-01 ENCOUNTER — Ambulatory Visit: Payer: Self-pay

## 2018-08-18 ENCOUNTER — Telehealth: Payer: Self-pay | Admitting: Family Medicine

## 2018-08-18 NOTE — Telephone Encounter (Signed)
Attempted to call patient about her appointment change. Left a detailed message on her voicemail.

## 2018-08-23 ENCOUNTER — Telehealth: Payer: Self-pay

## 2018-08-24 ENCOUNTER — Other Ambulatory Visit: Payer: Self-pay

## 2018-08-24 ENCOUNTER — Ambulatory Visit (HOSPITAL_BASED_OUTPATIENT_CLINIC_OR_DEPARTMENT_OTHER)
Admission: RE | Admit: 2018-08-24 | Discharge: 2018-08-24 | Disposition: A | Discharge status: Home / Self Care | Payer: Medicaid Other | Source: Ambulatory Visit | Attending: Obstetrics & Gynecology | Admitting: Obstetrics & Gynecology

## 2018-08-24 ENCOUNTER — Ambulatory Visit (INDEPENDENT_AMBULATORY_CARE_PROVIDER_SITE_OTHER): Payer: Self-pay

## 2018-08-24 ENCOUNTER — Encounter (HOSPITAL_COMMUNITY): Payer: Self-pay

## 2018-08-24 ENCOUNTER — Ambulatory Visit (HOSPITAL_COMMUNITY)
Admission: RE | Admit: 2018-08-24 | Discharge: 2018-08-24 | Disposition: A | Discharge status: Home / Self Care | Payer: Medicaid Other | Source: Ambulatory Visit | Attending: Obstetrics & Gynecology | Admitting: Obstetrics & Gynecology

## 2018-08-24 ENCOUNTER — Other Ambulatory Visit: Payer: Self-pay | Admitting: Obstetrics & Gynecology

## 2018-08-24 DIAGNOSIS — Z3687 Encounter for antenatal screening for uncertain dates: Secondary | ICD-10-CM | POA: Diagnosis not present

## 2018-08-24 DIAGNOSIS — Z3A29 29 weeks gestation of pregnancy: Secondary | ICD-10-CM

## 2018-08-24 DIAGNOSIS — O10013 Pre-existing essential hypertension complicating pregnancy, third trimester: Secondary | ICD-10-CM | POA: Diagnosis not present

## 2018-08-24 DIAGNOSIS — Z363 Encounter for antenatal screening for malformations: Secondary | ICD-10-CM | POA: Diagnosis not present

## 2018-08-24 DIAGNOSIS — Z3491 Encounter for supervision of normal pregnancy, unspecified, first trimester: Secondary | ICD-10-CM

## 2018-08-24 DIAGNOSIS — Z712 Person consulting for explanation of examination or test findings: Secondary | ICD-10-CM

## 2018-08-24 NOTE — Progress Notes (Signed)
Pt here today for OB US results.  I apologized to the pt that the appt was not needed.  Pt informed that her anatomy US was normal and that she will f/u at her visit with the provider on 09/08/18.  Pt verbalized understanding.    Mel Almond, RN 08/24/18

## 2018-08-25 ENCOUNTER — Other Ambulatory Visit (HOSPITAL_COMMUNITY): Payer: Self-pay | Admitting: *Deleted

## 2018-08-25 DIAGNOSIS — O0933 Supervision of pregnancy with insufficient antenatal care, third trimester: Secondary | ICD-10-CM

## 2018-08-25 NOTE — Progress Notes (Signed)
Patient seen and assessed by nursing staff during this encounter. I have reviewed the chart and agree with the documentation and plan.  Mora Bellman, MD 08/25/2018 6:12 PM

## 2018-09-05 ENCOUNTER — Encounter (HOSPITAL_COMMUNITY): Payer: Self-pay | Admitting: *Deleted

## 2018-09-07 ENCOUNTER — Telehealth: Payer: Self-pay | Admitting: Family Medicine

## 2018-09-07 NOTE — Telephone Encounter (Signed)
Attempted to call patient about her appointment on 9/3 @ 9:30. No answer, and could not leave a message because the number stated that the call could not be completed

## 2018-09-08 ENCOUNTER — Encounter (HOSPITAL_COMMUNITY): Payer: Self-pay

## 2018-09-08 ENCOUNTER — Other Ambulatory Visit: Payer: Self-pay

## 2018-09-08 ENCOUNTER — Ambulatory Visit: Payer: PRIVATE HEALTH INSURANCE | Admitting: *Deleted

## 2018-09-08 ENCOUNTER — Ambulatory Visit (HOSPITAL_COMMUNITY)
Admission: RE | Admit: 2018-09-08 | Discharge: 2018-09-08 | Disposition: A | Discharge status: Home / Self Care | Payer: Medicaid Other | Source: Ambulatory Visit | Attending: Obstetrics and Gynecology | Admitting: Obstetrics and Gynecology

## 2018-09-08 ENCOUNTER — Other Ambulatory Visit (HOSPITAL_COMMUNITY): Payer: Self-pay | Admitting: Maternal & Fetal Medicine

## 2018-09-08 ENCOUNTER — Ambulatory Visit (HOSPITAL_COMMUNITY): Payer: Medicaid Other | Admitting: *Deleted

## 2018-09-08 DIAGNOSIS — O0933 Supervision of pregnancy with insufficient antenatal care, third trimester: Secondary | ICD-10-CM | POA: Diagnosis not present

## 2018-09-08 DIAGNOSIS — Z362 Encounter for other antenatal screening follow-up: Secondary | ICD-10-CM

## 2018-09-08 DIAGNOSIS — O0990 Supervision of high risk pregnancy, unspecified, unspecified trimester: Secondary | ICD-10-CM

## 2018-09-08 DIAGNOSIS — O10013 Pre-existing essential hypertension complicating pregnancy, third trimester: Secondary | ICD-10-CM

## 2018-09-08 DIAGNOSIS — Z3A31 31 weeks gestation of pregnancy: Secondary | ICD-10-CM

## 2018-09-08 NOTE — Progress Notes (Signed)
7616 I called [place] for her visit. I heard a message " I'm sorry , your call cannot be completed at this time- please try your call later." Gurpreet Mikhail,RN   I called [place] for her visit. I heard a message " I'm sorry , your call cannot be completed at this time- please try your call later." I called her contact number and a female answered and  I asked her to please give As[place] the message we are calling about her appointment- please call us to reschedule.  Cailee Blanke,RN

## 2018-09-09 ENCOUNTER — Other Ambulatory Visit (HOSPITAL_COMMUNITY): Payer: Self-pay | Admitting: *Deleted

## 2018-09-09 DIAGNOSIS — O10913 Unspecified pre-existing hypertension complicating pregnancy, third trimester: Secondary | ICD-10-CM

## 2018-09-13 ENCOUNTER — Telehealth: Payer: Self-pay | Admitting: Student

## 2018-09-13 NOTE — Telephone Encounter (Signed)
Called the patient to inform of the upcoming visit and informed of our new location. Completed the covid19 screening for the patient, the patient answered no to all questions. Also informed of no children or visitors due to the covid19 restriction. Also informed of wearing a face mask upon entry. The patient verbalized understanding. °

## 2018-09-14 ENCOUNTER — Other Ambulatory Visit: Payer: Self-pay

## 2018-09-14 ENCOUNTER — Ambulatory Visit (INDEPENDENT_AMBULATORY_CARE_PROVIDER_SITE_OTHER): Payer: Medicaid Other | Admitting: Family Medicine

## 2018-09-14 VITALS — BP 135/85 | HR 101 | Wt 205.8 lb

## 2018-09-14 DIAGNOSIS — O0993 Supervision of high risk pregnancy, unspecified, third trimester: Secondary | ICD-10-CM

## 2018-09-14 DIAGNOSIS — O10013 Pre-existing essential hypertension complicating pregnancy, third trimester: Secondary | ICD-10-CM

## 2018-09-14 DIAGNOSIS — Z113 Encounter for screening for infections with a predominantly sexual mode of transmission: Secondary | ICD-10-CM | POA: Diagnosis not present

## 2018-09-14 DIAGNOSIS — Z3A32 32 weeks gestation of pregnancy: Secondary | ICD-10-CM | POA: Diagnosis not present

## 2018-09-14 DIAGNOSIS — O0990 Supervision of high risk pregnancy, unspecified, unspecified trimester: Secondary | ICD-10-CM

## 2018-09-14 DIAGNOSIS — I10 Essential (primary) hypertension: Secondary | ICD-10-CM

## 2018-09-14 MED ORDER — ASPIRIN EC 81 MG PO TBEC: 81.0000 mg | DELAYED_RELEASE_TABLET | Freq: Every day | ORAL | 2 refills | Status: DC

## 2018-09-14 NOTE — Progress Notes (Signed)
Desires tubal

## 2018-09-14 NOTE — Addendum Note (Signed)
Addended by: Alric Seton on: 09/14/2018 01:07 PM   Modules accepted: Orders

## 2018-09-14 NOTE — Progress Notes (Signed)
Subjective:  Patricia Warren is a G3P2002 5142w1d by 29 week US being seen today for her first obstetrical visit.  Her obstetrical history is significant for Chronic hypertension. Unintended pregnancy.  Patient does not intend to breast feed. Pregnancy history fully reviewed.  Patient reports no complaints.  BP 135/85   Pulse (!) 101   Wt 205 lb 12.8 oz (93.4 kg)   LMP  (LMP Unknown)   BMI 30.84 kg/m   HISTORY: OB History  Gravida Para Term Preterm AB Living  3 2 2     2   SAB TAB Ectopic Multiple Live Births        0 2    # Outcome Date GA Lbr Len/2nd Weight Sex Delivery Anes PTL Lv  3 Current           2 Term 05/22/17 6381w2d 09:00 / 00:32 6 lb 8.8 oz (2.971 kg) F Vag-Spont EPI  LIV  1 Term 06/03/14 10782w1d 13:21 / 00:26 5 lb 6.8 oz (2.461 kg) F Vag-Spont EPI  LIV    Past Medical History:  Diagnosis Date  . Chlamydia infection affecting pregnancy in first trimester, antepartum   . Hypertension   . Medical history non-contributory   . Mild obesity   . Smoker   . Trichimoniasis     Past Surgical History:  Procedure Laterality Date  . NO PAST SURGERIES    . WISDOM TOOTH EXTRACTION     2012    Family History  Problem Relation Age of Onset  . Asthma Brother      Exam  BP 135/85   Pulse (!) 101   Wt 205 lb 12.8 oz (93.4 kg)   LMP  (LMP Unknown)   BMI 30.84 kg/m   CONSTITUTIONAL: Well-developed, well-nourished female in no acute distress.  HENT:  Normocephalic, atraumatic, External right and left ear normal. Oropharynx is clear and moist EYES: Conjunctivae and EOM are normal. Pupils are equal, round, and reactive to light. No scleral icterus.  NECK: Normal range of motion, supple, no masses.  Normal thyroid.  CARDIOVASCULAR: Normal heart rate noted, regular rhythm RESPIRATORY: Clear to auscultation bilaterally. Effort and breath sounds normal, no problems with respiration noted. ABDOMEN: Soft, normal bowel sounds, no distention noted.  No tenderness, rebound or  guarding.  PELVIC: not done MUSCULOSKELETAL: Normal range of motion. No tenderness.  No cyanosis, clubbing, or edema.  2+ distal pulses. SKIN: Skin is warm and dry. No rash noted. Not diaphoretic. No erythema. No pallor. NEUROLOGIC: Alert and oriented to person, place, and time. Normal reflexes, muscle tone coordination. No cranial nerve deficit noted. PSYCHIATRIC: Normal mood and affect. Normal behavior. Normal judgment and thought content.    Assessment:    Pregnancy: [id number] Patient Active Problem List   Diagnosis Date Noted  . Supervision of high risk pregnancy, antepartum 09/08/2018  . Chronic hypertension 05/24/2017  . Spontaneous vaginal delivery 05/24/2017  . Uterine contractions during pregnancy 05/22/2017  . Family history of bipolar disorder 02/25/2017  . Chlamydia infection affecting pregnancy in second trimester 02/01/2017  . Supervision of other normal pregnancy, antepartum 01/28/2017  . Marijuana use 03/20/2014  . Smoker       Plan:   1. Supervision of high risk pregnancy, antepartum FHT and FH normal. Will get 2hr GTT and OB panel tomorrow. Patient wants BTL - will need medicaid prior. - Obstetric Panel, Including HIV - Culture, OB Urine  2. Chronic hypertension BP normal - intermittent HCTZ, but hasn't taken for past 2 days. Will have  patient stop HCTZ Start ASA 81mg  CMP, CBC     Problem list reviewed and updated. 75% of 30 min visit spent on counseling and coordination of care.     Truett Mainland 09/14/2018

## 2018-09-15 LAB — PROTEIN / CREATININE RATIO, URINE
Creatinine, Urine: 238.9 mg/dL
Protein, Ur: 18.1 mg/dL
Protein/Creat Ratio: 76 mg/g creat (ref 0–200)

## 2018-09-15 LAB — GC/CHLAMYDIA PROBE AMP (~~LOC~~) NOT AT ARMC
Chlamydia: NEGATIVE
Neisseria Gonorrhea: NEGATIVE

## 2018-09-16 ENCOUNTER — Telehealth: Payer: Self-pay | Admitting: Obstetrics & Gynecology

## 2018-09-16 ENCOUNTER — Ambulatory Visit (HOSPITAL_COMMUNITY): Payer: PRIVATE HEALTH INSURANCE

## 2018-09-16 ENCOUNTER — Encounter (HOSPITAL_COMMUNITY): Payer: Self-pay

## 2018-09-16 ENCOUNTER — Other Ambulatory Visit: Payer: PRIVATE HEALTH INSURANCE

## 2018-09-16 LAB — CULTURE, OB URINE

## 2018-09-16 LAB — URINE CULTURE, OB REFLEX

## 2018-09-16 NOTE — Telephone Encounter (Signed)
Attempted to reach patient about her missed appointment. Was not able to leave a message.

## 2018-09-17 LAB — OBSTETRIC PANEL, INCLUDING HIV
Antibody Screen: NEGATIVE
Basophils Absolute: 0 10*3/uL (ref 0.0–0.2)
Basos: 0 %
EOS (ABSOLUTE): 0.1 10*3/uL (ref 0.0–0.4)
Eos: 1 %
HIV Screen 4th Generation wRfx: NONREACTIVE
Hematocrit: 35.8 % (ref 34.0–46.6)
Hemoglobin: 12.5 g/dL (ref 11.1–15.9)
Hepatitis B Surface Ag: NEGATIVE
Immature Grans (Abs): 0.1 10*3/uL (ref 0.0–0.1)
Immature Granulocytes: 1 %
Lymphocytes Absolute: 1.6 10*3/uL (ref 0.7–3.1)
Lymphs: 18 %
MCH: 30.9 pg (ref 26.6–33.0)
MCHC: 34.9 g/dL (ref 31.5–35.7)
MCV: 89 fL (ref 79–97)
Monocytes Absolute: 0.7 10*3/uL (ref 0.1–0.9)
Monocytes: 8 %
Neutrophils Absolute: 6.7 10*3/uL (ref 1.4–7.0)
Neutrophils: 72 %
Platelets: 178 10*3/uL (ref 150–450)
RBC: 4.04 x10E6/uL (ref 3.77–5.28)
RDW: 12.9 % (ref 11.7–15.4)
RPR Ser Ql: NONREACTIVE
Rh Factor: POSITIVE
Rubella Antibodies, IGG: 2.95 index (ref >0.99)
WBC: 9.2 10*3/uL (ref 3.4–10.8)

## 2018-09-17 LAB — COMPREHENSIVE METABOLIC PANEL
ALT: 7 IU/L (ref 0–32)
AST: 9 IU/L (ref 0–40)
Albumin/Globulin Ratio: 1.5 (ref 1.2–2.2)
Albumin: 3.8 g/dL — ABNORMAL LOW (ref 3.9–5.0)
Alkaline Phosphatase: 93 IU/L (ref 39–117)
BUN/Creatinine Ratio: 13 (ref 9–23)
BUN: 6 mg/dL (ref 6–20)
Bilirubin Total: 0.2 mg/dL (ref 0.0–1.2)
CO2: 18 mmol/L — ABNORMAL LOW (ref 20–29)
Calcium: 8.5 mg/dL — ABNORMAL LOW (ref 8.7–10.2)
Chloride: 105 mmol/L (ref 96–106)
Creatinine, Ser: 0.46 mg/dL — ABNORMAL LOW (ref 0.57–1.00)
GFR calc Af Amer: 161 mL/min/{1.73_m2} (ref >59)
GFR calc non Af Amer: 140 mL/min/{1.73_m2} (ref >59)
Globulin, Total: 2.5 g/dL (ref 1.5–4.5)
Glucose: 74 mg/dL (ref 65–99)
Potassium: 3.8 mmol/L (ref 3.5–5.2)
Sodium: 136 mmol/L (ref 134–144)
Total Protein: 6.3 g/dL (ref 6.0–8.5)

## 2018-09-19 ENCOUNTER — Other Ambulatory Visit: Payer: Self-pay

## 2018-09-19 ENCOUNTER — Other Ambulatory Visit: Payer: PRIVATE HEALTH INSURANCE

## 2018-09-19 ENCOUNTER — Other Ambulatory Visit: Payer: Self-pay | Admitting: *Deleted

## 2018-09-19 DIAGNOSIS — Z3480 Encounter for supervision of other normal pregnancy, unspecified trimester: Secondary | ICD-10-CM

## 2018-09-20 LAB — GLUCOSE TOLERANCE, 2 HOURS W/ 1HR
Glucose, 1 hour: 98 mg/dL (ref 65–179)
Glucose, 2 hour: 88 mg/dL (ref 65–152)
Glucose, Fasting: 79 mg/dL (ref 65–91)

## 2018-09-22 ENCOUNTER — Ambulatory Visit (HOSPITAL_COMMUNITY)
Admission: RE | Admit: 2018-09-22 | Discharge: 2018-09-22 | Disposition: A | Discharge status: Home / Self Care | Payer: PRIVATE HEALTH INSURANCE | Source: Ambulatory Visit | Attending: Obstetrics and Gynecology | Admitting: Obstetrics and Gynecology

## 2018-09-22 ENCOUNTER — Ambulatory Visit (HOSPITAL_COMMUNITY): Payer: PRIVATE HEALTH INSURANCE

## 2018-09-28 ENCOUNTER — Encounter: Payer: Self-pay | Admitting: Obstetrics & Gynecology

## 2018-09-30 ENCOUNTER — Ambulatory Visit (HOSPITAL_COMMUNITY)
Admission: RE | Admit: 2018-09-30 | Discharge: 2018-09-30 | Disposition: A | Discharge status: Home / Self Care | Payer: Medicaid Other | Source: Ambulatory Visit | Attending: Obstetrics and Gynecology | Admitting: Obstetrics and Gynecology

## 2018-09-30 ENCOUNTER — Encounter (HOSPITAL_COMMUNITY): Payer: Self-pay

## 2018-09-30 ENCOUNTER — Ambulatory Visit (HOSPITAL_COMMUNITY): Payer: Medicaid Other | Admitting: *Deleted

## 2018-09-30 ENCOUNTER — Other Ambulatory Visit: Payer: Self-pay

## 2018-09-30 DIAGNOSIS — Z3A34 34 weeks gestation of pregnancy: Secondary | ICD-10-CM

## 2018-09-30 DIAGNOSIS — O10013 Pre-existing essential hypertension complicating pregnancy, third trimester: Secondary | ICD-10-CM | POA: Diagnosis not present

## 2018-09-30 DIAGNOSIS — O0990 Supervision of high risk pregnancy, unspecified, unspecified trimester: Secondary | ICD-10-CM | POA: Insufficient documentation

## 2018-09-30 DIAGNOSIS — O10913 Unspecified pre-existing hypertension complicating pregnancy, third trimester: Secondary | ICD-10-CM

## 2018-10-03 ENCOUNTER — Encounter: Payer: PRIVATE HEALTH INSURANCE | Admitting: Obstetrics and Gynecology

## 2018-10-03 ENCOUNTER — Encounter: Payer: Self-pay | Admitting: Obstetrics and Gynecology

## 2018-10-03 DIAGNOSIS — O0930 Supervision of pregnancy with insufficient antenatal care, unspecified trimester: Secondary | ICD-10-CM | POA: Insufficient documentation

## 2018-10-03 DIAGNOSIS — O09893 Supervision of other high risk pregnancies, third trimester: Secondary | ICD-10-CM | POA: Insufficient documentation

## 2018-10-06 ENCOUNTER — Ambulatory Visit (HOSPITAL_COMMUNITY): Payer: PRIVATE HEALTH INSURANCE

## 2018-10-10 ENCOUNTER — Inpatient Hospital Stay (HOSPITAL_COMMUNITY)
Admission: AD | Admit: 2018-10-10 | Discharge: 2018-10-10 | Discharge status: Against Medical Advice | Payer: Medicaid Other | Attending: Obstetrics and Gynecology | Admitting: Obstetrics and Gynecology

## 2018-10-10 ENCOUNTER — Other Ambulatory Visit: Payer: Self-pay

## 2018-10-10 ENCOUNTER — Encounter (HOSPITAL_COMMUNITY): Payer: Self-pay | Admitting: *Deleted

## 2018-10-10 ENCOUNTER — Telehealth: Payer: Self-pay | Admitting: Family Medicine

## 2018-10-10 DIAGNOSIS — O99333 Smoking (tobacco) complicating pregnancy, third trimester: Secondary | ICD-10-CM | POA: Diagnosis not present

## 2018-10-10 DIAGNOSIS — Z3689 Encounter for other specified antenatal screening: Secondary | ICD-10-CM

## 2018-10-10 DIAGNOSIS — Z3A35 35 weeks gestation of pregnancy: Secondary | ICD-10-CM | POA: Insufficient documentation

## 2018-10-10 DIAGNOSIS — O10913 Unspecified pre-existing hypertension complicating pregnancy, third trimester: Secondary | ICD-10-CM | POA: Diagnosis not present

## 2018-10-10 DIAGNOSIS — O219 Vomiting of pregnancy, unspecified: Secondary | ICD-10-CM | POA: Diagnosis not present

## 2018-10-10 DIAGNOSIS — O26893 Other specified pregnancy related conditions, third trimester: Secondary | ICD-10-CM | POA: Diagnosis not present

## 2018-10-10 DIAGNOSIS — R55 Syncope and collapse: Secondary | ICD-10-CM | POA: Insufficient documentation

## 2018-10-10 DIAGNOSIS — O212 Late vomiting of pregnancy: Secondary | ICD-10-CM | POA: Diagnosis not present

## 2018-10-10 DIAGNOSIS — O10013 Pre-existing essential hypertension complicating pregnancy, third trimester: Secondary | ICD-10-CM | POA: Diagnosis not present

## 2018-10-10 DIAGNOSIS — F17210 Nicotine dependence, cigarettes, uncomplicated: Secondary | ICD-10-CM | POA: Diagnosis not present

## 2018-10-10 DIAGNOSIS — O0993 Supervision of high risk pregnancy, unspecified, third trimester: Secondary | ICD-10-CM

## 2018-10-10 DIAGNOSIS — R519 Headache, unspecified: Secondary | ICD-10-CM | POA: Diagnosis not present

## 2018-10-10 DIAGNOSIS — Z20828 Contact with and (suspected) exposure to other viral communicable diseases: Secondary | ICD-10-CM | POA: Diagnosis not present

## 2018-10-10 DIAGNOSIS — O0990 Supervision of high risk pregnancy, unspecified, unspecified trimester: Secondary | ICD-10-CM

## 2018-10-10 LAB — CBC WITH DIFFERENTIAL/PLATELET
Abs Immature Granulocytes: 0.08 10*3/uL — ABNORMAL HIGH (ref 0.00–0.07)
Basophils Absolute: 0 10*3/uL (ref 0.0–0.1)
Basophils Relative: 0 %
Eosinophils Absolute: 0.1 10*3/uL (ref 0.0–0.5)
Eosinophils Relative: 1 %
HCT: 36.2 % (ref 36.0–46.0)
Hemoglobin: 12.3 g/dL (ref 12.0–15.0)
Immature Granulocytes: 1 %
Lymphocytes Relative: 16 %
Lymphs Abs: 1.5 10*3/uL (ref 0.7–4.0)
MCH: 30.5 pg (ref 26.0–34.0)
MCHC: 34 g/dL (ref 30.0–36.0)
MCV: 89.8 fL (ref 80.0–100.0)
Monocytes Absolute: 0.6 10*3/uL (ref 0.1–1.0)
Monocytes Relative: 6 %
Neutro Abs: 7.4 10*3/uL (ref 1.7–7.7)
Neutrophils Relative %: 76 %
Platelets: 222 10*3/uL (ref 150–400)
RBC: 4.03 MIL/uL (ref 3.87–5.11)
RDW: 13.1 % (ref 11.5–15.5)
WBC: 9.7 10*3/uL (ref 4.0–10.5)
nRBC: 0 % (ref 0.0–0.2)

## 2018-10-10 LAB — RAPID URINE DRUG SCREEN, HOSP PERFORMED
Amphetamines: NOT DETECTED
Barbiturates: NOT DETECTED
Benzodiazepines: NOT DETECTED
Cocaine: NOT DETECTED
Opiates: NOT DETECTED
Tetrahydrocannabinol: POSITIVE — AB

## 2018-10-10 LAB — COMPREHENSIVE METABOLIC PANEL
ALT: 13 U/L (ref 0–44)
AST: 20 U/L (ref 15–41)
Albumin: 2.7 g/dL — ABNORMAL LOW (ref 3.5–5.0)
Alkaline Phosphatase: 115 U/L (ref 38–126)
Anion gap: 10 (ref 5–15)
BUN: 6 mg/dL (ref 6–20)
CO2: 20 mmol/L — ABNORMAL LOW (ref 22–32)
Calcium: 8 mg/dL — ABNORMAL LOW (ref 8.9–10.3)
Chloride: 107 mmol/L (ref 98–111)
Creatinine, Ser: 0.45 mg/dL (ref 0.44–1.00)
GFR calc Af Amer: >60 mL/min (ref >60)
GFR calc non Af Amer: >60 mL/min (ref >60)
Glucose, Bld: 105 mg/dL — ABNORMAL HIGH (ref 70–99)
Potassium: 3.4 mmol/L — ABNORMAL LOW (ref 3.5–5.1)
Sodium: 137 mmol/L (ref 135–145)
Total Bilirubin: 0.3 mg/dL (ref 0.3–1.2)
Total Protein: 5.7 g/dL — ABNORMAL LOW (ref 6.5–8.1)

## 2018-10-10 LAB — URINALYSIS, ROUTINE W REFLEX MICROSCOPIC
Bilirubin Urine: NEGATIVE
Glucose, UA: NEGATIVE mg/dL
Hgb urine dipstick: NEGATIVE
Ketones, ur: 5 mg/dL — AB
Nitrite: NEGATIVE
Protein, ur: NEGATIVE mg/dL
Specific Gravity, Urine: 1.017 (ref 1.005–1.030)
pH: 8 (ref 5.0–8.0)

## 2018-10-10 LAB — SARS CORONAVIRUS 2 (TAT 6-24 HRS): SARS Coronavirus 2: NEGATIVE

## 2018-10-10 LAB — PROTEIN / CREATININE RATIO, URINE
Creatinine, Urine: 114.4 mg/dL
Protein Creatinine Ratio: 0.1 mg/mg{Cre} (ref 0.00–0.15)
Total Protein, Urine: 12 mg/dL

## 2018-10-10 MED ORDER — LACTATED RINGERS IV BOLUS
1000.0000 mL | Freq: Once | INTRAVENOUS | Status: AC
  Administered 2018-10-10: 1000 mL via INTRAVENOUS

## 2018-10-10 MED ORDER — LABETALOL HCL 5 MG/ML IV SOLN: 40.0000 mg | INTRAVENOUS | Status: DC | PRN

## 2018-10-10 MED ORDER — HYDRALAZINE HCL 20 MG/ML IJ SOLN: 10.0000 mg | INTRAMUSCULAR | Status: DC | PRN

## 2018-10-10 MED ORDER — LABETALOL HCL 5 MG/ML IV SOLN: 80.0000 mg | INTRAVENOUS | Status: DC | PRN

## 2018-10-10 MED ORDER — LABETALOL HCL 5 MG/ML IV SOLN
20.0000 mg | INTRAVENOUS | Status: DC | PRN
  Administered 2018-10-10: 09:00:00 20 mg via INTRAVENOUS
  Filled 2018-10-10: qty 4

## 2018-10-10 NOTE — Progress Notes (Signed)
Not seen today- in ED

## 2018-10-10 NOTE — Progress Notes (Signed)
   Asked to review EKG on Patricia Warren for syncopal event. The EKG (scanned in the media tab) shows sinus rhythm with normal intervals and axes, no ischemic changes, early repolarization changes noted, but likely normal given age. No worrisome findings.  Pixie Casino, MD, [facility], [place] Director of the [facility]  [facility] Diplomate of the American Board of Clinical Lipidology Attending Cardiologist  Direct Dial: [phone]  Fax: [phone]  Website:  [email or web address]

## 2018-10-10 NOTE — MAU Note (Signed)
Covid swab collected tolerated well.Asymptomatic

## 2018-10-10 NOTE — MAU Provider Note (Signed)
History     CSN: [id number]  Arrival date and time: 10/10/18 0810   First Provider Initiated Contact with Patient 10/10/18 [phone]      Chief Complaint  Patient presents with  . Hypertension  . Loss of Consciousness   Ms. Patricia Warren is a 23 y.o. G3P2002 at [redacted]w[redacted]d who presents to MAU for preeclampsia evaluation after she threw up multiple times and then passed out. Pt reports her sister brought her to MAU and the syncopal episode occurred about 10-15 minutes prior to her arrival in MAU. Pt reports the last thing she remembers is throwing up in the sink of the bathroom, and then she woke up on the floor "with a bunch of people around me."  Pt reports HA that started this morning around 2-3AM. Pt denies history of HA issues. Pt denies any treatment for HA at home. 7/10. Pt reports last HA this pregnancy was a week ago.  Pt reports N/V started last night with nausea and she woke up again at 2-3AM with vomiting and has vomited at least 4 times since then. Pt reports she last had something to eat and drink last night.  Pt denies blurry vision/seeing spots, epigastric pain, swelling in face and hands, sudden weight gain. Pt denies chest pain and SOB.  Pt denies constipation, diarrhea, or urinary problems. Pt denies fever, chills, fatigue, sweating or changes in appetite. Pt denies dizziness, light-headedness, weakness.  Pt denies VB, ctx, LOF and reports good FM.  Current pregnancy problems? cHTN Blood Type? O Positive Allergies? NKDA Current medications? Nothing daily, last took BP medication two days ago (pt reports is taking HCTZ  daily), is not taking bASA Current PNC & next appt? [facility], next appt today    OB History    Gravida  3   Para  2   Term  2   Preterm      AB      Living  2     SAB      TAB      Ectopic      Multiple  0   Live Births  2           Past Medical History:  Diagnosis Date  . Chlamydia infection affecting pregnancy in first  trimester, antepartum   . Hypertension   . Mild obesity   . Smoker   . Trichimoniasis     Past Surgical History:  Procedure Laterality Date  . WISDOM TOOTH EXTRACTION     2012    Family History  Problem Relation Age of Onset  . Asthma Brother     Social History   Tobacco Use  . Smoking status: Current Every Day Smoker    Packs/day: 0.25    Types: Cigarettes  . Smokeless tobacco: Never Used  Substance Use Topics  . Alcohol use: Not Currently    Alcohol/week: 1.0 standard drinks    Types: 1 Glasses of wine per week    Frequency: Never    Comment: early April 2019  . Drug use: No    Types: Marijuana    Comment: last used two weeks ago     Allergies: No Known Allergies  No medications prior to admission.    Review of Systems  Constitutional: Negative for chills, diaphoresis, fatigue and fever.  Eyes: Negative for visual disturbance.  Respiratory: Negative for shortness of breath.   Cardiovascular: Negative for chest pain.  Gastrointestinal: Positive for nausea and vomiting. Negative for abdominal pain, constipation and diarrhea.  Genitourinary: Negative for dysuria, flank pain, frequency, pelvic pain, urgency, vaginal bleeding and vaginal discharge.  Neurological: Positive for headaches. Negative for dizziness, weakness and light-headedness.   Physical Exam   Blood pressure 131/78, pulse 71, temperature 97.6 F (36.4 C), temperature source Oral, resp. rate 18, SpO2 100 %, unknown if currently breastfeeding.  Patient Vitals for the past 24 hrs:  BP Temp Temp src Pulse Resp SpO2  10/10/18 1246 131/78 - - 71 - -  10/10/18 1230 (!) 149/81 - - 76 - -  10/10/18 1216 129/77 - - 75 - -  10/10/18 1200 128/79 - - 73 - 100 %  10/10/18 1146 139/63 - - 76 - -  10/10/18 1131 137/73 - - 87 - -  10/10/18 1115 (!) 142/68 - - 80 - 99 %  10/10/18 1101 138/79 - - 89 - -  10/10/18 1046 131/69 - - 72 - -  10/10/18 1030 139/79 - - 78 - 99 %  10/10/18 1016 138/74 - - 73 - -   10/10/18 1001 128/76 - - 76 - -  10/10/18 1000 130/71 - - 75 - 99 %  10/10/18 0956 135/67 - - 80 - -  10/10/18 0950 134/67 - - 82 - 97 %  10/10/18 0946 140/72 - - 75 - -  10/10/18 0941 136/68 - - 71 - -  10/10/18 0935 (!) 144/72 - - 89 - 99 %  10/10/18 0931 (!) 102/30 - - 90 - -  10/10/18 0926 136/72 - - 76 - -  10/10/18 0921 122/75 - - 83 - -  10/10/18 0916 127/90 - - 76 - -  10/10/18 0911 (!) 109/91 - - 76 - -  10/10/18 0906 128/73 - - 74 - -  10/10/18 0901 (!) 107/92 - - 68 - -  10/10/18 0856 129/79 - - 77 - -  10/10/18 0851 134/73 - - 77 - -  10/10/18 0846 (!) 158/83 - - 77 - -  10/10/18 0836 (!) 142/119 - - 76 - -  10/10/18 0830 (!) 150/75 - - 68 - 99 %  10/10/18 0825 (!) 152/84 - - 77 - 98 %  10/10/18 0820 (!) 156/131 - - 78 - 99 %  10/10/18 0815 (!) 144/106 - - 75 - 98 %  10/10/18 0814 (!) 144/104 97.6 F (36.4 C) Oral - 18 -   Physical Exam  Constitutional: She is oriented to person, place, and time. She appears well-developed and well-nourished. No distress.  HENT:  Head: Normocephalic and atraumatic.  Respiratory: Effort normal.  GI: Soft. She exhibits no distension and no mass. There is no abdominal tenderness. There is no rebound and no guarding.  Neurological: She is alert and oriented to person, place, and time.  Skin: Skin is warm and dry. She is not diaphoretic.  Psychiatric: She has a normal mood and affect. Her behavior is normal. Judgment and thought content normal.   Results for orders placed or performed during the hospital encounter of 10/10/18 (from the past 24 hour(s))  Comprehensive metabolic panel     Status: Abnormal   Collection Time: 10/10/18  8:54 AM  Result Value Ref Range   Sodium 137 135 - 145 mmol/L   Potassium 3.4 (L) 3.5 - 5.1 mmol/L   Chloride 107 98 - 111 mmol/L   CO2 20 (L) 22 - 32 mmol/L   Glucose, Bld 105 (H) 70 - 99 mg/dL   BUN 6 6 - 20 mg/dL   Creatinine, Ser 1.610.45 0.44 -  1.00 mg/dL   Calcium 8.0 (L) 8.9 - 10.3 mg/dL   Total  Protein 5.7 (L) 6.5 - 8.1 g/dL   Albumin 2.7 (L) 3.5 - 5.0 g/dL   AST 20 15 - 41 U/L   ALT 13 0 - 44 U/L   Alkaline Phosphatase 115 38 - 126 U/L   Total Bilirubin 0.3 0.3 - 1.2 mg/dL   GFR calc non Af Amer >60 >60 mL/min   GFR calc Af Amer >60 >60 mL/min   Anion gap 10 5 - 15  CBC with Differential/Platelet     Status: Abnormal   Collection Time: 10/10/18  8:54 AM  Result Value Ref Range   WBC 9.7 4.0 - 10.5 K/uL   RBC 4.03 3.87 - 5.11 MIL/uL   Hemoglobin 12.3 12.0 - 15.0 g/dL   HCT 47.8 29.5 - 62.1 %   MCV 89.8 80.0 - 100.0 fL   MCH 30.5 26.0 - 34.0 pg   MCHC 34.0 30.0 - 36.0 g/dL   RDW 30.8 65.7 - 84.6 %   Platelets 222 150 - 400 K/uL   nRBC 0.0 0.0 - 0.2 %   Neutrophils Relative % 76 %   Neutro Abs 7.4 1.7 - 7.7 K/uL   Lymphocytes Relative 16 %   Lymphs Abs 1.5 0.7 - 4.0 K/uL   Monocytes Relative 6 %   Monocytes Absolute 0.6 0.1 - 1.0 K/uL   Eosinophils Relative 1 %   Eosinophils Absolute 0.1 0.0 - 0.5 K/uL   Basophils Relative 0 %   Basophils Absolute 0.0 0.0 - 0.1 K/uL   Immature Granulocytes 1 %   Abs Immature Granulocytes 0.08 (H) 0.00 - 0.07 K/uL  Urinalysis, Routine w reflex microscopic     Status: Abnormal   Collection Time: 10/10/18  9:41 AM  Result Value Ref Range   Color, Urine YELLOW YELLOW   APPearance HAZY (A) CLEAR   Specific Gravity, Urine 1.017 1.005 - 1.030   pH 8.0 5.0 - 8.0   Glucose, UA NEGATIVE NEGATIVE mg/dL   Hgb urine dipstick NEGATIVE NEGATIVE   Bilirubin Urine NEGATIVE NEGATIVE   Ketones, ur 5 (A) NEGATIVE mg/dL   Protein, ur NEGATIVE NEGATIVE mg/dL   Nitrite NEGATIVE NEGATIVE   Leukocytes,Ua SMALL (A) NEGATIVE   RBC / HPF 0-5 0 - 5 RBC/hpf   WBC, UA 0-5 0 - 5 WBC/hpf   Bacteria, UA RARE (A) NONE SEEN   Squamous Epithelial / LPF 6-10 0 - 5   Mucus PRESENT   Protein / creatinine ratio, urine     Status: None   Collection Time: 10/10/18  9:41 AM  Result Value Ref Range   Creatinine, Urine 114.40 mg/dL   Total Protein, Urine 12 mg/dL    Protein Creatinine Ratio 0.10 0.00 - 0.15 mg/mg[Cre]  Urine rapid drug screen (hosp performed)     Status: Abnormal   Collection Time: 10/10/18  9:41 AM  Result Value Ref Range   Opiates NONE DETECTED NONE DETECTED   Cocaine NONE DETECTED NONE DETECTED   Benzodiazepines NONE DETECTED NONE DETECTED   Amphetamines NONE DETECTED NONE DETECTED   Tetrahydrocannabinol POSITIVE (A) NONE DETECTED   Barbiturates NONE DETECTED NONE DETECTED  SARS CORONAVIRUS 2 (TAT 6-24 HRS) Nasopharyngeal Nasopharyngeal Swab     Status: None   Collection Time: 10/10/18 12:22 PM   Specimen: Nasopharyngeal Swab  Result Value Ref Range   SARS Coronavirus 2 NEGATIVE NEGATIVE   [place] Mfm Fetal Bpp Wo Non Stress  Result Date: 09/30/2018 ----------------------------------------------------------------------  OBSTETRICS REPORT                       (Signed Final 09/30/2018 01:44 pm) ---------------------------------------------------------------------- Patient Info  ID #:       [id number]                          D.O.B.:  1995-02-03 (22 yrs)  Name:       Irwin Brakeman                   Visit Date: 09/30/2018 01:03 pm ---------------------------------------------------------------------- Performed By  Performed By:     Eden Lathe BS      Ref. Address:     [address]                                                             [place], [place]                                                             [address]  Attending:        Ma Rings MD         [facility]  Referred By:      Adam Phenix                    MD ---------------------------------------------------------------------- Orders   #  Description                          Code         Ordered By   1  [place] MFM FETAL BPP WO NON              71245.80     [facility]      STRESS   ----------------------------------------------------------------------   #  Order #                    Accession #                 Episode #   1  [id number]  [id number]                  [id number]  ---------------------------------------------------------------------- Indications   Hypertension - Chronic/Pre-existing (HCTZ)     O10.019   Encounter for other antenatal screening        Z36.2   follow-up   [redacted] weeks gestation of pregnancy                Z3A.34  ---------------------------------------------------------------------- Fetal Evaluation  Num Of Fetuses:         1  Fetal Heart Rate(bpm):  155  Cardiac Activity:       Observed  Presentation:           Cephalic  Amniotic Fluid  AFI FV:      Within normal limits  AFI Sum(cm)     %Tile       Largest Pocket(cm)  15.77           57          4.49  RUQ(cm)       RLQ(cm)       LUQ(cm)        LLQ(cm)  2.97          3.93          4.38           4.49 ---------------------------------------------------------------------- Biophysical Evaluation  Amniotic F.V:   Within normal limits       F. Tone:        Observed  F. Movement:    Observed                   Score:          8/8  F. Breathing:   Observed ---------------------------------------------------------------------- Gestational Age  LMP:           20w 4d        Date:  05/09/18                 EDD:   02/13/19  Best:          34w 3d     Det. By:  U/S  (08/24/18)          EDD:   11/08/18 ---------------------------------------------------------------------- Comments  This patient was seen for a biophysical profile due to a history  of chronic hypertension currently treated with  hydrochlorothiazide.  She denies any other problems in her  current pregnancy.  A biophysical profile performed today was 8 out of 8.  There  was normal amniotic fluid noted.  She will return in 1 week for another biophysical profile. ----------------------------------------------------------------------                   Johnell Comings,  MD Electronically Signed Final Report   09/30/2018 01:44 pm ----------------------------------------------------------------------  MAU Course  Procedures  MDM -provider and staff called to waiting room for syncopal patient, provider arrived with patient already in wheelchair, coherent, able to speak with staff, but mildly slowed in response (this improved over the course of her time in MAU to a return to normal) -after triage process was completed by nurse, patient reporting cough and congestion for 1.5wks. This triggered the RN to place patient under precautions for COVID (pt denies known exposures) but not until after many staff were exposed to patient without proper PPE. Non-rapid COVID swab performed after consultation with Dr. Rosana Hoes. -pt had two severe range BPs shortly after admission to MAU, labetalol protocol entered, sinlge dose of 20mg  labetalol  needed to bring BPs back to non-severe range. -consulted with Dr. Earlene Plater . Per Dr. Earlene Plater, pt does not qualify for chronic hypertension with superimposed preeclampsia by BP alone. Dr. Earlene Plater recommends to given 1L LR, see if headache improves and wait for labs. -1L LR bolus ordered -UA: hazy/5ketones/sm leuks/rare bacteria -CBC: WNL, platelets 222 (were 178 3wks ago) -CMP: no abnormalities requiring treatment, AST/ALT 20/13 (were 9/7 3weeks ago) -PCr: 0.10 -UDS: +THC -after administration of fluids, pt reports HA and N/V resolved EFM: reactive       -baseline: 130/135       -variability: moderate       -accels: present, 15x15       -decels: absent       -TOCO: few mild, irregular ctx -consulted with Dr. Earlene Plater  after lab results available. Per Dr. Earlene Plater, start patient on  labetalol and have patient f/u in office on Wednesday for repeat BP check. Pt to stop taking HCTZ (was advised to do this at NOB visit). Discussed that syncopal episode likely vasovagal response. -EKG performed early in course of patient care, but difficulty  with uploading results to chart for cardiology review extended time between results being printed and read. While awaiting read of EKG by cardiologist, pt elects to leave AMA, paperwork signed with nursing staff. Per cardiology, EKG results normal.  -called patient later in the day  after she left AMA to discuss plan. Called on mobile phone, no answer, left voicemail requesting call to MAU provider office. Called home number, no answer, message stating "your call cannot be completed as dialed." -message sent to MFM and Dr. Vergie Living regarding visit as they have upcoming appointments with patient this week.  Orders Placed This Encounter  Procedures  . SARS CORONAVIRUS 2 (TAT 6-24 HRS) Nasopharyngeal Nasopharyngeal Swab    Standing Status:   Standing    Number of Occurrences:   1    Order Specific Question:   Is this test for diagnosis or screening    Answer:   Screening    Order Specific Question:   Symptomatic for COVID-19 as defined by CDC    Answer:   No    Order Specific Question:   Hospitalized for COVID-19    Answer:   No    Order Specific Question:   Admitted to ICU for COVID-19    Answer:   No    Order Specific Question:   Previously tested for COVID-19    Answer:   No    Order Specific Question:   Resident in a congregate (group) care setting    Answer:   No    Order Specific Question:   Employed in healthcare setting    Answer:   No    Order Specific Question:   Pregnant    Answer:   Yes    Order Specific Question:   Pre-procedural testing    Answer:   Yes  . Comprehensive metabolic panel    Standing Status:   Standing    Number of Occurrences:   1  . CBC with Differential/Platelet    Standing Status:   Standing    Number of Occurrences:   1  . Urinalysis, Routine w reflex microscopic    Standing Status:   Standing    Number of Occurrences:   1  . Protein / creatinine ratio, urine    Standing Status:   Standing    Number of Occurrences:   1  . Urine rapid drug  screen (hosp  performed)    Standing Status:   Standing    Number of Occurrences:   1  . EKG 12-Lead    Standing Status:   Standing    Number of Occurrences:   1    Order Specific Question:   Reason for Exam    Answer:   fainting in pregnancy   Meds ordered this encounter  Medications  . DISCONTD: labetalol (NORMODYNE) injection 20 mg  . DISCONTD: labetalol (NORMODYNE) injection 40 mg  . DISCONTD: labetalol (NORMODYNE) injection 80 mg  . DISCONTD: hydrALAZINE (APRESOLINE) injection 10 mg  . lactated ringers bolus 1,000 mL   Assessment and Plan   1. Chronic hypertension with exacerbation during pregnancy in third trimester   2. Supervision of high risk pregnancy, antepartum   3. Nausea and vomiting during pregnancy   4. Vasovagal syncope   5. Pregnancy headache in third trimester   6. [redacted] weeks gestation of pregnancy   7. NST (non-stress test) reactive    -pt left AMA, prior to review of labs and plan with patient  Odie Sera Zenith Lamphier 10/10/2018, 7:00 PM

## 2018-10-10 NOTE — Progress Notes (Signed)
Patient requesting to leave without EKG being finished.  Notified that the provider wanted to EKG done prior to her discharge.  Patient left AMA and signed paperwork.  Walked out of MAU with her sister.

## 2018-10-10 NOTE — MAU Note (Signed)
Patient states she was at the bathroom sink this morning feeling hot and nauseated and threw up, then woke up on the floor with people surrounding her. Pt does not think she fell on her stomach, but states her lower abdomen feels "very uncomfortable, tight." Rates pain 4/10.  Denies vaginal bleeding or LOF.  Reports feeling fetal movement.

## 2018-10-11 ENCOUNTER — Ambulatory Visit (HOSPITAL_COMMUNITY): Payer: Self-pay | Attending: Obstetrics and Gynecology

## 2018-10-11 ENCOUNTER — Ambulatory Visit (HOSPITAL_COMMUNITY): Payer: Self-pay

## 2018-10-12 ENCOUNTER — Telehealth (INDEPENDENT_AMBULATORY_CARE_PROVIDER_SITE_OTHER): Payer: Medicaid Other | Admitting: Obstetrics and Gynecology

## 2018-10-12 ENCOUNTER — Other Ambulatory Visit: Payer: Self-pay

## 2018-10-12 ENCOUNTER — Encounter: Payer: Self-pay | Admitting: Obstetrics and Gynecology

## 2018-10-12 ENCOUNTER — Telehealth: Payer: Self-pay | Admitting: Obstetrics and Gynecology

## 2018-10-12 DIAGNOSIS — O0930 Supervision of pregnancy with insufficient antenatal care, unspecified trimester: Secondary | ICD-10-CM

## 2018-10-12 DIAGNOSIS — O09893 Supervision of other high risk pregnancies, third trimester: Secondary | ICD-10-CM

## 2018-10-12 DIAGNOSIS — O0993 Supervision of high risk pregnancy, unspecified, third trimester: Secondary | ICD-10-CM | POA: Diagnosis not present

## 2018-10-12 DIAGNOSIS — O0990 Supervision of high risk pregnancy, unspecified, unspecified trimester: Secondary | ICD-10-CM

## 2018-10-12 DIAGNOSIS — Z3A36 36 weeks gestation of pregnancy: Secondary | ICD-10-CM

## 2018-10-12 DIAGNOSIS — E876 Hypokalemia: Secondary | ICD-10-CM | POA: Insufficient documentation

## 2018-10-12 DIAGNOSIS — O0933 Supervision of pregnancy with insufficient antenatal care, third trimester: Secondary | ICD-10-CM | POA: Diagnosis not present

## 2018-10-12 DIAGNOSIS — I10 Essential (primary) hypertension: Secondary | ICD-10-CM

## 2018-10-12 MED ORDER — POTASSIUM CHLORIDE CRYS ER 20 MEQ PO TBCR: 40.0000 meq | EXTENDED_RELEASE_TABLET | Freq: Every day | ORAL | 0 refills | Status: DC

## 2018-10-12 MED ORDER — HYDROCHLOROTHIAZIDE 25 MG PO TABS: 25.0000 mg | ORAL_TABLET | Freq: Every day | ORAL | 0 refills | Status: DC

## 2018-10-12 NOTE — Progress Notes (Signed)
I connected with  Patricia Millet on 10/12/18 at  1:55 PM EDT by telephone and verified that I am speaking with the correct person using two identifiers.   I discussed the limitations, risks, security and privacy concerns of performing an evaluation and management service by telephone and the availability of in person appointments. I also discussed with the patient that there may be a patient responsible charge related to this service. The patient expressed understanding and agreed to proceed.   Patricia Carmine, RN 10/12/2018  2:16 PM

## 2018-10-12 NOTE — Telephone Encounter (Signed)
Spoke to patient with her updated appointments. [place] recommends that the patient go to MAU for her NST/BPP since their is no availability this week with MFM or the doctor's office. Patient instructed to go to MAU tomorrow.

## 2018-10-12 NOTE — Progress Notes (Signed)
TELEHEALTH VIRTUAL OBSTETRICS VISIT ENCOUNTER NOTE  Clinic: [facility]  I connected with Patricia Millet on 10/12/18 at  1:55 PM EDT by telephone at home and verified that I am speaking with the correct person using two identifiers.   I discussed the limitations, risks, security and privacy concerns of performing an evaluation and management service by telephone and the availability of in person appointments. I also discussed with the patient that there may be a patient responsible charge related to this service. The patient expressed understanding and agreed to proceed.  Subjective:  Patricia Warren is a 23 y.o. G3P2002 at [redacted]w[redacted]d being followed for ongoing prenatal care.  She is currently monitored for the following issues for this high-risk pregnancy and has Smoker; Marijuana use; Insufficient prenatal care in third trimester; Chlamydia infection affecting pregnancy in second trimester; Family history of bipolar disorder; Chronic hypertension; Supervision of high risk pregnancy, antepartum; Short interval between pregnancies affecting pregnancy in third trimester, antepartum; Late prenatal care; and Hypokalemia on their problem list.  Patient reports feeling tired. no visual or neuro s/s and normal FM and no labor s/s.Marland Kitchen Reports fetal movement. Denies any contractions, bleeding or leaking of fluid.   The following portions of the patient's history were reviewed and updated as appropriate: allergies, current medications, past family history, past medical history, past social history, past surgical history and problem list.   Objective:  There were no vitals filed for this visit.  Babyscripts Data Reviewed: not applicable  General:  Alert, oriented and cooperative.   Mental Status: Normal mood and affect perceived. Normal judgment and thought content.  Rest of physical exam deferred due to type of encounter  Assessment and Plan:  Pregnancy: G3P2002 at [redacted]w[redacted]d 1.  Supervision of high risk pregnancy, antepartum -GBS next visit -Wants BTL. Sign papers once becomes active  2. Chronic hypertension -Patient states she missed her mfm growth u/s and bpp yesterday b/c she fell asleep -Normal growth u/s (efw 31%, ac 21%) and bpp 8/8 on 9/25 D/w her need for close surveillance. -She states she is still taking the hctz and only has a few pills left. I told her to stay on it since her BPs are normal to mild range on the medications -Recommend recheck potassium at nv. Kdur sent in -Given cHTN on medications, I told her I recommend 37wk delivery in her situation. D/w her re: delivery in the early term period in terms of potential neonatal issues. Pt is amenable to this.   3. Late prenatal care  4. Insufficient prenatal care in third trimester  5. Short interval between pregnancies affecting pregnancy in third trimester, antepartum  Preterm labor symptoms and general obstetric precautions including but not limited to vaginal bleeding, contractions, leaking of fluid and fetal movement were reviewed in detail with the patient.  I discussed the assessment and treatment plan with the patient. The patient was provided an opportunity to ask questions and all were answered. The patient agreed with the plan and demonstrated an understanding of the instructions. The patient was advised to call back or seek an in-person office evaluation/go to MAU at [facility] for any urgent or concerning symptoms. Please refer to After Visit Summary for other counseling recommendations.   I provided 10 minutes of non-face-to-face time during this encounter. The visit was conducted via Phone-medicine  Return in about 1 day (around 10/13/2018) for nst/bpp with diane, high risk, in person.  Future Appointments  Date Time Provider [place]  10/17/2018  9:35 AM Whitmire Bing, MD [facility]  10/24/2018  9:35 AM Hermina Staggers, MD [facility]  10/31/2018  9:35  AM Reva Bores, MD [facility]  11/07/2018  9:15 AM Levie Heritage, DO [facility]    [place] Bing, MD [facility]

## 2018-10-13 ENCOUNTER — Telehealth (HOSPITAL_COMMUNITY): Payer: Self-pay | Admitting: *Deleted

## 2018-10-13 ENCOUNTER — Encounter (HOSPITAL_COMMUNITY): Payer: Self-pay | Admitting: *Deleted

## 2018-10-13 NOTE — Telephone Encounter (Signed)
Preadmission screen  

## 2018-10-13 NOTE — Progress Notes (Signed)
IOL scheduling form faxed to L&D.  Pt notified that someone will call her with her induction instructions.

## 2018-10-14 ENCOUNTER — Other Ambulatory Visit: Payer: Self-pay | Admitting: Advanced Practice Midwife

## 2018-10-16 ENCOUNTER — Other Ambulatory Visit (HOSPITAL_COMMUNITY)
Admission: RE | Admit: 2018-10-16 | Discharge: 2018-10-16 | Disposition: A | Discharge status: Home / Self Care | Payer: Self-pay | Source: Ambulatory Visit

## 2018-10-17 ENCOUNTER — Other Ambulatory Visit: Payer: Self-pay

## 2018-10-17 ENCOUNTER — Other Ambulatory Visit (HOSPITAL_COMMUNITY)
Admission: RE | Admit: 2018-10-17 | Discharge: 2018-10-17 | Disposition: A | Discharge status: Home / Self Care | Payer: Medicaid Other | Source: Ambulatory Visit | Attending: Obstetrics and Gynecology | Admitting: Obstetrics and Gynecology

## 2018-10-17 ENCOUNTER — Ambulatory Visit (INDEPENDENT_AMBULATORY_CARE_PROVIDER_SITE_OTHER): Payer: Medicaid Other | Admitting: Obstetrics and Gynecology

## 2018-10-17 VITALS — BP 137/92 | HR 90 | Temp 98.5°F | Wt 209.4 lb

## 2018-10-17 DIAGNOSIS — O0990 Supervision of high risk pregnancy, unspecified, unspecified trimester: Secondary | ICD-10-CM | POA: Diagnosis present

## 2018-10-17 DIAGNOSIS — I10 Essential (primary) hypertension: Secondary | ICD-10-CM

## 2018-10-17 DIAGNOSIS — O0930 Supervision of pregnancy with insufficient antenatal care, unspecified trimester: Secondary | ICD-10-CM

## 2018-10-17 DIAGNOSIS — F1290 Cannabis use, unspecified, uncomplicated: Secondary | ICD-10-CM

## 2018-10-17 DIAGNOSIS — O98813 Other maternal infectious and parasitic diseases complicating pregnancy, third trimester: Secondary | ICD-10-CM | POA: Diagnosis not present

## 2018-10-17 DIAGNOSIS — O09893 Supervision of other high risk pregnancies, third trimester: Secondary | ICD-10-CM

## 2018-10-17 DIAGNOSIS — A749 Chlamydial infection, unspecified: Secondary | ICD-10-CM

## 2018-10-17 DIAGNOSIS — E876 Hypokalemia: Secondary | ICD-10-CM

## 2018-10-17 DIAGNOSIS — Z3A36 36 weeks gestation of pregnancy: Secondary | ICD-10-CM

## 2018-10-17 DIAGNOSIS — O0933 Supervision of pregnancy with insufficient antenatal care, third trimester: Secondary | ICD-10-CM | POA: Diagnosis not present

## 2018-10-17 DIAGNOSIS — O98812 Other maternal infectious and parasitic diseases complicating pregnancy, second trimester: Secondary | ICD-10-CM

## 2018-10-17 DIAGNOSIS — O0993 Supervision of high risk pregnancy, unspecified, third trimester: Secondary | ICD-10-CM

## 2018-10-17 DIAGNOSIS — O99323 Drug use complicating pregnancy, third trimester: Secondary | ICD-10-CM

## 2018-10-17 NOTE — Progress Notes (Signed)
Prenatal Visit Note Date: 10/17/2018 Clinic: [facility]  Subjective:  Patricia Warren is a 23 y.o. G3P2002 at [redacted]w[redacted]d being seen today for ongoing prenatal care.  She is currently monitored for the following issues for this high-risk pregnancy and has Smoker; Marijuana use; Insufficient prenatal care in third trimester; Chlamydia infection affecting pregnancy in second trimester; Family history of bipolar disorder; Chronic hypertension; Supervision of high risk pregnancy, antepartum; Short interval between pregnancies affecting pregnancy in third trimester, antepartum; Late prenatal care; and Hypokalemia on their problem list.  Patient reports no complaints.   Contractions: Not present. Vag. Bleeding: None.  Movement: Present. Denies leaking of fluid.   The following portions of the patient's history were reviewed and updated as appropriate: allergies, current medications, past family history, past medical history, past social history, past surgical history and problem list. Problem list updated.  Objective:   Vitals:   10/17/18 1019  BP: (!) 137/92  Pulse: 90  Temp: 98.5 F (36.9 C)  Weight: 209 lb 6.4 oz (95 kg)    Fetal Status:     Movement: Present     General:  Alert, oriented and cooperative. Patient is in no acute distress.  Skin: Skin is warm and dry. No rash noted.   Cardiovascular: Normal heart rate noted  Respiratory: Normal respiratory effort, no problems with respiration noted  Abdomen: Soft, gravid, appropriate for gestational age. Pain/Pressure: Present     Pelvic:  Cervical exam deferred      patient declines  Extremities: Normal range of motion.  Edema: None  Mental Status: Normal mood and affect. Normal behavior. Normal judgment and thought content.   Urinalysis:      Assessment and Plan:  Pregnancy: G3P2002 at [redacted]w[redacted]d  1. Supervision of high risk pregnancy, antepartum Routine care. Sign BTL papers once insurance is active.  - Strep Gp B  NAA - Cervicovaginal ancillary only  2. Hypokalemia Check bmp with admit labs tomorrow.   3. Late prenatal care  4. Chronic hypertension Continue hctz 25 qday. Pt not set up for ap testing today (see last week's note). I asked her if she had time for nst today and she said "I guess."  I did a bedside u/s and it showed a bpp with easy 8/8, cephalic, afi 10 by me today. Given 8/8, can defer nst  Pt for IOL tomorrow.   Pt didn't go to covid testing yesterday b/c she didn't want repeat testing b/c last week it was negative. D/w her we'd have to assume she's positive, etc and recommend getting a rapid test tomorrow since results likely won't be back in time for 0730 IOL tomorrow.   5. Insufficient prenatal care in third trimester See above  6. Chlamydia infection affecting pregnancy in second trimester Rpt today  7. Marijuana use +THC 10/5  8. Short interval between pregnancies affecting pregnancy in third trimester, antepartum  Preterm labor symptoms and general obstetric precautions including but not limited to vaginal bleeding, contractions, leaking of fluid and fetal movement were reviewed in detail with the patient. Please refer to After Visit Summary for other counseling recommendations.  Return in about 1 month (around 11/17/2018) for in person.   Aletha Halim, MD

## 2018-10-18 ENCOUNTER — Inpatient Hospital Stay (HOSPITAL_COMMUNITY): Payer: Medicaid Other

## 2018-10-18 ENCOUNTER — Other Ambulatory Visit: Payer: Self-pay

## 2018-10-18 ENCOUNTER — Inpatient Hospital Stay (HOSPITAL_COMMUNITY)
Admission: AD | Admit: 2018-10-18 | Payer: Medicaid Other | Source: Home / Self Care | Admitting: Obstetrics and Gynecology

## 2018-10-18 ENCOUNTER — Inpatient Hospital Stay (HOSPITAL_COMMUNITY)
Admission: AD | Admit: 2018-10-18 | Discharge: 2018-10-20 | DRG: 806 | Disposition: A | Discharge status: Home / Self Care | Payer: Medicaid Other | Attending: Family Medicine | Admitting: Family Medicine

## 2018-10-18 ENCOUNTER — Encounter (HOSPITAL_COMMUNITY): Payer: Self-pay | Admitting: *Deleted

## 2018-10-18 DIAGNOSIS — A749 Chlamydial infection, unspecified: Secondary | ICD-10-CM | POA: Diagnosis present

## 2018-10-18 DIAGNOSIS — F17210 Nicotine dependence, cigarettes, uncomplicated: Secondary | ICD-10-CM | POA: Diagnosis present

## 2018-10-18 DIAGNOSIS — O99334 Smoking (tobacco) complicating childbirth: Secondary | ICD-10-CM | POA: Diagnosis present

## 2018-10-18 DIAGNOSIS — O1002 Pre-existing essential hypertension complicating childbirth: Principal | ICD-10-CM | POA: Diagnosis present

## 2018-10-18 DIAGNOSIS — Z3A37 37 weeks gestation of pregnancy: Secondary | ICD-10-CM | POA: Diagnosis not present

## 2018-10-18 DIAGNOSIS — F1290 Cannabis use, unspecified, uncomplicated: Secondary | ICD-10-CM | POA: Diagnosis present

## 2018-10-18 DIAGNOSIS — O0933 Supervision of pregnancy with insufficient antenatal care, third trimester: Secondary | ICD-10-CM

## 2018-10-18 DIAGNOSIS — O0990 Supervision of high risk pregnancy, unspecified, unspecified trimester: Secondary | ICD-10-CM

## 2018-10-18 DIAGNOSIS — O10919 Unspecified pre-existing hypertension complicating pregnancy, unspecified trimester: Secondary | ICD-10-CM | POA: Diagnosis present

## 2018-10-18 DIAGNOSIS — Z818 Family history of other mental and behavioral disorders: Secondary | ICD-10-CM

## 2018-10-18 DIAGNOSIS — Z20828 Contact with and (suspected) exposure to other viral communicable diseases: Secondary | ICD-10-CM | POA: Diagnosis present

## 2018-10-18 DIAGNOSIS — O99324 Drug use complicating childbirth: Secondary | ICD-10-CM | POA: Diagnosis present

## 2018-10-18 DIAGNOSIS — O98812 Other maternal infectious and parasitic diseases complicating pregnancy, second trimester: Secondary | ICD-10-CM | POA: Diagnosis present

## 2018-10-18 DIAGNOSIS — O09893 Supervision of other high risk pregnancies, third trimester: Secondary | ICD-10-CM

## 2018-10-18 DIAGNOSIS — I10 Essential (primary) hypertension: Secondary | ICD-10-CM | POA: Diagnosis present

## 2018-10-18 DIAGNOSIS — O0930 Supervision of pregnancy with insufficient antenatal care, unspecified trimester: Secondary | ICD-10-CM

## 2018-10-18 LAB — CBC
HCT: 36.1 % (ref 36.0–46.0)
Hemoglobin: 12.2 g/dL (ref 12.0–15.0)
MCH: 30.3 pg (ref 26.0–34.0)
MCHC: 33.8 g/dL (ref 30.0–36.0)
MCV: 89.6 fL (ref 80.0–100.0)
Platelets: 228 10*3/uL (ref 150–400)
RBC: 4.03 MIL/uL (ref 3.87–5.11)
RDW: 13.1 % (ref 11.5–15.5)
WBC: 8.9 10*3/uL (ref 4.0–10.5)
nRBC: 0 % (ref 0.0–0.2)

## 2018-10-18 LAB — BASIC METABOLIC PANEL
Anion gap: 9 (ref 5–15)
BUN: 8 mg/dL (ref 6–20)
CO2: 20 mmol/L — ABNORMAL LOW (ref 22–32)
Calcium: 8.8 mg/dL — ABNORMAL LOW (ref 8.9–10.3)
Chloride: 105 mmol/L (ref 98–111)
Creatinine, Ser: 0.41 mg/dL — ABNORMAL LOW (ref 0.44–1.00)
GFR calc Af Amer: >60 mL/min (ref >60)
GFR calc non Af Amer: >60 mL/min (ref >60)
Glucose, Bld: 83 mg/dL (ref 70–99)
Potassium: 3.8 mmol/L (ref 3.5–5.1)
Sodium: 134 mmol/L — ABNORMAL LOW (ref 135–145)

## 2018-10-18 LAB — PROTEIN / CREATININE RATIO, URINE
Creatinine, Urine: 141.22 mg/dL
Protein Creatinine Ratio: 0.05 mg/mg{Cre} (ref 0.00–0.15)
Total Protein, Urine: 7 mg/dL

## 2018-10-18 LAB — SARS CORONAVIRUS 2 BY RT PCR (HOSPITAL ORDER, PERFORMED IN ~~LOC~~ HOSPITAL LAB): SARS Coronavirus 2: NEGATIVE

## 2018-10-18 LAB — TYPE AND SCREEN
ABO/RH(D): O POS
Antibody Screen: NEGATIVE

## 2018-10-18 LAB — ABO/RH: ABO/RH(D): O POS

## 2018-10-18 MED ORDER — TERBUTALINE SULFATE 1 MG/ML IJ SOLN: 0.2500 mg | Freq: Once | INTRAMUSCULAR | Status: DC | PRN

## 2018-10-18 MED ORDER — ACETAMINOPHEN 325 MG PO TABS: 650.0000 mg | ORAL_TABLET | ORAL | Status: DC | PRN

## 2018-10-18 MED ORDER — OXYCODONE-ACETAMINOPHEN 5-325 MG PO TABS: 2.0000 | ORAL_TABLET | ORAL | Status: DC | PRN

## 2018-10-18 MED ORDER — MISOPROSTOL 50MCG HALF TABLET
50.0000 ug | ORAL_TABLET | ORAL | Status: DC | PRN
  Filled 2018-10-18: qty 1

## 2018-10-18 MED ORDER — MISOPROSTOL 25 MCG QUARTER TABLET
ORAL_TABLET | ORAL | Status: AC
  Filled 2018-10-18: qty 1

## 2018-10-18 MED ORDER — LACTATED RINGERS IV SOLN: 500.0000 mL | INTRAVENOUS | Status: DC | PRN

## 2018-10-18 MED ORDER — OXYTOCIN BOLUS FROM INFUSION
500.0000 mL | Freq: Once | INTRAVENOUS | Status: AC
  Administered 2018-10-19: 500 mL via INTRAVENOUS

## 2018-10-18 MED ORDER — SOD CITRATE-CITRIC ACID 500-334 MG/5ML PO SOLN: 30.0000 mL | ORAL | Status: DC | PRN

## 2018-10-18 MED ORDER — LACTATED RINGERS IV SOLN
INTRAVENOUS | Status: DC
  Administered 2018-10-18 – 2018-10-19 (×3): via INTRAVENOUS

## 2018-10-18 MED ORDER — MISOPROSTOL 25 MCG QUARTER TABLET
25.0000 ug | ORAL_TABLET | ORAL | Status: DC | PRN
  Administered 2018-10-18: 25 ug via VAGINAL

## 2018-10-18 MED ORDER — OXYCODONE-ACETAMINOPHEN 5-325 MG PO TABS: 1.0000 | ORAL_TABLET | ORAL | Status: DC | PRN

## 2018-10-18 MED ORDER — ONDANSETRON HCL 4 MG/2ML IJ SOLN: 4.0000 mg | Freq: Four times a day (QID) | INTRAMUSCULAR | Status: DC | PRN

## 2018-10-18 MED ORDER — OXYTOCIN 40 UNITS IN NORMAL SALINE INFUSION - SIMPLE MED
1.0000 m[IU]/min | INTRAVENOUS | Status: DC
  Administered 2018-10-18: 2 m[IU]/min via INTRAVENOUS

## 2018-10-18 MED ORDER — OXYTOCIN 40 UNITS IN NORMAL SALINE INFUSION - SIMPLE MED
2.5000 [IU]/h | INTRAVENOUS | Status: DC
  Filled 2018-10-18: qty 1000

## 2018-10-18 MED ORDER — FENTANYL CITRATE (PF) 100 MCG/2ML IJ SOLN: 100.0000 ug | INTRAMUSCULAR | Status: DC | PRN

## 2018-10-18 MED ORDER — LIDOCAINE HCL (PF) 1 % IJ SOLN: 30.0000 mL | INTRAMUSCULAR | Status: DC | PRN

## 2018-10-18 NOTE — Progress Notes (Signed)
Patient ID: Patricia Warren, female   DOB: 21-Sep-1995, 23 y.o.   MRN: [id number] More uncomfortable  Vitals:   10/18/18 1740 10/18/18 1756 10/18/18 1819 10/18/18 2102  BP: 128/83   130/67  Pulse: 80   82  Resp:  18 18   Weight:      Height:       FHR reactive UCs irregular  Dilation: 2 Effacement (%): 60 Cervical Position: Middle Station: -3 Presentation: Vertex Exam by:: Hansel Feinstein, CNM  Will start Pitocin  Anticipated increased progress

## 2018-10-18 NOTE — H&P (Addendum)
Patricia Warren is a 23 y.o. female presenting for IOL for Chronic Hypertension, on irregular use of HCTZ 25 q day. Patient states she has not taken this medication in "about a week". At time of admission she denies vaginal bleeding, leaking of fluid, decreased fetal movement, fever, falls, or recent illness. She also denies headache, visual disturbances, RUQ/epigastric pain, new onset swelling or weight gain.  Prenatal History --Dating by 77 week [place] --Care initiated at 29 weeks --No genetic screening --Limited surveillance appts but reassuring per MFM --Rubella Immune  . OB History    Gravida  3   Para  2   Term  2   Preterm      AB      Living  2     SAB      TAB      Ectopic      Multiple  0   Live Births  2          Patient Active Problem List   Diagnosis Date Noted  . Chronic hypertension in pregnancy 10/18/2018  . Hypokalemia 10/12/2018  . Short interval between pregnancies affecting pregnancy in third trimester, antepartum 10/03/2018  . Late prenatal care 10/03/2018  . Supervision of high risk pregnancy, antepartum 09/08/2018  . Chronic hypertension 05/24/2017  . Family history of bipolar disorder 02/25/2017  . Chlamydia infection affecting pregnancy in second trimester 02/01/2017  . Insufficient prenatal care in third trimester 05/30/2014  . Marijuana use 03/20/2014  . Smoker     Past Medical History:  Diagnosis Date  . Chlamydia infection affecting pregnancy in first trimester, antepartum   . Hypertension   . Mild obesity   . Smoker   . Trichimoniasis    Past Surgical History:  Procedure Laterality Date  . WISDOM TOOTH EXTRACTION     2012   Family History: family history includes Asthma in her brother; Hypertension in her mother. Social History:  reports that she has been smoking cigarettes. She has been smoking about 0.50 packs per day. She has never used smokeless tobacco. She reports previous alcohol use of about 1.0 standard drinks of  alcohol per week. She reports that she does not use drugs.     Maternal Diabetes: No Genetic Screening: Declined Maternal Ultrasounds/Referrals: Normal Fetal Ultrasounds or other Referrals:  None Maternal Substance Abuse:  Yes:  Type: Smoker, Marijuana Significant Maternal Medications:  N/A Significant Maternal Lab Results:  Other: GBS collected 10/17/18 Other Comments:    Review of Systems  Constitutional: Negative for fever.  Respiratory: Negative for shortness of breath.   Gastrointestinal: Negative for abdominal pain.  Musculoskeletal: Negative for back pain.  Neurological: Negative for headaches.  All other systems reviewed and are negative.    Blood pressure 133/80, pulse 88, resp. rate 18, height 5\' 9"  (1.753 m), weight 95 kg, unknown if currently breastfeeding. Physical Exam  Nursing note and vitals reviewed. Constitutional: She is oriented to person, place, and time. She appears well-developed and well-nourished.  Cardiovascular: Normal rate.  Respiratory: Effort normal and breath sounds normal.  GI: Soft. She exhibits no distension. There is no abdominal tenderness. There is no rebound and no guarding.  Gravid  Genitourinary:    Vagina normal.     No vaginal discharge.   Neurological: She is alert and oriented to person, place, and time.  Skin: Skin is warm and dry.  Psychiatric: She has a normal mood and affect. Her behavior is normal. Judgment and thought content normal.    Prenatal labs:  ABO, Rh: O/Positive/-- (09/11 1132) Antibody: Negative (09/11 1132) Rubella: 2.95 (09/11 1132) RPR: Non Reactive (09/11 1132)  HBsAg: Negative (09/11 1132)  HIV: Non Reactive (09/11 1132)  GBS:   Unknown  Fetal Surveillance --Category I tracing --Baseline 150, moderate variability, positive accels, no decels --Toco: rare contractions, uterine irritability  Patient Vitals for the past 24 hrs:  BP Pulse Resp Height Weight  10/18/18 1756 - - 18 - -  10/18/18 1740 128/83  80 - - -  10/18/18 1735 - - 18 - -  10/18/18 1729 - - 18 - -  10/18/18 1716 - - - 5\' 9"  (1.753 m) 95 kg  10/18/18 1706 133/80 88 18 - -   Assessment/Plan: --23 y.o. G3P2002 at [redacted]w[redacted]d  --Hx Term SVD x 2. Proven pelvis 2971g --Chronic HTN, no severe signs or symptoms. PEC labs ordered --Category I tracing --S/p Cytotec 25 mcg vaginal at 1737. Assess for additional dose or bridge to Pitocin in 4 hours --Foley balloon placement attempted. Patient unable to tolerate digital contact with cervix or friction of bulb passing through cervical os. Attempt discontinued --Desires epidural --boy(outpatient circ)/bottle/outpatient BTL  Crissie Figures Weinhold,CNM 10/18/2018, 6:16 PM

## 2018-10-19 ENCOUNTER — Encounter (HOSPITAL_COMMUNITY): Payer: Self-pay | Admitting: General Practice

## 2018-10-19 ENCOUNTER — Inpatient Hospital Stay (HOSPITAL_COMMUNITY): Payer: Medicaid Other | Admitting: Anesthesiology

## 2018-10-19 ENCOUNTER — Other Ambulatory Visit: Payer: Self-pay

## 2018-10-19 ENCOUNTER — Ambulatory Visit (HOSPITAL_COMMUNITY): Payer: Self-pay

## 2018-10-19 ENCOUNTER — Inpatient Hospital Stay (HOSPITAL_COMMUNITY)
Admission: RE | Admit: 2018-10-19 | Discharge: 2018-10-19 | Disposition: A | Discharge status: Home / Self Care | Payer: Self-pay | Source: Ambulatory Visit | Attending: Obstetrics and Gynecology | Admitting: Obstetrics and Gynecology

## 2018-10-19 DIAGNOSIS — Z3A37 37 weeks gestation of pregnancy: Secondary | ICD-10-CM

## 2018-10-19 DIAGNOSIS — O1002 Pre-existing essential hypertension complicating childbirth: Secondary | ICD-10-CM

## 2018-10-19 LAB — RPR: RPR Ser Ql: NONREACTIVE

## 2018-10-19 LAB — STREP GP B NAA: Strep Gp B NAA: NEGATIVE

## 2018-10-19 MED ORDER — OXYTOCIN BOLUS FROM INFUSION
500.0000 mL | Freq: Once | INTRAVENOUS | Status: AC
  Administered 2018-10-19: 500 mL via INTRAVENOUS

## 2018-10-19 MED ORDER — DIPHENHYDRAMINE HCL 25 MG PO CAPS: 25.0000 mg | ORAL_CAPSULE | Freq: Four times a day (QID) | ORAL | Status: DC | PRN

## 2018-10-19 MED ORDER — FENTANYL-BUPIVACAINE-NACL 0.5-0.125-0.9 MG/250ML-% EP SOLN
12.0000 mL/h | EPIDURAL | Status: DC | PRN
  Filled 2018-10-19: qty 250

## 2018-10-19 MED ORDER — OXYCODONE HCL 5 MG PO TABS
10.0000 mg | ORAL_TABLET | ORAL | Status: DC | PRN
  Administered 2018-10-19: 10 mg via ORAL
  Filled 2018-10-19: qty 2

## 2018-10-19 MED ORDER — DIBUCAINE (PERIANAL) 1 % EX OINT: 1.0000 "application " | TOPICAL_OINTMENT | CUTANEOUS | Status: DC | PRN

## 2018-10-19 MED ORDER — OXYCODONE HCL 5 MG PO TABS: 5.0000 mg | ORAL_TABLET | ORAL | Status: DC | PRN

## 2018-10-19 MED ORDER — WITCH HAZEL-GLYCERIN EX PADS: 1.0000 "application " | MEDICATED_PAD | CUTANEOUS | Status: DC | PRN

## 2018-10-19 MED ORDER — PRENATAL MULTIVITAMIN CH
1.0000 | ORAL_TABLET | Freq: Every day | ORAL | Status: DC
  Administered 2018-10-19 – 2018-10-20 (×2): 1 via ORAL
  Filled 2018-10-19 (×2): qty 1

## 2018-10-19 MED ORDER — SODIUM CHLORIDE (PF) 0.9 % IJ SOLN
INTRAMUSCULAR | Status: DC | PRN
  Administered 2018-10-19: 12 mL/h via EPIDURAL

## 2018-10-19 MED ORDER — EPHEDRINE 5 MG/ML INJ: 10.0000 mg | INTRAVENOUS | Status: DC | PRN

## 2018-10-19 MED ORDER — ONDANSETRON HCL 4 MG/2ML IJ SOLN
4.0000 mg | INTRAMUSCULAR | Status: DC | PRN
  Administered 2018-10-19: 4 mg via INTRAVENOUS
  Filled 2018-10-19: qty 2

## 2018-10-19 MED ORDER — HYDROMORPHONE HCL 1 MG/ML IJ SOLN
1.0000 mg | Freq: Once | INTRAMUSCULAR | Status: AC
  Administered 2018-10-19: 18:00:00 1 mg via INTRAVENOUS
  Filled 2018-10-19: qty 1

## 2018-10-19 MED ORDER — ONDANSETRON HCL 4 MG PO TABS: 4.0000 mg | ORAL_TABLET | ORAL | Status: DC | PRN

## 2018-10-19 MED ORDER — PHENYLEPHRINE 40 MCG/ML (10ML) SYRINGE FOR IV PUSH (FOR BLOOD PRESSURE SUPPORT): 80.0000 ug | PREFILLED_SYRINGE | INTRAVENOUS | Status: DC | PRN

## 2018-10-19 MED ORDER — LIDOCAINE-EPINEPHRINE (PF) 2 %-1:200000 IJ SOLN
INTRAMUSCULAR | Status: DC | PRN
  Administered 2018-10-19 (×2): 2 mL via EPIDURAL

## 2018-10-19 MED ORDER — METHYLERGONOVINE MALEATE 0.2 MG/ML IJ SOLN
INTRAMUSCULAR | Status: AC
  Administered 2018-10-19: 18:00:00 via INTRAMUSCULAR
  Filled 2018-10-19: qty 1

## 2018-10-19 MED ORDER — OXYTOCIN 40 UNITS IN NORMAL SALINE INFUSION - SIMPLE MED: 1.0000 m[IU]/min | INTRAVENOUS | Status: DC

## 2018-10-19 MED ORDER — COCONUT OIL OIL: 1.0000 "application " | TOPICAL_OIL | Status: DC | PRN

## 2018-10-19 MED ORDER — OXYTOCIN 40 UNITS IN NORMAL SALINE INFUSION - SIMPLE MED
INTRAVENOUS | Status: AC
  Filled 2018-10-19: qty 1000

## 2018-10-19 MED ORDER — METHYLERGONOVINE MALEATE 0.2 MG/ML IJ SOLN: 0.2000 mg | Freq: Four times a day (QID) | INTRAMUSCULAR | Status: DC

## 2018-10-19 MED ORDER — IBUPROFEN 600 MG PO TABS
600.0000 mg | ORAL_TABLET | Freq: Four times a day (QID) | ORAL | Status: DC
  Administered 2018-10-19 – 2018-10-20 (×5): 600 mg via ORAL
  Filled 2018-10-19 (×5): qty 1

## 2018-10-19 MED ORDER — SIMETHICONE 80 MG PO CHEW: 80.0000 mg | CHEWABLE_TABLET | ORAL | Status: DC | PRN

## 2018-10-19 MED ORDER — BENZOCAINE-MENTHOL 20-0.5 % EX AERO: 1.0000 "application " | INHALATION_SPRAY | CUTANEOUS | Status: DC | PRN

## 2018-10-19 MED ORDER — MAGNESIUM HYDROXIDE 400 MG/5ML PO SUSP: 30.0000 mL | ORAL | Status: DC | PRN

## 2018-10-19 MED ORDER — ACETAMINOPHEN 325 MG PO TABS: 650.0000 mg | ORAL_TABLET | ORAL | Status: DC | PRN

## 2018-10-19 MED ORDER — DIPHENHYDRAMINE HCL 50 MG/ML IJ SOLN: 12.5000 mg | INTRAMUSCULAR | Status: DC | PRN

## 2018-10-19 MED ORDER — LACTATED RINGERS IV SOLN
500.0000 mL | Freq: Once | INTRAVENOUS | Status: AC
  Administered 2018-10-19: 05:00:00 500 mL via INTRAVENOUS

## 2018-10-19 MED ORDER — SENNOSIDES-DOCUSATE SODIUM 8.6-50 MG PO TABS
2.0000 | ORAL_TABLET | ORAL | Status: DC
  Administered 2018-10-20: 2 via ORAL
  Filled 2018-10-19: qty 2

## 2018-10-19 NOTE — Discharge Summary (Signed)
Postpartum Discharge Summary    Patient Name: Patricia Warren DOB: 1995/06/10 MRN: [id number]  Date of admission: 10/18/2018 Delivering Provider: Clarnce Flock   Date of discharge: 10/20/2018  Admitting diagnosis: Preg Intrauterine pregnancy: [redacted]w[redacted]d    Secondary diagnosis:  Active Problems:   Marijuana use   Insufficient prenatal care in third trimester   Chlamydia infection affecting pregnancy in second trimester   Family history of bipolar disorder   Chronic hypertension   Supervision of high risk pregnancy, antepartum   Short interval between pregnancies affecting pregnancy in third trimester, antepartum   Late prenatal care   Chronic hypertension in pregnancy  Additional problems:      Discharge diagnosis: Term Pregnancy Delivered                                                                                                Post partum procedures:Methergine and pitocin for PP bleeding  Augmentation: AROM, Pitocin and Cytotec  Complications: None  Hospital course:  Induction of Labor With Vaginal Delivery   23y.o. yo [id number] 39w1das admitted to the hospital 10/18/2018 for induction of labor.  Indication for induction: cHTN.  Patient had an uncomplicated labor course as follows: arrived at 1 cm, induced with cytotec x1, pitocin, and AROM.  Membrane Rupture Time/Date: 4:24 AM ,10/19/2018   Intrapartum Procedures: Episiotomy: None [1]                                         Lacerations:  None [1]  Patient had delivery of a Viable infant.  Information for the patient's newborn:  BoAvaleen, Brownley0[[id number]]Delivery Method: Vaginal, Spontaneous(Filed from Delivery Summary)    10/19/2018  Details of delivery can be found in separate delivery note.    Patient had a postpartum course notable for extrusion of two large clots raising EBL to 703cc, etiology due to poor uterine tone. Given one dose of IM methergine and additional 20u of IV pitocin with good effect  and no further bleeding.   Patient is discharged home 10/20/18. Delivery time: 9:03 AM    Magnesium Sulfate received: No BMZ received: No Rhophylac:N/A MMR:N/A Transfusion:No  Physical exam  Vitals:   10/19/18 2150 10/20/18 0527 10/20/18 0923 10/20/18 1515  BP: 132/87 126/80 120/60 138/78  Pulse: 62 64 74 72  Resp: '20 18  18  ' Temp: 98.7 F (37.1 C) 98.5 F (36.9 C)  98.7 F (37.1 C)  TempSrc: Oral Oral  Oral  SpO2: 100% 100%  99%  Weight:      Height:       General: alert, cooperative and no distress Lochia: appropriate Uterine Fundus: firm Incision: N/A DVT Evaluation: No evidence of DVT seen on physical exam. No significant calf/ankle edema. Labs: Lab Results  Component Value Date   WBC 8.9 10/18/2018   HGB 12.2 10/18/2018   HCT 36.1 10/18/2018   MCV 89.6 10/18/2018   PLT 228 10/18/2018   CMP Latest Ref Rng & Units 10/18/2018  Glucose  70 - 99 mg/dL 83  BUN 6 - 20 mg/dL 8  Creatinine 0.44 - 1.00 mg/dL 0.41(L)  Sodium 135 - 145 mmol/L 134(L)  Potassium 3.5 - 5.1 mmol/L 3.8  Chloride 98 - 111 mmol/L 105  CO2 22 - 32 mmol/L 20(L)  Calcium 8.9 - 10.3 mg/dL 8.8(L)  Total Protein 6.5 - 8.1 g/dL -  Total Bilirubin 0.3 - 1.2 mg/dL -  Alkaline Phos 38 - 126 U/L -  AST 15 - 41 U/L -  ALT 0 - 44 U/L -    Discharge instruction: per After Visit Summary and "Baby and Me Booklet".  After visit meds:  Allergies as of 10/20/2018   No Known Allergies     Medication List    STOP taking these medications   bismuth subsalicylate 235 MG chewable tablet Commonly known as: PEPTO BISMOL   hydrochlorothiazide 25 MG tablet Commonly known as: HYDRODIURIL   potassium chloride SA 20 MEQ tablet Commonly known as: KLOR-CON     TAKE these medications   acetaminophen 325 MG tablet Commonly known as: Tylenol Take 2 tablets (650 mg total) by mouth every 4 (four) hours as needed (for pain scale < 4).   amLODipine 5 MG tablet Commonly known as: NORVASC Take 1 tablet (5  mg total) by mouth daily. Start taking on: October 21, 2018   aspirin EC 81 MG tablet Take 1 tablet (81 mg total) by mouth daily. Take after 12 weeks for prevention of preeclampsia later in pregnancy   ibuprofen 600 MG tablet Commonly known as: ADVIL Take 1 tablet (600 mg total) by mouth every 6 (six) hours.   naproxen sodium 220 MG tablet Commonly known as: ALEVE Take 220 mg by mouth as needed (headache).   polyethylene glycol powder 17 GM/SCOOP powder Commonly known as: GLYCOLAX/MIRALAX Take 255 g by mouth once for 1 dose.   Prenatal Vitamins 28-0.8 MG Tabs Take 28 mg by mouth daily with breakfast.       Diet: routine diet  Activity: Advance as tolerated. Pelvic rest for 6 weeks.   Outpatient follow up:4 weeks Follow up Appt: Future Appointments  Date Time Provider [place]  10/28/2018 10:00 AM Ripley Daykin  11/22/2018  1:15 PM Tresea Mall, CNM [facility]   Follow up Visit:   Please schedule this patient for Postpartum visit in: 4 weeks with the following provider: Any provider For C/S patients schedule nurse incision check in weeks 2 weeks: no High risk pregnancy complicated by: cHTN, late to [facility], MJ use Delivery mode:  SVD Anticipated Birth Control:  Plans Interval BTL, message sent to [facility] and admin staff to coordinate follow up visit PP Procedures needed: BP check  Schedule Integrated [place] visit: no    Newborn Data: Live born female  Birth Weight: 6 lb 6.5 oz (2905 g) APGAR: 8, 9  Newborn Delivery   Birth date/time: 10/19/2018 09:03:00 Delivery type: Vaginal, Spontaneous      Baby Feeding: Bottle Disposition:nursery, not yet discharged due to bilirubin, mother chose to be discharged without infant(likely d/c tomorrow for infant)   10/20/2018 Clarnce Flock, MD

## 2018-10-19 NOTE — Progress Notes (Signed)
Patient ID: Patricia Warren, female   DOB: [birth year], 23 y.o.   MRN: [id number] More uncomfortable with contractions  Vitals:   10/19/18 0212 10/19/18 0230 10/19/18 0301 10/19/18 0332  BP: 133/68 113/66 123/73 116/72  Pulse: 74 74 76 85  Resp:      Temp:      TempSrc:      Weight:      Height:       FHR reactive UCs every 1.5-2 min  Dilation: 2 Effacement (%): 80 Cervical Position: Middle Station: -1 Presentation: Vertex Exam by:: Hansel Feinstein, CNM  Since no appreciable change (except station), AROM clear fluid  Will continue to observe for increased labor

## 2018-10-19 NOTE — Progress Notes (Signed)
Patient ID: Patricia Warren, female   DOB: 03-Jul-1995, 23 y.o.   MRN: [id number] Vitals:   10/19/18 0100 10/19/18 0101 10/19/18 0200 10/19/18 0212  BP:  134/74  133/68  Pulse:  72  74  Resp: 17  17   Temp:      TempSrc:      Weight:      Height:       Fetal heart rate reactive with average variability and + accels, no decels UCs now every 1.5-2 min  Dilation: 2 Effacement (%): 60 Cervical Position: Middle Station: -3 Presentation: Vertex Exam by:: Hansel Feinstein, CNM  No recent cervix exam  Will continue plan of care

## 2018-10-19 NOTE — Progress Notes (Signed)
Patient ID: Patricia Warren, female   DOB: May 04, 1995, 23 y.o.   MRN: [id number] Doing well Epidural in place  Vitals:   10/19/18 0602 10/19/18 0632 10/19/18 0700 10/19/18 0730  BP: 123/68 113/61 (!) 114/52 115/71  Pulse: 74 74 72 76  Resp:      Temp:      TempSrc:      SpO2:      Weight:      Height:        FHR reassuring UCs more regular  Dilation: 6 Effacement (%): 90 Cervical Position: Middle Station: -1 Presentation: Vertex Exam by:: Krystal Eaton RN

## 2018-10-19 NOTE — Anesthesia Preprocedure Evaluation (Signed)
Anesthesia Evaluation  Patient identified by MRN, date of birth, ID band Patient awake    Reviewed: Allergy & Precautions, NPO status , Patient's Chart, lab work & pertinent test results  Airway Mallampati: II  TM Distance: >3 FB Neck ROM: Full    Dental no notable dental hx.    Pulmonary neg pulmonary ROS, Current Smoker,    Pulmonary exam normal breath sounds clear to auscultation       Cardiovascular hypertension, negative cardio ROS Normal cardiovascular exam Rhythm:Regular Rate:Normal     Neuro/Psych negative neurological ROS  negative psych ROS   GI/Hepatic negative GI ROS, (+)     substance abuse  marijuana use,   Endo/Other  negative endocrine ROS  Renal/GU negative Renal ROS  negative genitourinary   Musculoskeletal negative musculoskeletal ROS (+)   Abdominal   Peds  Hematology negative hematology ROS (+)   Anesthesia Other Findings   Reproductive/Obstetrics (+) Pregnancy                             Anesthesia Physical Anesthesia Plan  ASA: III  Anesthesia Plan: Epidural   Post-op Pain Management:    Induction:   PONV Risk Score and Plan: Treatment may vary due to age or medical condition  Airway Management Planned: Natural Airway  Additional Equipment:   Intra-op Plan:   Post-operative Plan:   Informed Consent: I have reviewed the patients History and Physical, chart, labs and discussed the procedure including the risks, benefits and alternatives for the proposed anesthesia with the patient or authorized representative who has indicated his/her understanding and acceptance.       Plan Discussed with: Anesthesiologist  Anesthesia Plan Comments: (Patient identified. Risks, benefits, options discussed with patient including but not limited to bleeding, infection, nerve damage, paralysis, failed block, incomplete pain control, headache, blood pressure changes,  nausea, vomiting, reactions to medication, itching, and post partum back pain. Confirmed with bedside nurse the patient's most recent platelet count. Confirmed with the patient that they are not taking any anticoagulation, have any bleeding history or any family history of bleeding disorders. Patient expressed understanding and wishes to proceed. All questions were answered. )        Anesthesia Quick Evaluation

## 2018-10-19 NOTE — Progress Notes (Signed)
Called to assess patient for passing orange sized clot after delivery with new QBL ~300cc.   Patient in bed, asymptomatic denying chest pain, SOB, dizziness, light headedness.   On exam patient with poor uterine tone but firmed with fundal massage, after I did so another large orange sized clot was extruded, new QBL 703cc on triton in room.   On frequent reassessment uterine tone repeatedly poor but good with fundal massage. Given single dose of methergine 0.2mg  (do not favor series given diagnosis of cHTN, BP's normal to mild range during admission) and will hang another bag of pitocin. Instructed nursing to continue q5 min fundal checks until tone remains firm and pitocin is infusing.   Clarnce Flock, MD/[facility] OB Fellow 5:50 PM

## 2018-10-19 NOTE — Anesthesia Postprocedure Evaluation (Signed)
Anesthesia Post Note  Patient: Patricia Warren  Procedure(s) Performed: AN AD HOC LABOR EPIDURAL     Patient location during evaluation: Mother Baby Anesthesia Type: Epidural Level of consciousness: awake Pain management: satisfactory to patient Vital Signs Assessment: post-procedure vital signs reviewed and stable Respiratory status: spontaneous breathing Cardiovascular status: stable Anesthetic complications: no    Last Vitals:  Vitals:   10/19/18 1110 10/19/18 1220  BP: 138/74 (!) 143/80  Pulse: 63 68  Resp: 20 18  Temp: 36.6 C 36.8 C  SpO2: 100% 100%    Last Pain:  Vitals:   10/19/18 1220  TempSrc: Oral  PainSc: 0-No pain   Pain Goal:                   [employer]

## 2018-10-19 NOTE — Progress Notes (Signed)
This RN received call from unit secretary that pt on toilet in pain. Upon entering room pt noted to be sitting on toilet doubled over with head in FOB arm moaning d/t pain. Pt states "everything hurts and I feel like I have to push." This RN pulled safety cord in bathroom for extra assistance. Pt lets out a loud moan and this RN heard to large gushes of fluid go into the toilet.  Used [facility] to transport pt back to bed. Terrilee Files, RN did a fundal rub that yielded a clot the size of an orange; fundus firm U/2. VSS see flow sheet. This RN pulled 2 orange sized clots from the toilet. Per triton EBL 270. Pt states she is no longer in pain since she passed the clots.  1732 This RN called to inform Dr. Dione Plover of happenings; per MD he will be to see pt soon. MD at bedside within 11min.  71 With Dr. Dione Plover still in room pt used call bell to call nurses station to stating "it's happening again." This RN and Hermine Messick, RN went in pt's room. While this RN doing fundal massage; cantaloupe size clot noted from pt's vagina. Per triton EBL 703 Received verbal order to give pt one dose of 0.2mg  of methergine IM. Per Dr. Dione Plover will not do methergine series d/t pt hx of CHTN. Methergine administered in pt's right ant thigh by Hermine Messick, RN. Received orders from Dr. Dione Plover to give pt 1mg  Dilaudid IV and 500CC Pitocin bolus. VSS.

## 2018-10-19 NOTE — Progress Notes (Signed)
Verbal ordered given by Dr. Ambrose Pancoast that pt does not need repeat CBC after delivery since pt is chronic hypertensive and not preeclamptic.  Epidural removed after delivery.  Will continue to monitor.   Krystal Eaton RN

## 2018-10-19 NOTE — Anesthesia Procedure Notes (Signed)
Epidural Patient location during procedure: OB Start time: 10/19/2018 5:00 AM End time: 10/19/2018 5:15 AM  Staffing Anesthesiologist: Freddrick March, MD Performed: anesthesiologist   Preanesthetic Checklist Completed: patient identified, pre-op evaluation, timeout performed, IV checked, risks and benefits discussed and monitors and equipment checked  Epidural Patient position: sitting Prep: site prepped and draped and DuraPrep Patient monitoring: continuous pulse ox, blood pressure, heart rate and cardiac monitor Approach: midline Location: L3-L4 Injection technique: LOR air  Needle:  Needle type: Tuohy  Needle gauge: 17 G Needle length: 9 cm Needle insertion depth: 7 cm Catheter type: closed end flexible Catheter size: 19 Gauge Catheter at skin depth: 13 cm Test dose: negative  Assessment Sensory level: T8 Events: blood not aspirated, injection not painful, no injection resistance, negative IV test and no paresthesia  Additional Notes Patient identified. Risks/Benefits/Options discussed with patient including but not limited to bleeding, infection, nerve damage, paralysis, failed block, incomplete pain control, headache, blood pressure changes, nausea, vomiting, reactions to medication both or allergic, itching and postpartum back pain. Confirmed with bedside nurse the patient's most recent platelet count. Confirmed with patient that they are not currently taking any anticoagulation, have any bleeding history or any family history of bleeding disorders. Patient expressed understanding and wished to proceed. All questions were answered. Sterile technique was used throughout the entire procedure. Please see nursing notes for vital signs. Test dose was given through epidural catheter and negative prior to continuing to dose epidural or start infusion. Warning signs of high block given to the patient including shortness of breath, tingling/numbness in hands, complete motor block,  or any concerning symptoms with instructions to call for help. Patient was given instructions on fall risk and not to get out of bed. All questions and concerns addressed with instructions to call with any issues or inadequate analgesia.  Reason for block:procedure for pain

## 2018-10-20 MED ORDER — POLYETHYLENE GLYCOL 3350 17 GM/SCOOP PO POWD: 1.0000 | Freq: Once | ORAL | 0 refills | Status: AC

## 2018-10-20 MED ORDER — AMLODIPINE BESYLATE 5 MG PO TABS
5.0000 mg | ORAL_TABLET | Freq: Every day | ORAL | Status: DC
  Administered 2018-10-20: 5 mg via ORAL
  Filled 2018-10-20: qty 1

## 2018-10-20 MED ORDER — AMLODIPINE BESYLATE 5 MG PO TABS: 5.0000 mg | ORAL_TABLET | Freq: Every day | ORAL | 5 refills | Status: DC

## 2018-10-20 MED ORDER — IBUPROFEN 600 MG PO TABS: 600.0000 mg | ORAL_TABLET | Freq: Four times a day (QID) | ORAL | 0 refills | Status: DC

## 2018-10-20 MED ORDER — ACETAMINOPHEN 325 MG PO TABS: 650.0000 mg | ORAL_TABLET | ORAL | 0 refills | Status: DC | PRN

## 2018-10-20 MED FILL — POLYETHYLENE GLYCOL 3350 PO: 17 | 1 days supply | Qty: 238 | Fill #0

## 2018-10-20 MED FILL — AMLODIPINE BESYLATE 5 MG TA: 5 | 30 days supply | Qty: 30 | Fill #0

## 2018-10-20 MED FILL — ACETAMINOPHEN 325 MG TABS: 325 | 3 days supply | Qty: 30 | Fill #0

## 2018-10-20 MED FILL — IBUPROFEN 600 MG TABLET: 600 | 7 days supply | Qty: 30 | Fill #0

## 2018-10-20 NOTE — Progress Notes (Signed)
CSW made [place] CPS report for infant's positive UDS for THC.    Patricia Warren S. Mauria Asquith, MSW, LCSW [facility] [phone]  

## 2018-10-20 NOTE — Discharge Instructions (Signed)

## 2018-10-20 NOTE — Progress Notes (Signed)
CSW received phone call regarding MOB and CPS report. CSW spoke with [place] CPS intake and was advised that case was initially set as a 72 hour case. CSW was updated of further details from staff in which CSW updated CPS intake of. CSW sent over Pediatricians note to CPS as there were further concerns in regards to infant and MOB's current desire to leave hospital without infant being medically stable. If MOB chooses to take infant AMA, [place] CPS would need to be notified of this.  CSW provided after hours number for CPS to staff. CSW was then informed that MOB is considering leaving infant in the nursery and visiting when allowed. CSW will continue to follow for further needs.      Audric Venn S. Jaray Boliver, MSW, LCSW [facility] [phone]   

## 2018-10-20 NOTE — Clinical Social Work Maternal (Signed)
CLINICAL SOCIAL WORK MATERNAL/CHILD NOTE  Patient Details  Name: Patricia Warren MRN: [id number] Date of Birth: 08/08/1995  Date:  10/20/2018  Clinical Social Worker Initiating Note:  Hortencia PilarKierra Zaylei Mullane. LCSW Date/Time: Initiated:  10/20/18/1049     Child's Name:  Patricia Warren   Biological Parents:  Mother, Father   Need for Interpreter:  None   Reason for Referral:  Behavioral Health Concerns, Current Substance Use/Substance Use During Pregnancy , Late or No Prenatal Care    Address:  [address] [place] [address]    Phone number:  [phone] (home)     Additional phone number: none   Household Members/Support Persons (HM/SP):       HM/SP Name Relationship DOB or Age  HM/SP -1  Patricia BrakemanSativa Warren   [facility]   22  HM/SP -2  Orland MustardNeal Mcauley   FOB     HM/SP -3  Barrett HenleCarolyn Jones   daughter   Jun 03, 2014  HM/SP -4  Audley HoseRudy Mae Mcauley   daughter   May 22, 2017   HM/SP -5        HM/SP -6        HM/SP -7        HM/SP -8          Natural Supports (not living in the home):  Friends, Extended Family   Professional Supports: None   Employment: Unemployed   Type of Work: none   Education:  High school graduate   Homebound arranged:  n/a  Surveyor, quantityinancial Resources:  Medicaid   Other Resources:  Sales executiveood Stamps , WIC   Cultural/Religious Considerations Which May Impact Care:  none reported.   Strengths:  Home prepared for child , Compliance with medical plan , Pediatrician chosen   Psychotropic Medications:    none at this time.      [place]  [place]:   [place] [facility]  [place]    [place]    [place] [facility]    [place]    [place]      Pediatrician Fax Number:    Risk Factors/Current Problems:  Substance Use    Cognitive State:  Able to Concentrate , Alert    Mood/Affect:  Blunted , Comfortable , Relaxed , Interested    CSW Assessment: CSW consulted as MOB used  THC during pregnancy. MOB also receive late PNC starting at 29 weeks. CSW went to speak with MOB at bedside to address further needs.   CSW entered room. Upon entering the room MOB greeted CSW by saying "you the case worker?". CSW-with a smile expressed to MOB that CSW is and offered to come back if now was a bad time for CSW to speak with MOB. MOB reported that it was okay for CSW to remain in the room.  CSW observed that MOB and FOB were both sitting up in bed. MOB was holding infant feeding him while FOB offered support to MOB. CSW advised MOB of HIPPA policy and MOB reported "its fine for him to stay in here". CSW understanding and advised MOB of CSW's role and the reason for CSW coming to visit with her. MOB reported that she did not know that she was even pregnant. MOB reported that she still had her monthly cycle. Per MOB she went to the doctor at about 29 weeks and was advised that she was [redacted] weeks pregnant. MOB reported that she began to feel kicks and flutters and returned to  the doctor for next appointment and was advised that she was indeed  29 weeks. MOB reports that after that appointment she went to [facility] appointments regularly. CSW verbalized to MOB that since MOB received care so late it is hospital policy that we drug screen infant. CSW at this time deciced to inquire from [facility] on her substance use while pregnant.Marland Kitchen MOB was very open and forth coming with CSW. MOB reported that she did use THC during her pregnancy as she was unable to eat even before pregnancy. MOB reported that she did have a history with CPS as her older two children were also positive for THC at the time of their births. MOB reported that those cases have sine been closed (last case for THC per MOB was in 2019). MOB informed CSW that she also had a recent CPS case as a result of "self report". Per MOB she was staying with her mom and the water and lights were being turned off. MOB reports that she had her father call CPS to make a  report. MOB reports that case was closed soon after and that she no longer lives with MOB but has her own place with FOB. CSW understanding and advised MOB that since infant is positive for [facility], CSW would make another CPS report. MOB reported that she understood and reports that her last CPS worker was [employer].   CSW inquired from [facility] on her mental health history at this time. MOB reported that she was diagnosis with anxiety and depression in her teenage years. MOB began to chuckle a little when speak about her mental health. CSW asked MOB to elaborate. MOB reports that she is tired and ready to go home. CSW very understanding and would attempt to gather more information on when they may be abel to go home. MOB then reverted back to the conversation on mental health. MOB reports that once she began having children her anxiety and depression increased. MOB reports that she did have PPD with both of her children. MOB informed CSW that she felt symptoms such as anger, irritability, and isolation (MOB attributed using THC to this in order to keep her calm as she angers easily per her report). CSW asked MOB if she spoke with OB at that time regarding symptoms and MOB reported that she did not. MOB reported that PD lasted for a few months and then she was fine. CSW took this time to provide MOB with information PPD and SIDS. MOB was given PPD Checklist to keep track of her feelings as they relate to PPD. MOB reported that she would self evaluate to be more aware of her feelings.  CSW made aware that MOB has support from her family and friends at this time. MOB reported that she has all needed items to care for infant at this time but did expressed that she may need more pampers. MOB was informed that she can take pampers in the room if needed. MOB expressed understanding.    CSW will make CPS report for infants positive drug screen. CSW will also monitor infants CDS for any further CPS updates if warranted.    CSW Plan/Description:  No Further Intervention Required/No Barriers to Discharge, Sudden Infant Death Syndrome (SIDS) Education, Perinatal Mood and Anxiety Disorder (PMADs) Education, Hospital Drug Screen Policy Information, Child Protective Service Report , CSW Will Continue to Monitor Umbilical Cord Tissue Drug Screen Results and Make Report if [place], Other Patient/Family Education    Robb Matar, LCSWA  10/20/2018, 11:18 AM

## 2018-10-20 NOTE — Progress Notes (Signed)
Post Partum Day 1 Subjective: Patient feeling well. Complaint of clot yesterday but no recurrence. Ambulating and tolerating a regular diet. Pain well-controlled.  Objective: Blood pressure 126/80, pulse 64, temperature 98.5 F (36.9 C), temperature source Oral, resp. rate 18, height 5\' 9"  (1.753 m), weight 95 kg, SpO2 100 %, unknown if currently breastfeeding.  Physical Exam:  General: alert, cooperative and appears stated age 23: appropriate Uterine Fundus: firm DVT Evaluation: No evidence of DVT seen on physical exam.  Recent Labs    10/18/18 1730  HGB 12.2  HCT 36.1    Assessment/Plan: Social Work consult; awaiting recs Possible DC today pending SW recs Formula feeding Interval BTL when Medicaid approved and paperwork signed TOC outpatient for trich and CT Will start Amlodipine for BP's (previously on HCTZ - taking intermittently)   LOS: 2 days   Chauncey Mann 10/20/2018, 5:42 AM

## 2018-10-24 ENCOUNTER — Encounter: Payer: PRIVATE HEALTH INSURANCE | Admitting: Obstetrics and Gynecology

## 2018-10-24 LAB — CERVICOVAGINAL ANCILLARY ONLY
Chlamydia: NEGATIVE
Comment: NEGATIVE
Comment: NORMAL
Neisseria Gonorrhea: NEGATIVE

## 2018-10-28 ENCOUNTER — Ambulatory Visit: Payer: Self-pay

## 2018-10-31 ENCOUNTER — Encounter: Payer: PRIVATE HEALTH INSURANCE | Admitting: Family Medicine

## 2018-11-07 ENCOUNTER — Encounter: Payer: PRIVATE HEALTH INSURANCE | Admitting: Family Medicine

## 2018-11-22 ENCOUNTER — Ambulatory Visit: Payer: Self-pay | Admitting: Advanced Practice Midwife

## 2018-11-22 ENCOUNTER — Encounter: Payer: Self-pay | Admitting: Obstetrics & Gynecology

## 2018-12-20 ENCOUNTER — Encounter: Payer: Self-pay | Admitting: General Practice

## 2020-01-25 ENCOUNTER — Other Ambulatory Visit: Payer: Self-pay

## 2020-01-25 ENCOUNTER — Emergency Department: Payer: Medicaid Other

## 2020-01-25 ENCOUNTER — Encounter: Payer: Self-pay | Admitting: Medical Oncology

## 2020-01-25 ENCOUNTER — Emergency Department
Admission: EM | Admit: 2020-01-25 | Discharge: 2020-01-25 | Disposition: A | Discharge status: Home / Self Care | Payer: Medicaid Other | Attending: Emergency Medicine | Admitting: Emergency Medicine

## 2020-01-25 DIAGNOSIS — F17210 Nicotine dependence, cigarettes, uncomplicated: Secondary | ICD-10-CM | POA: Insufficient documentation

## 2020-01-25 DIAGNOSIS — N939 Abnormal uterine and vaginal bleeding, unspecified: Secondary | ICD-10-CM | POA: Insufficient documentation

## 2020-01-25 DIAGNOSIS — I10 Essential (primary) hypertension: Secondary | ICD-10-CM | POA: Insufficient documentation

## 2020-01-25 DIAGNOSIS — Z7982 Long term (current) use of aspirin: Secondary | ICD-10-CM | POA: Insufficient documentation

## 2020-01-25 DIAGNOSIS — Z20822 Contact with and (suspected) exposure to covid-19: Secondary | ICD-10-CM | POA: Insufficient documentation

## 2020-01-25 DIAGNOSIS — Z79899 Other long term (current) drug therapy: Secondary | ICD-10-CM | POA: Diagnosis not present

## 2020-01-25 DIAGNOSIS — R4182 Altered mental status, unspecified: Secondary | ICD-10-CM | POA: Diagnosis present

## 2020-01-25 LAB — TROPONIN I (HIGH SENSITIVITY): Troponin I (High Sensitivity): <2 ng/L (ref <18)

## 2020-01-25 LAB — CBC WITH DIFFERENTIAL/PLATELET
Abs Immature Granulocytes: 0.06 10*3/uL (ref 0.00–0.07)
Basophils Absolute: 0 10*3/uL (ref 0.0–0.1)
Basophils Relative: 0 %
Eosinophils Absolute: 0.1 10*3/uL (ref 0.0–0.5)
Eosinophils Relative: 1 %
HCT: 35.1 % — ABNORMAL LOW (ref 36.0–46.0)
Hemoglobin: 11.8 g/dL — ABNORMAL LOW (ref 12.0–15.0)
Immature Granulocytes: 1 %
Lymphocytes Relative: 15 %
Lymphs Abs: 1.6 10*3/uL (ref 0.7–4.0)
MCH: 29.8 pg (ref 26.0–34.0)
MCHC: 33.6 g/dL (ref 30.0–36.0)
MCV: 88.6 fL (ref 80.0–100.0)
Monocytes Absolute: 0.7 10*3/uL (ref 0.1–1.0)
Monocytes Relative: 6 %
Neutro Abs: 8.4 10*3/uL — ABNORMAL HIGH (ref 1.7–7.7)
Neutrophils Relative %: 77 %
Platelets: 191 10*3/uL (ref 150–400)
RBC: 3.96 MIL/uL (ref 3.87–5.11)
RDW: 13.1 % (ref 11.5–15.5)
WBC: 10.9 10*3/uL — ABNORMAL HIGH (ref 4.0–10.5)
nRBC: 0 % (ref 0.0–0.2)

## 2020-01-25 LAB — COMPREHENSIVE METABOLIC PANEL
ALT: 19 U/L (ref 0–44)
AST: 17 U/L (ref 15–41)
Albumin: 3.2 g/dL — ABNORMAL LOW (ref 3.5–5.0)
Alkaline Phosphatase: 48 U/L (ref 38–126)
Anion gap: 7 (ref 5–15)
BUN: 13 mg/dL (ref 6–20)
CO2: 24 mmol/L (ref 22–32)
Calcium: 7.9 mg/dL — ABNORMAL LOW (ref 8.9–10.3)
Chloride: 109 mmol/L (ref 98–111)
Creatinine, Ser: 0.55 mg/dL (ref 0.44–1.00)
GFR, Estimated: >60 mL/min (ref >60)
Glucose, Bld: 94 mg/dL (ref 70–99)
Potassium: 4 mmol/L (ref 3.5–5.1)
Sodium: 140 mmol/L (ref 135–145)
Total Bilirubin: 0.7 mg/dL (ref 0.3–1.2)
Total Protein: 5.5 g/dL — ABNORMAL LOW (ref 6.5–8.1)

## 2020-01-25 LAB — WET PREP, GENITAL
Clue Cells Wet Prep HPF POC: NONE SEEN
Sperm: NONE SEEN
Trich, Wet Prep: NONE SEEN
Yeast Wet Prep HPF POC: NONE SEEN

## 2020-01-25 LAB — SARS CORONAVIRUS 2 BY RT PCR (HOSPITAL ORDER, PERFORMED IN ~~LOC~~ HOSPITAL LAB): SARS Coronavirus 2: NEGATIVE

## 2020-01-25 LAB — CHLAMYDIA/NGC RT PCR (ARMC ONLY)
Chlamydia Tr: NOT DETECTED
N gonorrhoeae: NOT DETECTED

## 2020-01-25 LAB — HCG, QUANTITATIVE, PREGNANCY: hCG, Beta Chain, Quant, S: 3 m[IU]/mL (ref <5)

## 2020-01-25 MED ORDER — DOXYCYCLINE HYCLATE 100 MG PO CAPS: 100.0000 mg | ORAL_CAPSULE | Freq: Two times a day (BID) | ORAL | 0 refills | Status: AC

## 2020-01-25 MED ORDER — SODIUM CHLORIDE 0.9 % IV BOLUS
1000.0000 mL | Freq: Once | INTRAVENOUS | Status: AC
  Administered 2020-01-25: 1000 mL via INTRAVENOUS

## 2020-01-25 MED ORDER — MEDROXYPROGESTERONE ACETATE 10 MG PO TABS: 20.0000 mg | ORAL_TABLET | Freq: Every day | ORAL | 0 refills | Status: DC

## 2020-01-25 NOTE — ED Provider Notes (Signed)
[facility] Emergency Department Provider Note   ____________________________________________   Event Date/Time   First MD Initiated Contact with Patient 01/25/20 1407     (approximate)  I have reviewed the triage vital signs and the nursing notes.   HISTORY  Chief Complaint Seizures    HPI Patricia BrakemanSativa Warren is a 25 y.o. female with a stated past medical history of hypertension who presents via EMS after an episode of altered mental status that was witnessed by a coworker.  Patient was told that she was driving out of her facility where she donates plasma today when she began feeling very hot and does not member anything except waking back up in the parking lot.  Patient's coworker told her that she lost consciousness and began having some shaking activity that lasted less than 2 minutes.  Patient returned to baseline in less than 2 minutes as well.  Patient denies any head trauma.  Patient denies any chest pain or shortness of breath preceding this episode.  Patient denies ever having similar symptoms in the past.  Of note, patient states that she has been having intermittent vaginal bleeding that occurs after any episode of sexual intercourse and last for approximately 2 days before resolving spontaneously.  Patient states that it is only spotting and uses 2 pads a day.  Patient is concerned as she has been having this bleeding since last October when she had a medically assisted abortion.  Patient currently denies any vision changes, tinnitus, difficulty speaking, facial droop, sore throat, chest pain, shortness of breath, abdominal pain, nausea/vomiting/diarrhea, dysuria, or weakness/numbness/paresthesias in any extremity         Past Medical History:  Diagnosis Date  . Chlamydia infection affecting pregnancy in first trimester, antepartum   . Hypertension   . Mild obesity   . Smoker   . Trichimoniasis     Patient Active Problem List   Diagnosis Date  Noted  . Chronic hypertension in pregnancy 10/18/2018  . Hypokalemia 10/12/2018  . Short interval between pregnancies affecting pregnancy in third trimester, antepartum 10/03/2018  . Late prenatal care 10/03/2018  . Supervision of high risk pregnancy, antepartum 09/08/2018  . Chronic hypertension 05/24/2017  . Family history of bipolar disorder 02/25/2017  . Chlamydia infection affecting pregnancy in second trimester 02/01/2017  . Insufficient prenatal care in third trimester 05/30/2014  . Marijuana use 03/20/2014  . Smoker     Past Surgical History:  Procedure Laterality Date  . WISDOM TOOTH EXTRACTION     2012    Prior to Admission medications   Medication Sig Start Date End Date Taking? Authorizing Provider  doxycycline (VIBRAMYCIN) 100 MG capsule Take 1 capsule (100 mg total) by mouth 2 (two) times daily for 7 days. 01/25/20 02/01/20 Yes Chesley NoonJessup, Charles, MD  medroxyPROGESTERone (PROVERA) 10 MG tablet Take 2 tablets (20 mg total) by mouth daily for 10 days. 01/25/20 02/04/20 Yes Chesley NoonJessup, Charles, MD  acetaminophen (TYLENOL) 325 MG tablet Take 2 tablets (650 mg total) by mouth every 4 (four) hours as needed (for pain scale < 4). 10/20/18   Venora MaplesEckstat, Matthew M, MD  amLODipine (NORVASC) 5 MG tablet Take 1 tablet (5 mg total) by mouth daily. 10/21/18   Venora MaplesEckstat, Matthew M, MD  aspirin EC 81 MG tablet Take 1 tablet (81 mg total) by mouth daily. Take after 12 weeks for prevention of preeclampsia later in pregnancy Patient not taking: Reported on 09/30/2018 09/14/18   Levie HeritageStinson, Jacob J, DO  ibuprofen (ADVIL) 600 MG tablet  Take 1 tablet (600 mg total) by mouth every 6 (six) hours. 10/20/18   Venora Maples, MD  naproxen sodium (ALEVE) 220 MG tablet Take 220 mg by mouth as needed (headache).    [provider]  Prenatal Vit-Fe Fumarate-FA (PRENATAL VITAMINS) 28-0.8 MG TABS Take 28 mg by mouth daily with breakfast. 03/14/14   Joanna Puff, MD    Allergies Patient has no known  allergies.  Family History  Problem Relation Age of Onset  . Hypertension Mother   . Asthma Brother     Social History Social History   Tobacco Use  . Smoking status: Current Every Day Smoker    Packs/day: 0.50    Types: Cigarettes  . Smokeless tobacco: Never Used  Vaping Use  . Vaping Use: Never used  Substance Use Topics  . Alcohol use: Not Currently    Alcohol/week: 1.0 standard drink    Types: 1 Glasses of wine per week    Comment: early April 2019  . Drug use: No    Types: Marijuana    Comment: current    Review of Systems Constitutional: No fever/chills Eyes: No visual changes. ENT: No sore throat. Cardiovascular: Denies chest pain. Respiratory: Denies shortness of breath. Gastrointestinal: No abdominal pain.  No nausea, no vomiting.  No diarrhea. Genitourinary: Negative for dysuria. Musculoskeletal: Negative for acute arthralgias Skin: Negative for rash. Neurological: Negative for headaches, weakness/numbness/paresthesias in any extremity Psychiatric: Negative for suicidal ideation/homicidal ideation   ____________________________________________   PHYSICAL EXAM:  VITAL SIGNS: ED Triage Vitals [01/25/20 1422]  Enc Vitals Group     BP 116/63     Pulse Rate 75     Resp 20     Temp 98 F (36.7 C)     Temp Source Oral     SpO2 100 %     Weight      Height      Head Circumference      Peak Flow      Pain Score      Pain Loc      Pain Edu?      Excl. in GC?    Constitutional: Alert and oriented. Well appearing and in no acute distress. Eyes: Conjunctivae are normal. PERRL. Head: Atraumatic. Nose: No congestion/rhinnorhea. Mouth/Throat: Mucous membranes are moist. Neck: No stridor Cardiovascular: Grossly normal heart sounds.  Good peripheral circulation. Respiratory: Normal respiratory effort.  No retractions. Gastrointestinal: Soft and nontender. No distention. Musculoskeletal: No obvious deformities Neurologic:  Normal speech and language.  No gross focal neurologic deficits are appreciated. Skin:  Skin is warm and dry. No rash noted. Psychiatric: Mood and affect are normal. Speech and behavior are normal.  ____________________________________________   LABS (all labs ordered are listed, but only abnormal results are displayed)  Labs Reviewed  WET PREP, GENITAL - Abnormal; Notable for the following components:      Result Value   WBC, Wet Prep HPF POC FEW (*)    All other components within normal limits  COMPREHENSIVE METABOLIC PANEL - Abnormal; Notable for the following components:   Calcium 7.9 (*)    Total Protein 5.5 (*)    Albumin 3.2 (*)    All other components within normal limits  CBC WITH DIFFERENTIAL/PLATELET - Abnormal; Notable for the following components:   WBC 10.9 (*)    Hemoglobin 11.8 (*)    HCT 35.1 (*)    Neutro Abs 8.4 (*)    All other components within normal limits  SARS CORONAVIRUS 2  BY RT PCR (HOSPITAL ORDER, PERFORMED IN [place] [facility])  CHLAMYDIA/NGC RT PCR ([facility] ONLY)  HCG, QUANTITATIVE, PREGNANCY  TROPONIN I (HIGH SENSITIVITY)   ____________________________________________  EKG  ED ECG REPORT I, Merwyn Katos, the attending physician, personally viewed and interpreted this ECG.  Date: 01/25/2020 EKG Time: 1547 Rate: 64 Rhythm: normal sinus rhythm QRS Axis: normal Intervals: normal ST/T Wave abnormalities: normal Narrative Interpretation: no evidence of acute ischemia  ____________________________________________  RADIOLOGY  ED MD interpretation: Transabdominal and transvaginal ultrasound of the pelvis shows large blood clot seen in the region of the cervix without any other evidence of abnormalities  Official radiology report(s): US PELVIC COMPLETE WITH TRANSVAGINAL  Result Date: 01/25/2020 CLINICAL DATA:  Vaginal bleeding. EXAM: TRANSABDOMINAL AND TRANSVAGINAL ULTRASOUND OF PELVIS TECHNIQUE: Both transabdominal and transvaginal ultrasound  examinations of the pelvis were performed. Transabdominal technique was performed for global imaging of the pelvis including uterus, ovaries, adnexal regions, and pelvic cul-de-sac. It was necessary to proceed with endovaginal exam following the transabdominal exam to visualize the endometrium and ovaries. COMPARISON:  None FINDINGS: Uterus Measurements: 8.2 x 5.4 x 3.9 cm = volume: 90 mL. No fibroids or other mass visualized. Possible large blood clot seen in the region of the cervix. Endometrium Thickness: 10 mm which is within normal limits for patient of reproductive age. No focal abnormality visualized. Right ovary Measurements: 2.9 x 2.4 x 1.9 cm = volume: 5 mL. Normal appearance/no adnexal mass. Left ovary Measurements: 2.8 x 1.9 x 2.2 cm = volume: 6 mL. Normal appearance/no adnexal mass. Other findings Small amount of free fluid is noted which most likely is physiologic. IMPRESSION: Possible large blood clot seen in the region of the cervix. No other abnormality seen in the pelvis. Electronically Signed   By: Lupita Raider M.D.   On: 01/25/2020 16:01    ____________________________________________   PROCEDURES  Procedure(s) performed (including Critical Care):  Procedures   ____________________________________________   INITIAL IMPRESSION / ASSESSMENT AND PLAN / ED COURSE  As part of my medical decision making, I reviewed the following data within the electronic MEDICAL RECORD NUMBER Nursing notes reviewed and incorporated, Old chart reviewed, and Notes from prior ED visits reviewed and incorporated        Patient presents with complaints of syncope/presyncope ED Workup:  CBC, BMP, Troponin, BNP, ECG, CXR Differential diagnosis includes HF, ICH, seizure, stroke, HOCM, ACS, aortic dissection, malignant arrhythmia, or GI bleed. Findings: No evidence of acute laboratory abnormalities.  Troponin negative x1 EKG: No e/o STEMI. No evidence of Brugadas sign, delta wave, epsilon wave,  significantly prolonged QTc, or malignant arrhythmia. Given additional history of vaginal bleeding, patient will require an ultrasound of the pelvis Disposition: Care of this patient will be signed out to the oncoming physician at the end of my shift.  All pertinent patient information conveyed and all questions answered.  All further care and disposition decisions will be made by the oncoming physician.      ____________________________________________   FINAL CLINICAL IMPRESSION(S) / ED DIAGNOSES  Final diagnoses:  Vaginal bleeding     ED Discharge Orders         Ordered    doxycycline (VIBRAMYCIN) 100 MG capsule  2 times daily        01/25/20 1829    medroxyPROGESTERone (PROVERA) 10 MG tablet  Daily        01/25/20 1829           Note:  This document was prepared using Dragon  voice recognition software and may include unintentional dictation errors.   Merwyn Katos, MD 01/26/20 [phone]

## 2020-01-25 NOTE — ED Provider Notes (Signed)
-----------------------------------------   3:09 PM on 01/25/2020 -----------------------------------------  Blood pressure 116/63, pulse 75, temperature 98 F (36.7 C), temperature source Oral, resp. rate 20, height 5\' 9"  (1.753 m), weight 95 kg, SpO2 100 %, unknown if currently breastfeeding.  Assuming care from Dr. .  In short, Patricia Warren is a 25 y.o. female with a chief complaint of Seizures .  Refer to the original H&P for additional details.  The current plan of care is to follow-up labs and 30 following intermittent vaginal bleeding following elective abortion last October. Patient with syncopal episode today after donating plasma, seizure thought to be unlikely.  ----------------------------------------- 6:30 PM on 01/25/2020 -----------------------------------------  Ultrasound shows large clot at the area of patient's cervix but is otherwise unremarkable.  Pregnancy testing is negative.  H&H is stable from previous and patient remains hemodynamically stable.  On pelvic exam patient passed a large clot but afterwards had minimal bleeding.  Case discussed with Dr. 01/27/2020 from OB/GYN, who recommends 7-day course of doxycycline as well as starting patient on 10 days of Provera.  He will be able to follow-up the patient in the clinic.  Patient remains hemodynamically stable with minimal bleeding at this time and is appropriate for discharge home.  She was counseled to return to the ED for new or worsening symptoms, patient agrees with plan.    Tiburcio Pea, MD 01/25/20 [phone]

## 2020-01-25 NOTE — ED Triage Notes (Signed)
Pt donated plasma earlier, when she was riding with friend she began having some seizure like activity that lasted approx 30 sec. No hx of seizures, pt arrives postictal.

## 2020-05-02 ENCOUNTER — Encounter (HOSPITAL_COMMUNITY): Payer: Self-pay

## 2020-05-02 ENCOUNTER — Ambulatory Visit (HOSPITAL_COMMUNITY)
Admission: EM | Admit: 2020-05-02 | Discharge: 2020-05-02 | Disposition: A | Discharge status: Home / Self Care | Payer: Medicaid Other | Attending: Emergency Medicine | Admitting: Emergency Medicine

## 2020-05-02 ENCOUNTER — Other Ambulatory Visit: Payer: Self-pay

## 2020-05-02 DIAGNOSIS — N898 Other specified noninflammatory disorders of vagina: Secondary | ICD-10-CM | POA: Diagnosis not present

## 2020-05-02 LAB — POCT URINALYSIS DIPSTICK, ED / UC
Bilirubin Urine: NEGATIVE
Glucose, UA: NEGATIVE mg/dL
Hgb urine dipstick: NEGATIVE
Ketones, ur: NEGATIVE mg/dL
Nitrite: NEGATIVE
Protein, ur: NEGATIVE mg/dL
Specific Gravity, Urine: 1.015 (ref 1.005–1.030)
Urobilinogen, UA: 0.2 mg/dL (ref 0.0–1.0)
pH: 7 (ref 5.0–8.0)

## 2020-05-02 LAB — CERVICOVAGINAL ANCILLARY ONLY
Bacterial Vaginitis (gardnerella): NEGATIVE
Candida Glabrata: NEGATIVE
Candida Vaginitis: POSITIVE — AB
Chlamydia: NEGATIVE
Comment: NEGATIVE
Comment: NEGATIVE
Comment: NEGATIVE
Comment: NEGATIVE
Comment: NEGATIVE
Comment: NORMAL
Neisseria Gonorrhea: NEGATIVE
Trichomonas: NEGATIVE

## 2020-05-02 NOTE — Discharge Instructions (Addendum)
Labs pending 2 to 3 days, you will be notified for any positive results so you may receive treatment  Please refrain from sex while labs are pending, if positive please refrain from sex until treatment complete, if positive please notify partner so that they may also be treated

## 2020-05-02 NOTE — ED Triage Notes (Signed)
Pt c/o vaginal discomfort, discharge and itching x 4 days.  She states she has tried Monistat and has had no relief.

## 2020-05-02 NOTE — ED Provider Notes (Signed)
[facility]    CSN: [id number] Arrival date & time: 05/02/20  3903      History   Chief Complaint Chief Complaint  Patient presents with  . Vaginal Discharge  . Vaginal Itching    HPI Patricia Warren is a 25 y.o. female.   Patient presents with vaginal irritation, itching, yellow discharge, mild odor and urinary frequency for 4 days. denies Hematuria, abdominal pain, flank pain.  Attempted use of over-the-counter antifungal medication and boric acid suppositories with no relief.  1 partner , no condom use.  Past Medical History:  Diagnosis Date  . Chlamydia infection affecting pregnancy in first trimester, antepartum   . Hypertension   . Mild obesity   . Smoker   . Trichimoniasis     Patient Active Problem List   Diagnosis Date Noted  . Chronic hypertension in pregnancy 10/18/2018  . Hypokalemia 10/12/2018  . Short interval between pregnancies affecting pregnancy in third trimester, antepartum 10/03/2018  . Late prenatal care 10/03/2018  . Supervision of high risk pregnancy, antepartum 09/08/2018  . Chronic hypertension 05/24/2017  . Family history of bipolar disorder 02/25/2017  . Chlamydia infection affecting pregnancy in second trimester 02/01/2017  . Insufficient prenatal care in third trimester 05/30/2014  . Marijuana use 03/20/2014  . Smoker     Past Surgical History:  Procedure Laterality Date  . WISDOM TOOTH EXTRACTION     2012    OB History    Gravida  4   Para  3   Term  3   Preterm      AB  1   Living  3     SAB      IAB      Ectopic      Multiple  0   Live Births  3            Home Medications    Prior to Admission medications   Medication Sig Start Date End Date Taking? Authorizing Provider  acetaminophen (TYLENOL) 325 MG tablet Take 2 tablets (650 mg total) by mouth every 4 (four) hours as needed (for pain scale < 4). 10/20/18   Venora Maples, MD  amLODipine (NORVASC) 5 MG tablet Take 1 tablet (5 mg  total) by mouth daily. 10/21/18   Venora Maples, MD  aspirin EC 81 MG tablet Take 1 tablet (81 mg total) by mouth daily. Take after 12 weeks for prevention of preeclampsia later in pregnancy Patient not taking: Reported on 09/30/2018 09/14/18   Levie Heritage, DO  ibuprofen (ADVIL) 600 MG tablet Take 1 tablet (600 mg total) by mouth every 6 (six) hours. 10/20/18   Venora Maples, MD  medroxyPROGESTERone (PROVERA) 10 MG tablet Take 2 tablets (20 mg total) by mouth daily for 10 days. 01/25/20 02/04/20  Chesley Noon, MD  naproxen sodium (ALEVE) 220 MG tablet Take 220 mg by mouth as needed (headache).    [provider]  Prenatal Vit-Fe Fumarate-FA (PRENATAL VITAMINS) 28-0.8 MG TABS Take 28 mg by mouth daily with breakfast. 03/14/14   Joanna Puff, MD    Family History Family History  Problem Relation Age of Onset  . Hypertension Mother   . Asthma Brother     Social History Social History   Tobacco Use  . Smoking status: Current Every Day Smoker    Packs/day: 0.50    Types: Cigarettes  . Smokeless tobacco: Never Used  Vaping Use  . Vaping Use: Never used  Substance Use Topics  .  Alcohol use: Not Currently    Alcohol/week: 1.0 standard drink    Types: 1 Glasses of wine per week    Comment: early April 2019  . Drug use: No    Types: Marijuana    Comment: current     Allergies   Patient has no known allergies.   Review of Systems Review of Systems  Constitutional: Negative.   Respiratory: Negative.   Cardiovascular: Negative.   Genitourinary: Positive for frequency and vaginal discharge. Negative for decreased urine volume, difficulty urinating, dyspareunia, dysuria, enuresis, flank pain, genital sores, hematuria, menstrual problem, pelvic pain, urgency, vaginal bleeding and vaginal pain.  Skin: Negative.   Neurological: Negative.      Physical Exam Triage Vital Signs ED Triage Vitals  Enc Vitals Group     BP 05/02/20 0926 125/79     Pulse Rate  05/02/20 0926 66     Resp 05/02/20 0926 18     Temp 05/02/20 0926 98.1 F (36.7 C)     Temp Source 05/02/20 0926 Oral     SpO2 05/02/20 0926 100 %     Weight --      Height --      Head Circumference --      Peak Flow --      Pain Score 05/02/20 0924 0     Pain Loc --      Pain Edu? --      Excl. in GC? --    No data found.  Updated Vital Signs BP 125/79 (BP Location: Right Arm)   Pulse 66   Temp 98.1 F (36.7 C) (Oral)   Resp 18   LMP 04/20/2020 (Approximate)   SpO2 100%   Visual Acuity Right Eye Distance:   Left Eye Distance:   Bilateral Distance:    Right Eye Near:   Left Eye Near:    Bilateral Near:     Physical Exam Constitutional:      Appearance: Normal appearance. She is normal weight.  HENT:     Head: Normocephalic.  Eyes:     Extraocular Movements: Extraocular movements intact.  Pulmonary:     Effort: Pulmonary effort is normal.  Musculoskeletal:        General: Normal range of motion.     Cervical back: Normal range of motion.  Skin:    General: Skin is warm and dry.  Neurological:     Mental Status: She is alert and oriented to person, place, and time. Mental status is at baseline.  Psychiatric:        Mood and Affect: Mood normal.        Behavior: Behavior normal.        Thought Content: Thought content normal.        Judgment: Judgment normal.      UC Treatments / Results  Labs (all labs ordered are listed, but only abnormal results are displayed) Labs Reviewed  CERVICOVAGINAL ANCILLARY ONLY    EKG   Radiology No results found.  Procedures Procedures (including critical care time)  Medications Ordered in UC Medications - No data to display  Initial Impression / Assessment and Plan / UC Course  I have reviewed the triage vital signs and the nursing notes.  Pertinent labs & imaging results that were available during my care of the patient were reviewed by me and considered in my medical decision making (see chart for  details).  Vaginal itching  1. Urinalysis- negative  2. sti screen- pending, will treat per  protocol, needs nausea medications prescribed with antibiotics.  3. Advised abstinence until labs result and/or treatment complete  Final Clinical Impressions(s) / UC Diagnoses   Final diagnoses:  None   Discharge Instructions   None    ED Prescriptions    None     PDMP not reviewed this encounter.   Valinda Hoar, [place] 05/02/20 [phone]

## 2020-05-03 ENCOUNTER — Telehealth: Payer: Self-pay

## 2020-05-03 MED ORDER — FLUCONAZOLE 150 MG PO TABS: 150.0000 mg | ORAL_TABLET | Freq: Every day | ORAL | 0 refills | Status: AC

## 2020-11-15 ENCOUNTER — Emergency Department (HOSPITAL_COMMUNITY)
Admission: EM | Admit: 2020-11-15 | Discharge: 2020-11-15 | Disposition: A | Discharge status: Home / Self Care | Payer: Medicaid Other | Attending: Emergency Medicine | Admitting: Emergency Medicine

## 2020-11-15 ENCOUNTER — Encounter (HOSPITAL_COMMUNITY): Payer: Self-pay | Admitting: Emergency Medicine

## 2020-11-15 ENCOUNTER — Other Ambulatory Visit: Payer: Self-pay

## 2020-11-15 DIAGNOSIS — O26899 Other specified pregnancy related conditions, unspecified trimester: Secondary | ICD-10-CM | POA: Insufficient documentation

## 2020-11-15 DIAGNOSIS — Z5321 Procedure and treatment not carried out due to patient leaving prior to being seen by health care provider: Secondary | ICD-10-CM | POA: Diagnosis not present

## 2020-11-15 DIAGNOSIS — R55 Syncope and collapse: Secondary | ICD-10-CM | POA: Insufficient documentation

## 2020-11-15 LAB — BASIC METABOLIC PANEL
Anion gap: 8 (ref 5–15)
BUN: 5 mg/dL — ABNORMAL LOW (ref 6–20)
CO2: 23 mmol/L (ref 22–32)
Calcium: 8.6 mg/dL — ABNORMAL LOW (ref 8.9–10.3)
Chloride: 103 mmol/L (ref 98–111)
Creatinine, Ser: 0.45 mg/dL (ref 0.44–1.00)
GFR, Estimated: >60 mL/min (ref >60)
Glucose, Bld: 94 mg/dL (ref 70–99)
Potassium: 3.7 mmol/L (ref 3.5–5.1)
Sodium: 134 mmol/L — ABNORMAL LOW (ref 135–145)

## 2020-11-15 LAB — CBC WITH DIFFERENTIAL/PLATELET
Abs Immature Granulocytes: 0.12 10*3/uL — ABNORMAL HIGH (ref 0.00–0.07)
Basophils Absolute: 0 10*3/uL (ref 0.0–0.1)
Basophils Relative: 0 %
Eosinophils Absolute: 0.1 10*3/uL (ref 0.0–0.5)
Eosinophils Relative: 1 %
HCT: 32.6 % — ABNORMAL LOW (ref 36.0–46.0)
Hemoglobin: 11.3 g/dL — ABNORMAL LOW (ref 12.0–15.0)
Immature Granulocytes: 1 %
Lymphocytes Relative: 16 %
Lymphs Abs: 1.7 10*3/uL (ref 0.7–4.0)
MCH: 30.2 pg (ref 26.0–34.0)
MCHC: 34.7 g/dL (ref 30.0–36.0)
MCV: 87.2 fL (ref 80.0–100.0)
Monocytes Absolute: 0.8 10*3/uL (ref 0.1–1.0)
Monocytes Relative: 8 %
Neutro Abs: 8 10*3/uL — ABNORMAL HIGH (ref 1.7–7.7)
Neutrophils Relative %: 74 %
Platelets: 235 10*3/uL (ref 150–400)
RBC: 3.74 MIL/uL — ABNORMAL LOW (ref 3.87–5.11)
RDW: 13.5 % (ref 11.5–15.5)
WBC: 10.7 10*3/uL — ABNORMAL HIGH (ref 4.0–10.5)
nRBC: 0 % (ref 0.0–0.2)

## 2020-11-15 LAB — URINALYSIS, ROUTINE W REFLEX MICROSCOPIC
Bilirubin Urine: NEGATIVE
Glucose, UA: NEGATIVE mg/dL
Hgb urine dipstick: NEGATIVE
Ketones, ur: NEGATIVE mg/dL
Leukocytes,Ua: NEGATIVE
Nitrite: NEGATIVE
Protein, ur: NEGATIVE mg/dL
Specific Gravity, Urine: 1.02 (ref 1.005–1.030)
pH: 7 (ref 5.0–8.0)

## 2020-11-15 NOTE — ED Notes (Signed)
Called pt 3x, no response.  

## 2020-11-15 NOTE — ED Provider Notes (Addendum)
Emergency Medicine Provider Triage Evaluation Note  Patricia Warren , a 25 y.o. female  was evaluated in triage.  Pt complains of syncope last night.  Went straight to sleep after this.  Has "not felt right since."  Pregnant.  Review of Systems  Positive: Syncope, weakness, dizziness Negative: Abdominal pain, cramping, bleeding  Physical Exam  BP (!) 146/81 (BP Location: Right Arm)   Pulse 84   Temp 98.1 F (36.7 C) (Oral)   Resp 20   LMP  (Approximate)   SpO2 100%  Gen:   Awake, no distress   Resp:  Normal effort  MSK:   Moves extremities without difficulty  Other:  No abdominal tenderness.  Congestion noted.  Lung sounds clear.  Medical Decision Making  Medically screening exam initiated at 12:19 PM.  Appropriate orders placed.  Patricia Warren was informed that the remainder of the evaluation will be completed by another provider, this initial triage assessment does not replace that evaluation, and the importance of remaining in the ED until their evaluation is complete.   I spoke with MAU who agrees that this is a medical problem and needs to be worked up in the emergency department.    Saddie Benders, PA-C 11/15/20 1221    Saddie Benders, PA-C 11/15/20 1225    Arby Barrette, MD 11/16/20 [phone]

## 2020-11-15 NOTE — ED Notes (Signed)
Called pt for vitals, no response.

## 2020-11-15 NOTE — ED Triage Notes (Signed)
Pt reports getting out of bed to use the bathroom last night, got dizzy while walking, had syncopal episode and fell on the ground. Brief LOC, was able to walk back to bed. Pt feeling "groggy" since then. Having congestion and cough x 1 week. Pt [redacted] weeks pregnant-EDD 03/2021 (does not remember exact day).

## 2021-01-05 NOTE — L&D Delivery Note (Addendum)
OB/GYN Faculty Practice Delivery Note ? ?Patricia Warren is a 26 y.o. [id number] s/p SVD at [redacted]w[redacted]d. She was admitted for IOL due to cHTN with SIPE with severe features.  ? ?ROM: 0h 24m with clear fluid ?GBS Status: Unknown; PCN given  ? ?Delivery Date/Time: 03/06/21 at 1622 ? ?Delivery: Called to room and patient was complete and pushing. Head delivered LOA. A single loose nuchal cord was present and easily reduced at the perineum. Shoulder and body delivered in usual fashion. Infant with spontaneous cry, placed on mother's abdomen, dried and stimulated. Cord clamped x 2 after 1-minute delay and cut by support friend under direct supervision. Cord blood drawn. Fundus firm with massage and Pitocin. Placenta not delivered after 30 minutes despite intermittent massage and gentle cord traction. Dr. Macon Large called to bedside for assessment. Placenta manually extracted. Following extraction, uterus palpably smooth with no obvious retained products. Administered 2g of prophylactic IV Ancef. Labia, perineum, vagina, and cervix were inspected, no lacerations sustained.  ? ?Placenta: Intact, 3VC - sent to L&D ?Complications: None ?Lacerations: None ?EBL: 75 mL ?Analgesia: Epidural ? ?Infant: Viable female  APGARs 9 and 9  weight pending ? ?Anselm Pancoast, DO PGY-1 ?03/06/2021, 5:20 PM ? ?GME ATTESTATION:  ?I saw and evaluated the patient. I was present, gowned, and gloved for the entire delivery and management of the patient. I agree with the findings and the plan of care as documented in the resident?s note. I have made changes to documentation as necessary. ? ?Evalina Field, MD ?OB Fellow, Faculty Practice ?[place] ?03/06/2021 8:37 PM ? ?

## 2021-02-18 ENCOUNTER — Telehealth: Payer: Medicaid Other

## 2021-02-26 ENCOUNTER — Telehealth: Payer: Medicaid Other

## 2021-02-26 ENCOUNTER — Telehealth: Payer: Self-pay | Admitting: *Deleted

## 2021-02-26 NOTE — Telephone Encounter (Signed)
11:07 Patricia Warren not connected for new ob intake virtual visit. I called her mobile number and left a message I am calling re: her virtual visit today and am ready to start; please join me virtually if you can, If I do not see you have joined in a few minutes; I will call you back.  I also called her home number and heard a message " this caller is not taking calls right  now, please call back". [name]

## 2021-02-26 NOTE — Telephone Encounter (Signed)
11:27 Patricia Warren still not joined virtually. I called mobile number and left a message I am calling again about her virtual visit appointment and since I have not been able to connect virtually or by phone; she will need to call to reschedule. I also called her home number and again hear a message" your call cannot be completed at this time, please call again later".  Will notify registrar. Staci Acosta

## 2021-03-03 ENCOUNTER — Encounter: Payer: Medicaid Other | Admitting: Nurse Practitioner

## 2021-03-06 ENCOUNTER — Inpatient Hospital Stay (HOSPITAL_COMMUNITY): Payer: Medicaid Other | Admitting: Anesthesiology

## 2021-03-06 ENCOUNTER — Inpatient Hospital Stay (HOSPITAL_COMMUNITY)
Admission: AD | Admit: 2021-03-06 | Discharge: 2021-03-08 | DRG: 807 | Disposition: A | Discharge status: Home / Self Care | Payer: Medicaid Other | Attending: Obstetrics & Gynecology | Admitting: Obstetrics & Gynecology

## 2021-03-06 ENCOUNTER — Encounter: Payer: Self-pay | Admitting: Student

## 2021-03-06 ENCOUNTER — Other Ambulatory Visit: Payer: Self-pay

## 2021-03-06 DIAGNOSIS — O99324 Drug use complicating childbirth: Secondary | ICD-10-CM | POA: Diagnosis present

## 2021-03-06 DIAGNOSIS — Z3A37 37 weeks gestation of pregnancy: Secondary | ICD-10-CM

## 2021-03-06 DIAGNOSIS — F1290 Cannabis use, unspecified, uncomplicated: Secondary | ICD-10-CM | POA: Diagnosis present

## 2021-03-06 DIAGNOSIS — O1414 Severe pre-eclampsia complicating childbirth: Secondary | ICD-10-CM | POA: Diagnosis not present

## 2021-03-06 DIAGNOSIS — F1490 Cocaine use, unspecified, uncomplicated: Secondary | ICD-10-CM | POA: Diagnosis present

## 2021-03-06 DIAGNOSIS — F1990 Other psychoactive substance use, unspecified, uncomplicated: Secondary | ICD-10-CM | POA: Diagnosis present

## 2021-03-06 DIAGNOSIS — O119 Pre-existing hypertension with pre-eclampsia, unspecified trimester: Secondary | ICD-10-CM | POA: Diagnosis present

## 2021-03-06 DIAGNOSIS — O114 Pre-existing hypertension with pre-eclampsia, complicating childbirth: Principal | ICD-10-CM | POA: Diagnosis present

## 2021-03-06 DIAGNOSIS — F17210 Nicotine dependence, cigarettes, uncomplicated: Secondary | ICD-10-CM | POA: Diagnosis present

## 2021-03-06 DIAGNOSIS — Z20822 Contact with and (suspected) exposure to covid-19: Secondary | ICD-10-CM | POA: Diagnosis present

## 2021-03-06 DIAGNOSIS — O99334 Smoking (tobacco) complicating childbirth: Secondary | ICD-10-CM | POA: Diagnosis present

## 2021-03-06 DIAGNOSIS — O1002 Pre-existing essential hypertension complicating childbirth: Secondary | ICD-10-CM | POA: Diagnosis present

## 2021-03-06 DIAGNOSIS — O0930 Supervision of pregnancy with insufficient antenatal care, unspecified trimester: Secondary | ICD-10-CM

## 2021-03-06 LAB — COMPREHENSIVE METABOLIC PANEL
ALT: 33 U/L (ref 0–44)
AST: 38 U/L (ref 15–41)
Albumin: 2.5 g/dL — ABNORMAL LOW (ref 3.5–5.0)
Alkaline Phosphatase: 151 U/L — ABNORMAL HIGH (ref 38–126)
Anion gap: 7 (ref 5–15)
BUN: 9 mg/dL (ref 6–20)
CO2: 22 mmol/L (ref 22–32)
Calcium: 8.4 mg/dL — ABNORMAL LOW (ref 8.9–10.3)
Chloride: 107 mmol/L (ref 98–111)
Creatinine, Ser: 0.43 mg/dL — ABNORMAL LOW (ref 0.44–1.00)
GFR, Estimated: >60 mL/min (ref >60)
Glucose, Bld: 97 mg/dL (ref 70–99)
Potassium: 3.6 mmol/L (ref 3.5–5.1)
Sodium: 136 mmol/L (ref 135–145)
Total Bilirubin: 0.2 mg/dL — ABNORMAL LOW (ref 0.3–1.2)
Total Protein: 5.7 g/dL — ABNORMAL LOW (ref 6.5–8.1)

## 2021-03-06 LAB — RAPID URINE DRUG SCREEN, HOSP PERFORMED
Amphetamines: NOT DETECTED
Barbiturates: NOT DETECTED
Benzodiazepines: NOT DETECTED
Cocaine: POSITIVE — AB
Opiates: NOT DETECTED
Tetrahydrocannabinol: POSITIVE — AB

## 2021-03-06 LAB — CBC
HCT: 33.2 % — ABNORMAL LOW (ref 36.0–46.0)
Hemoglobin: 11.3 g/dL — ABNORMAL LOW (ref 12.0–15.0)
MCH: 28.8 pg (ref 26.0–34.0)
MCHC: 34 g/dL (ref 30.0–36.0)
MCV: 84.5 fL (ref 80.0–100.0)
Platelets: 212 10*3/uL (ref 150–400)
RBC: 3.93 MIL/uL (ref 3.87–5.11)
RDW: 13.5 % (ref 11.5–15.5)
WBC: 12 10*3/uL — ABNORMAL HIGH (ref 4.0–10.5)
nRBC: 0 % (ref 0.0–0.2)

## 2021-03-06 LAB — TYPE AND SCREEN
ABO/RH(D): O POS
Antibody Screen: NEGATIVE

## 2021-03-06 LAB — HEPATITIS B SURFACE ANTIGEN: Hepatitis B Surface Ag: NONREACTIVE

## 2021-03-06 LAB — HIV ANTIBODY (ROUTINE TESTING W REFLEX): HIV Screen 4th Generation wRfx: NONREACTIVE

## 2021-03-06 LAB — WET PREP, GENITAL
Clue Cells Wet Prep HPF POC: NONE SEEN
Sperm: NONE SEEN
Trich, Wet Prep: NONE SEEN
WBC, Wet Prep HPF POC: >=10 — AB (ref <10)
Yeast Wet Prep HPF POC: NONE SEEN

## 2021-03-06 LAB — HEMOGLOBIN A1C
Hgb A1c MFr Bld: 4.9 % (ref 4.8–5.6)
Mean Plasma Glucose: 93.93 mg/dL

## 2021-03-06 LAB — PROTEIN / CREATININE RATIO, URINE
Creatinine, Urine: 177.71 mg/dL
Protein Creatinine Ratio: 0.16 mg/mg{Cre} — ABNORMAL HIGH (ref 0.00–0.15)
Total Protein, Urine: 29 mg/dL

## 2021-03-06 LAB — RESP PANEL BY RT-PCR (FLU A&B, COVID) ARPGX2
Influenza A by PCR: NEGATIVE
Influenza B by PCR: NEGATIVE
SARS Coronavirus 2 by RT PCR: NEGATIVE

## 2021-03-06 LAB — HEPATITIS C ANTIBODY: HCV Ab: NONREACTIVE

## 2021-03-06 LAB — RPR: RPR Ser Ql: NONREACTIVE

## 2021-03-06 MED ORDER — OXYTOCIN-SODIUM CHLORIDE 30-0.9 UT/500ML-% IV SOLN
2.5000 [IU]/h | INTRAVENOUS | Status: DC
  Filled 2021-03-06: qty 500

## 2021-03-06 MED ORDER — DIPHENHYDRAMINE HCL 25 MG PO CAPS: 25.0000 mg | ORAL_CAPSULE | Freq: Four times a day (QID) | ORAL | Status: DC | PRN

## 2021-03-06 MED ORDER — LABETALOL HCL 5 MG/ML IV SOLN
20.0000 mg | INTRAVENOUS | Status: DC | PRN
  Administered 2021-03-06 (×2): 20 mg via INTRAVENOUS
  Filled 2021-03-06 (×3): qty 4

## 2021-03-06 MED ORDER — SIMETHICONE 80 MG PO CHEW: 80.0000 mg | CHEWABLE_TABLET | ORAL | Status: DC | PRN

## 2021-03-06 MED ORDER — PRENATAL MULTIVITAMIN CH
1.0000 | ORAL_TABLET | Freq: Every day | ORAL | Status: DC
  Administered 2021-03-07 – 2021-03-08 (×2): 1 via ORAL
  Filled 2021-03-06 (×2): qty 1

## 2021-03-06 MED ORDER — CEFAZOLIN SODIUM-DEXTROSE 2-4 GM/100ML-% IV SOLN
2.0000 g | Freq: Once | INTRAVENOUS | Status: AC
  Administered 2021-03-06: 2 g via INTRAVENOUS
  Filled 2021-03-06: qty 100

## 2021-03-06 MED ORDER — LABETALOL HCL 5 MG/ML IV SOLN: 40.0000 mg | INTRAVENOUS | Status: DC | PRN

## 2021-03-06 MED ORDER — OXYCODONE-ACETAMINOPHEN 5-325 MG PO TABS: 1.0000 | ORAL_TABLET | ORAL | Status: DC | PRN

## 2021-03-06 MED ORDER — NIFEDIPINE ER OSMOTIC RELEASE 30 MG PO TB24
30.0000 mg | ORAL_TABLET | Freq: Every day | ORAL | Status: DC
  Administered 2021-03-06: 30 mg via ORAL
  Filled 2021-03-06: qty 1

## 2021-03-06 MED ORDER — WITCH HAZEL-GLYCERIN EX PADS: 1.0000 "application " | MEDICATED_PAD | CUTANEOUS | Status: DC | PRN

## 2021-03-06 MED ORDER — SODIUM CHLORIDE 0.9 % IV SOLN
5.0000 10*6.[IU] | Freq: Once | INTRAVENOUS | Status: AC
  Administered 2021-03-06: 5 10*6.[IU] via INTRAVENOUS
  Filled 2021-03-06: qty 5

## 2021-03-06 MED ORDER — HYDRALAZINE HCL 20 MG/ML IJ SOLN: 10.0000 mg | INTRAMUSCULAR | Status: DC | PRN

## 2021-03-06 MED ORDER — LACTATED RINGERS IV SOLN
INTRAVENOUS | Status: DC
  Administered 2021-03-07: 50 mL/h via INTRAVENOUS

## 2021-03-06 MED ORDER — LABETALOL HCL 5 MG/ML IV SOLN: 80.0000 mg | INTRAVENOUS | Status: DC | PRN

## 2021-03-06 MED ORDER — LACTATED RINGERS IV SOLN: 500.0000 mL | INTRAVENOUS | Status: DC | PRN

## 2021-03-06 MED ORDER — OXYTOCIN-SODIUM CHLORIDE 30-0.9 UT/500ML-% IV SOLN
1.0000 m[IU]/min | INTRAVENOUS | Status: DC
  Administered 2021-03-06: 2 m[IU]/min via INTRAVENOUS

## 2021-03-06 MED ORDER — PENICILLIN G POT IN DEXTROSE 60000 UNIT/ML IV SOLN
3.0000 10*6.[IU] | INTRAVENOUS | Status: DC
  Administered 2021-03-06 (×2): 3 10*6.[IU] via INTRAVENOUS
  Filled 2021-03-06 (×6): qty 50

## 2021-03-06 MED ORDER — PHENYLEPHRINE 40 MCG/ML (10ML) SYRINGE FOR IV PUSH (FOR BLOOD PRESSURE SUPPORT): 80.0000 ug | PREFILLED_SYRINGE | INTRAVENOUS | Status: DC | PRN

## 2021-03-06 MED ORDER — LACTATED RINGERS IV SOLN
500.0000 mL | Freq: Once | INTRAVENOUS | Status: AC
  Administered 2021-03-06: 500 mL via INTRAVENOUS

## 2021-03-06 MED ORDER — EPHEDRINE 5 MG/ML INJ: 10.0000 mg | INTRAVENOUS | Status: DC | PRN

## 2021-03-06 MED ORDER — FUROSEMIDE 40 MG PO TABS
20.0000 mg | ORAL_TABLET | Freq: Two times a day (BID) | ORAL | Status: DC
  Administered 2021-03-07 – 2021-03-08 (×3): 20 mg via ORAL
  Filled 2021-03-06 (×3): qty 1

## 2021-03-06 MED ORDER — LACTATED RINGERS IV SOLN: INTRAVENOUS | Status: DC

## 2021-03-06 MED ORDER — DIPHENHYDRAMINE HCL 50 MG/ML IJ SOLN
12.5000 mg | INTRAMUSCULAR | Status: AC | PRN
  Administered 2021-03-06 (×3): 12.5 mg via INTRAVENOUS
  Filled 2021-03-06: qty 1

## 2021-03-06 MED ORDER — FENTANYL CITRATE (PF) 100 MCG/2ML IJ SOLN: 50.0000 ug | INTRAMUSCULAR | Status: DC | PRN

## 2021-03-06 MED ORDER — OXYTOCIN BOLUS FROM INFUSION
333.0000 mL | Freq: Once | INTRAVENOUS | Status: AC
  Administered 2021-03-06: 333 mL via INTRAVENOUS

## 2021-03-06 MED ORDER — MAGNESIUM SULFATE BOLUS VIA INFUSION
4.0000 g | Freq: Once | INTRAVENOUS | Status: AC
  Administered 2021-03-06: 4 g via INTRAVENOUS
  Filled 2021-03-06: qty 1000

## 2021-03-06 MED ORDER — TERBUTALINE SULFATE 1 MG/ML IJ SOLN: 0.2500 mg | Freq: Once | INTRAMUSCULAR | Status: DC | PRN

## 2021-03-06 MED ORDER — BENZOCAINE-MENTHOL 20-0.5 % EX AERO
1.0000 "application " | INHALATION_SPRAY | CUTANEOUS | Status: DC | PRN
  Administered 2021-03-06: 1 via TOPICAL
  Filled 2021-03-06: qty 56

## 2021-03-06 MED ORDER — ONDANSETRON HCL 4 MG/2ML IJ SOLN: 4.0000 mg | INTRAMUSCULAR | Status: DC | PRN

## 2021-03-06 MED ORDER — FENTANYL-BUPIVACAINE-NACL 0.5-0.125-0.9 MG/250ML-% EP SOLN
12.0000 mL/h | EPIDURAL | Status: DC | PRN
  Administered 2021-03-06: 12 mL/h via EPIDURAL
  Filled 2021-03-06: qty 250

## 2021-03-06 MED ORDER — LIDOCAINE HCL (PF) 1 % IJ SOLN
INTRAMUSCULAR | Status: DC | PRN
  Administered 2021-03-06: 10 mL via EPIDURAL

## 2021-03-06 MED ORDER — LIDOCAINE HCL (PF) 1 % IJ SOLN: 30.0000 mL | INTRAMUSCULAR | Status: DC | PRN

## 2021-03-06 MED ORDER — ACETAMINOPHEN 325 MG PO TABS
650.0000 mg | ORAL_TABLET | ORAL | Status: DC | PRN
  Administered 2021-03-06: 650 mg via ORAL
  Filled 2021-03-06: qty 2

## 2021-03-06 MED ORDER — IBUPROFEN 600 MG PO TABS
600.0000 mg | ORAL_TABLET | Freq: Four times a day (QID) | ORAL | Status: DC
  Administered 2021-03-06 – 2021-03-08 (×8): 600 mg via ORAL
  Filled 2021-03-06 (×8): qty 1

## 2021-03-06 MED ORDER — MAGNESIUM SULFATE 40 GM/1000ML IV SOLN
2.0000 g/h | INTRAVENOUS | Status: AC
  Administered 2021-03-06: 2 g/h via INTRAVENOUS
  Filled 2021-03-06: qty 1000

## 2021-03-06 MED ORDER — MAGNESIUM SULFATE 40 GM/1000ML IV SOLN
2.0000 g/h | INTRAVENOUS | Status: DC
  Filled 2021-03-06: qty 1000

## 2021-03-06 MED ORDER — SOD CITRATE-CITRIC ACID 500-334 MG/5ML PO SOLN: 30.0000 mL | ORAL | Status: DC | PRN

## 2021-03-06 MED ORDER — COCONUT OIL OIL: 1.0000 "application " | TOPICAL_OIL | Status: DC | PRN

## 2021-03-06 MED ORDER — ONDANSETRON HCL 4 MG PO TABS: 4.0000 mg | ORAL_TABLET | ORAL | Status: DC | PRN

## 2021-03-06 MED ORDER — ACETAMINOPHEN 325 MG PO TABS
650.0000 mg | ORAL_TABLET | ORAL | Status: DC | PRN
  Administered 2021-03-07 (×2): 650 mg via ORAL
  Filled 2021-03-06 (×2): qty 2

## 2021-03-06 MED ORDER — SENNOSIDES-DOCUSATE SODIUM 8.6-50 MG PO TABS
2.0000 | ORAL_TABLET | Freq: Every day | ORAL | Status: DC
  Administered 2021-03-07 – 2021-03-08 (×2): 2 via ORAL
  Filled 2021-03-06 (×2): qty 2

## 2021-03-06 MED ORDER — FLEET ENEMA 7-19 GM/118ML RE ENEM: 1.0000 | ENEMA | RECTAL | Status: DC | PRN

## 2021-03-06 MED ORDER — TETANUS-DIPHTH-ACELL PERTUSSIS 5-2.5-18.5 LF-MCG/0.5 IM SUSY: 0.5000 mL | PREFILLED_SYRINGE | Freq: Once | INTRAMUSCULAR | Status: DC

## 2021-03-06 MED ORDER — DIBUCAINE (PERIANAL) 1 % EX OINT: 1.0000 "application " | TOPICAL_OINTMENT | CUTANEOUS | Status: DC | PRN

## 2021-03-06 MED ORDER — ONDANSETRON HCL 4 MG/2ML IJ SOLN: 4.0000 mg | Freq: Four times a day (QID) | INTRAMUSCULAR | Status: DC | PRN

## 2021-03-06 MED ORDER — OXYCODONE-ACETAMINOPHEN 5-325 MG PO TABS: 2.0000 | ORAL_TABLET | ORAL | Status: DC | PRN

## 2021-03-06 NOTE — Plan of Care (Signed)
?  Problem: Education: ?Goal: Knowledge of General Education information will improve ?Description: Including pain rating scale, medication(s)/side effects and non-pharmacologic comfort measures ?Outcome: Completed/Met ?  ?Problem: Health Behavior/Discharge Planning: ?Goal: Ability to manage health-related needs will improve ?Outcome: Completed/Met ?  ?Problem: Clinical Measurements: ?Goal: Ability to maintain clinical measurements within normal limits will improve ?Outcome: Completed/Met ?Goal: Will remain free from infection ?Outcome: Completed/Met ?Goal: Diagnostic test results will improve ?Outcome: Completed/Met ?Goal: Respiratory complications will improve ?Outcome: Completed/Met ?Goal: Cardiovascular complication will be avoided ?Outcome: Completed/Met ?  ?Problem: Activity: ?Goal: Risk for activity intolerance will decrease ?Outcome: Completed/Met ?  ?Problem: Nutrition: ?Goal: Adequate nutrition will be maintained ?Outcome: Completed/Met ?  ?Problem: Coping: ?Goal: Level of anxiety will decrease ?Outcome: Completed/Met ?  ?Problem: Elimination: ?Goal: Will not experience complications related to bowel motility ?Outcome: Completed/Met ?Goal: Will not experience complications related to urinary retention ?Outcome: Completed/Met ?  ?Problem: Pain Managment: ?Goal: General experience of comfort will improve ?Outcome: Completed/Met ?  ?Problem: Safety: ?Goal: Ability to remain free from injury will improve ?Outcome: Completed/Met ?  ?Problem: Skin Integrity: ?Goal: Risk for impaired skin integrity will decrease ?Outcome: Completed/Met ?  ?Problem: Education: ?Goal: Knowledge of Childbirth will improve ?Outcome: Completed/Met ?Goal: Ability to make informed decisions regarding treatment and plan of care will improve ?Outcome: Completed/Met ?Goal: Ability to state and carry out methods to decrease the pain will improve ?Outcome: Completed/Met ?Goal: Individualized Educational Video(s) ?Outcome: Completed/Met ?   ?Problem: Coping: ?Goal: Ability to verbalize concerns and feelings about labor and delivery will improve ?Outcome: Completed/Met ?  ?Problem: Life Cycle: ?Goal: Ability to make normal progression through stages of labor will improve ?Outcome: Completed/Met ?Goal: Ability to effectively push during vaginal delivery will improve ?Outcome: Completed/Met ?  ?Problem: Role Relationship: ?Goal: Will demonstrate positive interactions with the child ?Outcome: Completed/Met ?  ?Problem: Safety: ?Goal: Risk of complications during labor and delivery will decrease ?Outcome: Completed/Met ?  ?Problem: Pain Management: ?Goal: Relief or control of pain from uterine contractions will improve ?Outcome: Completed/Met ?  ?

## 2021-03-06 NOTE — Progress Notes (Signed)
? ?  LABOR PROGRESS NOTE ? ?Patricia Warren is a 26 y.o. [phone] at [redacted]w[redacted]d admitted for induction of labor due to uncontrolled CHTN in the setting of active cocaine abuse.  ? ?Subjective: ?She reports frequent contractions with significant pain. Desires epidural. Patient denies any headaches, visual symptoms, RUQ/epigastric pain or other concerning symptoms. ? ?Objective: ?BP 137/77   Pulse 83   Temp 98 ?F (36.7 ?C) (Oral)   Resp 16   Ht 5\' 8"  (1.727 m)   Wt 97.5 kg   LMP 04/20/2020 (Approximate)   SpO2 100%   BMI 32.69 kg/m?  ?No intake/output data recorded. ?Total I/O ?In: 679.2 [I.V.:429.2; IV Piggyback:250] ?Out: -  ? ?FHT:  FHR: 135 bpm, variability: moderate,  accelerations:  10 x10  Present,  decelerations:  Absent ?UC:   irregular, every 3-6 minutes ?SVE:   Dilation: 5.5 ?Effacement (%): 80 ?Station: -1 ?Exam by:: Dr [email or web address] ? ?Labs: ?Lab Results  ?Component Value Date  ? WBC 12.0 (H) 03/06/2021  ? HGB 11.3 (L) 03/06/2021  ? HCT 33.2 (L) 03/06/2021  ? MCV 84.5 03/06/2021  ? PLT 212 03/06/2021  ?  ?CMP Latest Ref Rng & Units 03/06/2021 11/15/2020 01/25/2020  ?Glucose 70 - 99 mg/dL 97 94 94  ?BUN 6 - 20 mg/dL 9 5(L) 13  ?Creatinine 0.44 - 1.00 mg/dL 01/27/2020) 2.81(V 8.86  ?Sodium 135 - 145 mmol/L 136 134(L) 140  ?Potassium 3.5 - 5.1 mmol/L 3.6 3.7 4.0  ?Chloride 98 - 111 mmol/L 107 103 109  ?CO2 22 - 32 mmol/L 22 23 24   ?Calcium 8.9 - 10.3 mg/dL 7.73) ) 7.9(L)  ?Total Protein 6.5 - 8.1 g/dL 7.3(G) - 5.5(L)  ?Total Bilirubin 0.3 - 1.2 mg/dL 6.8(D) - 0.7  ?Alkaline Phos 38 - 126 U/L 151(H) - 48  ?AST 15 - 41 U/L 38 - 17  ?ALT 0 - 44 U/L 33 - 19  ? ? ?Assessment / Plan: ?Induction of labor due to poorly controlled CHTN ? ?Labor:  Patient offered AROM vs pitocin, she wants to get epidural first then AROM. Resistant to get pitocin.  ?Uncontrolled CHTN: Being treated with magnesium sulfate due to severe range BP for eclampsia prophylaxis. Has received Labetalol IV x 1.  Nifedipine XL 30 mg po daily started, will  titrate up as needed for BP control. Stable BP for now ?Fetal Wellbeing:  Category I, continue close monitoring. ?Pain Control:  Epidural to be obtained soon ?I/D:   Gettting PCN for GBS unknown and unsure dating ?Cocaine and THC use: TOC consulted ?Anticipated MOD:  Hopeful for vaginal delivery  ? ?5.9(E, MD ?03/06/2021, 10:04 AM ? ? ?

## 2021-03-06 NOTE — H&P (Addendum)
OB ADMISSION/ HISTORY & PHYSICAL: ? ?Admission Date: 03/06/2021  5:05 AM  ?Admit Diagnosis: Chronic hypertension affecting pregnancy [O10.919]   ? ?Hillari Zumwalt is a 26 y.o. female at [redacted]w[redacted]d presenting for contractions and elevated blood pressures. Patient denies headache, vision changes, and RUQ pain. Patient reports good fetal movement and denies LOF. Patient has late/limited prenatal care initiated at 29 weeks.  ? ?Prenatal History: ?[id number]   ?EDC: 03/25/2021, by Patient Reported  ?  ? ?Prenatal course complicated by: ?-No prenatal care ?-Chronic Hypertension- not taking meds  ?-Substance use during pregnancy: Marijuana, cocaine (reports use 2days ago) ? ?Prenatal Labs: ?Nursing Staff Provider  ?Office Location  [facility] Dating   29 week [place]  ?Language  English Anatomy US  normal  ?Flu Vaccine    Genetic Screen  NIPS:   ?Late PNC ?   ?TDaP vaccine    Hgb A1C or  ?GTT Early  ?Third trimester neg  ?Rhogam      LAB RESULTS   ?Feeding Plan Bottle Blood Type   O  pos ?   ?Contraception BTL Antibody  neg  ?Circumcision   Rubella  imm  ?Pediatrician    RPR   neg  ?Support Person [facility] HBsAg   neg  ?Prenatal Classes   HIV  neg  ?BTL Consent   GBS  (neg  ?VBAC Consent   Pap Neg 2019  ?    Hgb Electro     ?BP Cuff   CF    ?    SMA    ?    Waterbirth  [ ]  Class [ ]  Consent [ ]  CNM visit  ? ?All labs pending ?ABO, Rh: O positive     ?Antibody:  peniding ?RPR:   pending ?HBsAg: pending   ?    ?GBS: pending   ?1 hr Glucola : no PNC ? ?  ?Maternal Diabetes: No- admission HgbA1c normal ?Genetic Screening: no PNC ?Maternal Ultrasounds/Referrals: had 2 informal; one 1st trimester for [facility], then one at bedside in [facility] @ 76wks- agreed with original EDC ?Fetal Ultrasounds or other Referrals:  None ?Maternal Substance Abuse:  Yes:  Type: Marijuana, Cocaine ?Significant Maternal Medications:  None ? ?Medical / Surgical History : ?Past medical history:  ?Past Medical History:  ?Diagnosis Date  ? Chlamydia infection affecting  pregnancy in first trimester, antepartum   ? Hypertension   ? Mild obesity   ? Smoker   ? Trichimoniasis   ?  ?Past surgical history:  ?Past Surgical History:  ?Procedure Laterality Date  ? WISDOM TOOTH EXTRACTION    ? 2012  ?  ?Family History:  ?Family History  ?Problem Relation Age of Onset  ? Hypertension Mother   ? Asthma Brother   ?  ?Social History:  reports that she has been smoking cigarettes. She has been smoking an average of .5 packs per day. She has never used smokeless tobacco. She reports that she does not currently use alcohol after a past usage of about 1.0 standard drink per week. She reports that she does not use drugs. ?Allergies: ?Patient has no known allergies.  ? ?Current Medications at time of admission:  ?Medications Prior to Admission  ?Medication Sig Dispense Refill Last Dose  ? acetaminophen (TYLENOL) 325 MG tablet Take 2 tablets (650 mg total) by mouth every 4 (four) hours as needed (for pain scale < 4). 30 tablet 0   ? amLODipine (NORVASC) 5 MG tablet Take 1 tablet (5 mg total) by mouth daily. 30 tablet 5   ?  aspirin EC 81 MG tablet Take 1 tablet (81 mg total) by mouth daily. Take after 12 weeks for prevention of preeclampsia later in pregnancy (Patient not taking: Reported on 09/30/2018) 300 tablet 2   ? ibuprofen (ADVIL) 600 MG tablet Take 1 tablet (600 mg total) by mouth every 6 (six) hours. 30 tablet 0   ? medroxyPROGESTERone (PROVERA) 10 MG tablet Take 2 tablets (20 mg total) by mouth daily for 10 days. 20 tablet 0   ? naproxen sodium (ALEVE) 220 MG tablet Take 220 mg by mouth as needed (headache).     ? Prenatal Vit-Fe Fumarate-FA (PRENATAL VITAMINS) 28-0.8 MG TABS Take 28 mg by mouth daily with breakfast. 30 tablet 4   ?  ? ?Review of Systems: ?Review of Systems  ?Constitutional: Negative.   ?HENT: Negative.    ?Eyes: Negative.   ?Respiratory: Negative.    ?Cardiovascular: Negative.   ?Musculoskeletal: Negative.   ?Neurological: Negative.   ?Psychiatric/Behavioral:  Positive for  substance abuse (cocaine, THC).   ? ?Physical Exam: ?Vital signs and nursing notes reviewed. ? ?Patient Vitals for the past 24 hrs: ? BP Temp Temp src Pulse Resp SpO2 Height Weight  ?03/06/21 0619 (!) 151/87 -- -- 74 18 -- -- --  ?03/06/21 0600 137/83 -- -- 78 -- -- -- --  ?03/06/21 0555 (!) 163/113 -- -- 80 -- -- -- --  ?03/06/21 0536 (!) 174/95 -- -- 77 -- -- -- --  ?03/06/21 0525 -- -- -- -- -- 100 % -- --  ?03/06/21 0514 (!) 169/99 98 ?F (36.7 ?C) Oral 88 18 100 % -- --  ?03/06/21 0513 -- -- -- -- -- -- 5\' 8"  (1.727 m) 97.5 kg  ?03/06/21 0510 (!) 167/97 -- -- 84 -- 95 % -- --  ?03/06/21 0509 (!) 178/100 -- -- 84 -- -- -- --  ?  ? ?General: AAO x 3, NAD ?Heart: RRR ?Lungs:CTAB ?Abdomen: Gravid, NT ?Extremities: no edema ?Genitalia / VE: Dilation: 3.5 ?Effacement (%): 80 ?Cervical Position: Middle ?Station: -3 ?Presentation: Vertex ?Exam by:: [email or web address] NP  ? ?FHR: 135 BPM, moderate variability, present accels, absent decels ?TOCO: Irreg contractions  ? ?Labs:   ?Pending T&S, CBC, RPR  ?Recent Labs  ?  03/06/21 ?05/06/21  ?WBC 12.0*  ?HGB 11.3*  ?HCT 33.2*  ?PLT 212  ? ? ?Assessment: ? 26 y.o. 34 at [redacted]w[redacted]d IOL cHTN, severe range bps ? ?1. 1st stage of labor ?2. FHR category 1 ?3. GBS pending  ?4. Desires epidural ?5. Plans to formula feed ?6. UDS- + for THC and cocaine  ? ?Plan:  ?1. Admit to BS ?2. Routine L&D orders ?3. Analgesia/anesthesia PRN  ?4. Magnesium Sulfate- Severe range bps. Will continue to monitor. Pc ratio 0.16 and Pre-E labs normal  ?5. Anticipate NSVB ?6. Depo prior to discharge ?7. Social work consult- possible adoption  ? ? ? ? ?[redacted]w[redacted]d White,SNM ?03/06/2021, 7:35 AM  ? ?CNM attestation: ? ?I have seen and examined this patient; I agree with above documentation in the student nurse midwife's note.  ? ?Kaydee Magel is a 26 y.o. [phone] here for potential SOL v IOL due to uncontrolled cHTN with severe range BP values ? ?PE: ?BP (!) 151/84   Pulse 79   Temp 98 ?F (36.7 ?C) (Oral)   Resp 16    Ht 5\' 8"  (1.727 m)   Wt 97.5 kg   LMP 04/20/2020 (Approximate)   SpO2 100%   BMI 32.69 kg/m?  ?Gen: calm comfortable, NAD ?  Resp: normal effort, no distress ?Abd: gravid ? ?ROS, labs, PMH reviewed ? ?Plan: ?-Admit to L&D ?-Start mag sulfate for seizure ppx ?-PCN for pending GBS culture and potential preterm status ?-Manage expectantly to start to determine if she is laboring spontaneously; Pitocin if not ?-Anticipate vag delivery ?-Plan for SW consult postpartum ? ?Arabella Merles CNM ?03/06/2021, 8:41 AM  ?

## 2021-03-06 NOTE — MAU Note (Signed)
Patient presents to MAU with painful contractions, rating them a 9/10. She states started about two hours ago, and that they became more frequent and painful about an hour ago. She denies bloody show and ROM. She also states that she has HTN outside of pregnancy. ? ? ?

## 2021-03-06 NOTE — Anesthesia Procedure Notes (Signed)
Epidural ?Patient location during procedure: OB ?Start time: 03/06/2021 10:50 AM ?End time: 03/06/2021 10:54 AM ? ?Staffing ?Anesthesiologist: Lyn Hollingshead, MD ?Performed: anesthesiologist  ? ?Preanesthetic Checklist ?Completed: patient identified, IV checked, site marked, risks and benefits discussed, surgical consent, monitors and equipment checked, pre-op evaluation and timeout performed ? ?Epidural ?Patient position: sitting ?Prep: DuraPrep and site prepped and draped ?Patient monitoring: continuous pulse ox and blood pressure ?Approach: midline ?Location: L3-L4 ?Injection technique: LOR air ? ?Needle:  ?Needle type: Tuohy  ?Needle gauge: 17 G ?Needle length: 9 cm and 9 ?Needle insertion depth: 6 cm ?Catheter type: closed end flexible ?Catheter size: 19 Gauge ?Catheter at skin depth: 12 cm ?Test dose: negative and Other ? ?Assessment ?Events: blood not aspirated, injection not painful, no injection resistance, no paresthesia and negative IV test ? ?Additional Notes ?Reason for block:procedure for pain ? ? ? ?

## 2021-03-06 NOTE — Discharge Summary (Signed)
? ?  Postpartum Discharge Summary ? ?Date of Service updated-03/08/21 ? ?   ?Patient Name: Patricia Warren ?DOB: 1995-10-08 ?MRN: [id number] ? ?Date of admission: 03/06/2021 ?Delivery date:03/06/2021  ?Delivering provider: Vennie Homans  ?Date of discharge: 03/08/2021 ? ?Admitting diagnosis: Chronic hypertension affecting pregnancy [O10.919] ?Intrauterine pregnancy: [redacted]w[redacted]d    ?Secondary diagnosis:  Principal Problem: ?  Vaginal delivery ?Active Problems: ?  Chronic hypertension with superimposed pre-eclampsia ?  No prenatal care in current pregnancy ?  Substance use ? ?Additional problems: none    ?Discharge diagnosis: Term Pregnancy Delivered                                              ?Post partum procedures: none ?Augmentation: AROM and Pitocin ?Complications: None ? ?Hospital course: Onset of Labor With Vaginal Delivery      ?26y.o. yo [id number] admitted in Latent Labor on 03/06/2021. Patient had an uncomplicated labor course and progressed to complete after Pitocin and AROM. She had an uncomplicated vaginal delivery.  ?Membrane Rupture Time/Date: 4:19 PM ,03/06/2021   ?Delivery Method:Vaginal, Spontaneous  ?Episiotomy: None  ?Lacerations:  None  ?Patient had an uncomplicated postpartum course.  She was continued on magnesium for 24 hours after delivery.  Her blood pressures were controlled with Procardia 90 daily.  She was started on oral Lasix on PPD#1, and she will continue a 5 day course.  She is ambulating, tolerating a regular diet, passing flatus, and urinating well. Patient is discharged home in stable condition on 03/08/21. ? ?Newborn Data: ?Birth date:03/06/2021  ?Birth time:4:22 PM  ?Gender:Female  ?Living status:Living  ?Apgars:9 ,9  ?Weight:2720 g  ? ?Magnesium Sulfate received: Yes: Seizure prophylaxis ?BMZ received: No ?Rhophylac: N/A ?MMR: immune ?T-DaP: Offered postpartum  ?Flu: No ?Transfusion: No  ? ?Physical exam  ?Vitals:  ? 03/07/21 2345 03/08/21 0505 03/08/21 0818 03/08/21 0831  ?BP: (!)  153/98 (!) 143/79 (!) 171/89 (!) 146/82  ?Pulse: 92 79 81 78  ?Resp: '16 16 17   ' ?Temp: 98.1 ?F (36.7 ?C) 98.4 ?F (36.9 ?C) 98.6 ?F (37 ?C)   ?TempSrc: Oral Oral Oral   ?SpO2: 100% 100% 100%   ?Weight:      ?Height:      ? ?General: alert, cooperative, and no distress ?CV: RRR ?Lungs: CTAB ?Lochia: appropriate ?Uterine Fundus: firm ?Incision: N/A ?DVT Evaluation: No evidence of DVT seen on physical exam. ? ?Labs: ?Lab Results  ?Component Value Date  ? WBC 11.0 (H) 03/07/2021  ? HGB 10.3 (L) 03/07/2021  ? HCT 30.9 (L) 03/07/2021  ? MCV 84.4 03/07/2021  ? PLT 189 03/07/2021  ? ?CMP Latest Ref Rng & Units 03/07/2021  ?Glucose 70 - 99 mg/dL 86  ?BUN 6 - 20 mg/dL <5(L)  ?Creatinine 0.44 - 1.00 mg/dL 0.50  ?Sodium 135 - 145 mmol/L 137  ?Potassium 3.5 - 5.1 mmol/L 3.6  ?Chloride 98 - 111 mmol/L 112(H)  ?CO2 22 - 32 mmol/L 22  ?Calcium 8.9 - 10.3 mg/dL 6.5(L)  ?Total Protein 6.5 - 8.1 g/dL 4.8(L)  ?Total Bilirubin 0.3 - 1.2 mg/dL 0.2(L)  ?Alkaline Phos 38 - 126 U/L 140(H)  ?AST 15 - 41 U/L 27  ?ALT 0 - 44 U/L 27  ? ?Edinburgh Score: ?Edinburgh Postnatal Depression Scale Screening Tool 10/19/2018  ?I have been able to laugh and see the funny side of things.  1  ?I have looked forward with enjoyment to things. 1  ?I have blamed myself unnecessarily when things went wrong. 2  ?I have been anxious or worried for no good reason. 2  ?I have felt scared or panicky for no good reason. 1  ?Things have been getting on top of me. 2  ?I have been so unhappy that I have had difficulty sleeping. 1  ?I have felt sad or miserable. 1  ?I have been so unhappy that I have been crying. 1  ?The thought of harming myself has occurred to me. 0  ?Edinburgh Postnatal Depression Scale Total 12  ? ? ? ?After visit meds:  ?Allergies as of 03/08/2021   ?No Known Allergies ?  ? ?  ?Medication List  ?  ? ?STOP taking these medications   ? ?amLODipine 5 MG tablet ?Commonly known as: NORVASC ?  ?aspirin EC 81 MG tablet ?  ?medroxyPROGESTERone 10 MG  tablet ?Commonly known as: Provera ?  ?naproxen sodium 220 MG tablet ?Commonly known as: ALEVE ?  ? ?  ? ?TAKE these medications   ? ?acetaminophen 325 MG tablet ?Commonly known as: Tylenol ?Take 2 tablets (650 mg total) by mouth every 4 (four) hours as needed (for pain scale < 4). ?  ?furosemide 20 MG tablet ?Commonly known as: LASIX ?Take 1 tablet (20 mg total) by mouth 2 (two) times daily for 3 days. ?  ?ibuprofen 600 MG tablet ?Commonly known as: ADVIL ?Take 1 tablet (600 mg total) by mouth every 6 (six) hours as needed for moderate pain or mild pain. ?What changed:  ?when to take this ?reasons to take this ?  ?NIFEdipine 90 MG 24 hr tablet ?Commonly known as: PROCARDIA XL/NIFEDICAL-XL ?Take 1 tablet (90 mg total) by mouth daily. ?Start taking on: March 09, 2021 ?  ?Prenatal Vitamins 28-0.8 MG Tabs ?Take 28 mg by mouth daily with breakfast. ?  ? ?  ? ? ? ?Discharge home in stable condition ?Infant Feeding: Bottle ?Infant Disposition: home with mother ?Discharge instruction: per After Visit Summary and Postpartum booklet. ?Activity: Advance as tolerated. Pelvic rest for 6 weeks.  ?Diet: routine diet ?Future Appointments: ?Future Appointments  ?Date Time Provider [place]  ?03/14/2021 10:00 AM [facility] NURSE [facility]  ?04/18/2021 10:15 AM Ainsley Spinner Dara Lords, CNM [facility]  ? ?Follow up Visit: ? Follow-up Information   ? ? [facility] Follow up.   ?Specialty: Obstetrics and Gynecology ?Why: An appointment has been scheduled for you on 3/10.  If you desire a different location, please call the office to change your appointment. ?Contact information: ?[address] ?[place] [id number] ?[phone] ? ?  ?  ? ?  ?  ? ?  ? ?No prenatal care. Message sent to [facility] by Dr. Gwenlyn Perking on 03/06/21. ? ?Please schedule this patient for a In person postpartum visit in 6 weeks with the following provider: Any provider. ?Additional Postpartum F/U:  BP check 1 week  ?High risk pregnancy complicated by:  cHTN with superimposed severe pre-eclampsia ?Delivery mode:  Vaginal, Spontaneous  ?Anticipated Birth Control:   desires non-hormonal option- to review during her postpartum visit ? ?03/08/2021 ?Annalee Genta, DO ? ? ? ?

## 2021-03-06 NOTE — Anesthesia Preprocedure Evaluation (Signed)
Anesthesia Evaluation  ?Patient identified by MRN, date of birth, ID band ?Patient awake ? ? ? ?Reviewed: ?Allergy & Precautions, H&P , NPO status , Patient's Chart, lab work & pertinent test results ? ?Airway ?Mallampati: I ? ?TM Distance: >3 FB ?Neck ROM: full ? ? ? Dental ?no notable dental hx. ? ?  ?Pulmonary ?Current Smoker,  ?  ?Pulmonary exam normal ? ? ? ? ? ? ? Cardiovascular ?hypertension, Pt. on medications ?Normal cardiovascular exam ? ? ?  ?Neuro/Psych ?negative neurological ROS ? negative psych ROS  ? GI/Hepatic ?negative GI ROS, (+)  ?  ? substance abuse ? cocaine use and marijuana use,   ?Endo/Other  ?negative endocrine ROS ? Renal/GU ?negative Renal ROS  ?negative genitourinary ?  ?Musculoskeletal ?negative musculoskeletal ROS ?(+)  ? Abdominal ?(+) + obese,   ?Peds ? Hematology ? ?(+) Blood dyscrasia, anemia ,   ?Anesthesia Other Findings ? ? Reproductive/Obstetrics ?(+) Pregnancy ? ?  ? ? ? ? ? ? ? ? ? ? ? ? ? ?  ?  ? ? ? ? ? ? ? ? ?Anesthesia Physical ?Anesthesia Plan ? ?ASA: 2 ? ?Anesthesia Plan: Epidural  ? ?Post-op Pain Management:   ? ?Induction:  ? ?PONV Risk Score and Plan:  ? ?Airway Management Planned:  ? ?Additional Equipment:  ? ?Intra-op Plan:  ? ?Post-operative Plan:  ? ?Informed Consent: I have reviewed the patients History and Physical, chart, labs and discussed the procedure including the risks, benefits and alternatives for the proposed anesthesia with the patient or authorized representative who has indicated his/her understanding and acceptance.  ? ? ? ? ? ?Plan Discussed with:  ? ?Anesthesia Plan Comments:   ? ? ? ? ? ? ?Anesthesia Quick Evaluation ? ?

## 2021-03-06 NOTE — Progress Notes (Addendum)
Labor Progress Note ?Patricia Warren is a 26 y.o. [phone] at [redacted]w[redacted]d presented for IOL due to cHTN with SIPE with severe features.  ? ?S: Resting comfortably in bed. Reporting no pain with epidural. ? ?O:  ?BP (!) 145/78   Pulse 80   Temp (!) 96.9 ?F (36.1 ?C) (Axillary)   Resp 18   Ht 5\' 8"  (1.727 m)   Wt 97.5 kg   LMP 04/20/2020 (Approximate)   SpO2 100%   BMI 32.69 kg/m?  ? ?EFM: Baseline FHR 130 bpm/moderate variability/+accels, no decels ? ?CVE: Dilation: 5.5 ?Effacement (%): 80 ?Cervical Position: Posterior ?Station: -1, -2 ?Presentation: Vertex ?Exam by:: Dr [email or web address] ? ? ?A&P: 26 y.o. 34 [redacted]w[redacted]d  ? ?#Labor: Attempted AROM, thin membrane intact without bulging bag. Suspect high leak. Contracting infrequently. Will plan to start Pitocin 2x2 for labor augmentation and reassess for AROM if forebag remains on subsequent check. Will reassess in 3-4 hours. Anticipate SVD. ?#Pain: Epidural ?#FWB: Cat 1 ?#GBS: Unknown and unsure dating, culture pending. PCN given. ? ?#cHTN with SIPE w/SF: Mild range BP at this time. Remains asymptomatic. On Mag. Continue Procardia XL 30 mg, will titrate up as needed.  ? ?[redacted]w[redacted]d, DO PGY-1 ?03/06/2021, 1:41 PM ? ?GME ATTESTATION:  ?I saw and evaluated the patient. I agree with the findings and the plan of care as documented in the resident?s note. I have made changes to documentation as necessary. ? ?05/06/2021, MD ?OB Fellow, Faculty Practice ?[place], [facility] for [facility] ?03/06/2021 3:11 PM ? ?

## 2021-03-07 LAB — CBC
HCT: 30.9 % — ABNORMAL LOW (ref 36.0–46.0)
Hemoglobin: 10.3 g/dL — ABNORMAL LOW (ref 12.0–15.0)
MCH: 28.1 pg (ref 26.0–34.0)
MCHC: 33.3 g/dL (ref 30.0–36.0)
MCV: 84.4 fL (ref 80.0–100.0)
Platelets: 189 10*3/uL (ref 150–400)
RBC: 3.66 MIL/uL — ABNORMAL LOW (ref 3.87–5.11)
RDW: 13.8 % (ref 11.5–15.5)
WBC: 11 10*3/uL — ABNORMAL HIGH (ref 4.0–10.5)
nRBC: 0 % (ref 0.0–0.2)

## 2021-03-07 LAB — COMPREHENSIVE METABOLIC PANEL
ALT: 27 U/L (ref 0–44)
AST: 27 U/L (ref 15–41)
Albumin: 2.1 g/dL — ABNORMAL LOW (ref 3.5–5.0)
Alkaline Phosphatase: 140 U/L — ABNORMAL HIGH (ref 38–126)
Anion gap: 3 — ABNORMAL LOW (ref 5–15)
BUN: <5 mg/dL — ABNORMAL LOW (ref 6–20)
CO2: 22 mmol/L (ref 22–32)
Calcium: 6.5 mg/dL — ABNORMAL LOW (ref 8.9–10.3)
Chloride: 112 mmol/L — ABNORMAL HIGH (ref 98–111)
Creatinine, Ser: 0.5 mg/dL (ref 0.44–1.00)
GFR, Estimated: >60 mL/min (ref >60)
Glucose, Bld: 86 mg/dL (ref 70–99)
Potassium: 3.6 mmol/L (ref 3.5–5.1)
Sodium: 137 mmol/L (ref 135–145)
Total Bilirubin: 0.2 mg/dL — ABNORMAL LOW (ref 0.3–1.2)
Total Protein: 4.8 g/dL — ABNORMAL LOW (ref 6.5–8.1)

## 2021-03-07 LAB — GC/CHLAMYDIA PROBE AMP (~~LOC~~) NOT AT ARMC
Chlamydia: NEGATIVE
Comment: NEGATIVE
Comment: NORMAL
Neisseria Gonorrhea: NEGATIVE

## 2021-03-07 LAB — MAGNESIUM: Magnesium: 4.1 mg/dL — ABNORMAL HIGH (ref 1.7–2.4)

## 2021-03-07 LAB — CULTURE, OB URINE: Special Requests: NORMAL

## 2021-03-07 MED ORDER — OXYCODONE HCL 5 MG PO TABS
10.0000 mg | ORAL_TABLET | Freq: Four times a day (QID) | ORAL | Status: DC | PRN
  Administered 2021-03-07: 10 mg via ORAL
  Filled 2021-03-07: qty 2

## 2021-03-07 MED ORDER — NIFEDIPINE ER OSMOTIC RELEASE 60 MG PO TB24
90.0000 mg | ORAL_TABLET | Freq: Every day | ORAL | Status: DC
  Administered 2021-03-08: 90 mg via ORAL
  Filled 2021-03-07: qty 1

## 2021-03-07 MED ORDER — NIFEDIPINE 10 MG PO CAPS
30.0000 mg | ORAL_CAPSULE | Freq: Once | ORAL | Status: AC
  Administered 2021-03-07: 30 mg via ORAL
  Filled 2021-03-07: qty 3

## 2021-03-07 MED ORDER — NIFEDIPINE ER OSMOTIC RELEASE 60 MG PO TB24
60.0000 mg | ORAL_TABLET | Freq: Every day | ORAL | Status: DC
  Administered 2021-03-07: 60 mg via ORAL
  Filled 2021-03-07: qty 1

## 2021-03-07 NOTE — Progress Notes (Signed)
POSTPARTUM PROGRESS NOTE ? ?PPD #1 ? ?Subjective: ? ?Patricia Warren a 26 y.o. y.o. [id number] PPD#1 s/p VD at [redacted]w[redacted]d after IOL for severe preeclampsia superimposed on uncontrolled CHTN, in the setting of cocaine use.  Today she notes feeling tired, desires breakfast.  No nausea currently.  Pain is well controlled.  Lochia minimal Denies fever/chills/chest pain/SOB.  No HA, no blurry vision, no RUQ pain ? ?Objective: ?Blood pressure 140/75, pulse 83, temperature 98.5 ?F (36.9 ?C), temperature source Oral, resp. rate 18, height 5\' 8"  (1.727 m), weight 97.5 kg, last menstrual period 04/20/2020, SpO2 99 %, unknown if currently breastfeeding. ? ?Patient Vitals for the past 24 hrs: ? BP Temp Temp src Pulse Resp SpO2  ?03/07/21 0600 -- -- -- -- 18 99 %  ?03/07/21 0500 -- -- -- -- 18 99 %  ?03/07/21 0401 140/75 98.5 ?F (36.9 ?C) Oral 83 18 100 %  ?03/07/21 0300 -- -- -- -- 18 99 %  ?03/07/21 0200 -- -- -- -- 18 99 %  ?03/07/21 0100 -- -- -- -- 18 99 %  ?03/06/21 2354 (!) 155/75 98.3 ?F (36.8 ?C) Oral 88 18 98 %  ?03/06/21 2300 -- -- -- -- 18 98 %  ?03/06/21 2200 -- -- -- -- 18 99 %  ?03/06/21 2100 -- -- -- -- 18 100 %  ?03/06/21 2029 (!) 141/70 -- -- 95 18 100 %  ?03/06/21 1930 (!) 141/84 98.3 ?F (36.8 ?C) Oral 79 18 100 %  ?03/06/21 1920 (!) 143/84 -- -- 90 -- 100 %  ?03/06/21 1917 (!) 141/82 -- -- 85 -- --  ?03/06/21 1857 (!) 169/94 -- -- 76 -- 100 %  ?03/06/21 1842 (!) 165/97 98.6 ?F (37 ?C) Oral 76 17 100 %  ?03/06/21 1817 (!) 149/94 -- -- 83 18 --  ?03/06/21 1748 (!) 160/95 -- -- 69 -- --  ?03/06/21 1733 (!) 151/93 -- -- 69 16 --  ?03/06/21 1717 (!) 164/90 -- -- 77 18 --  ?03/06/21 1702 (!) 145/90 -- -- 76 18 --  ?03/06/21 1602 138/72 (!) 97.5 ?F (36.4 ?C) Oral 72 18 --  ?03/06/21 1537 -- -- -- -- 16 --  ?03/06/21 1532 (!) 149/86 -- -- 73 -- --  ?03/06/21 1506 -- -- -- -- 18 --  ?03/06/21 1501 134/74 -- -- 78 -- --  ?03/06/21 1431 (!) 146/79 -- -- 77 18 --  ?03/06/21 1406 (!) 144/78 -- -- 75 18 --  ?03/06/21 1333 -- -- --  -- 18 --  ?03/06/21 1332 127/70 -- -- 87 -- --  ?03/06/21 1301 (!) 145/78 -- -- 80 18 --  ?03/06/21 1232 (!) 150/73 -- -- 72 18 --  ?03/06/21 1202 (!) 146/74 -- -- 80 -- --  ?03/06/21 1156 -- (!) 96.9 ?F (36.1 ?C) Axillary -- -- --  ?03/06/21 1131 (!) 160/91 -- -- 93 18 --  ?03/06/21 1126 (!) 159/88 -- -- 76 -- --  ?03/06/21 1121 (!) 151/88 -- -- 73 -- --  ?03/06/21 1117 (!) 159/85 -- -- 72 -- --  ?03/06/21 1112 (!) 160/87 -- -- 79 -- --  ?03/06/21 1106 (!) 162/125 -- -- 84 -- --  ?03/06/21 1102 134/87 -- -- 83 -- --  ?03/06/21 1053 (!) 168/91 -- -- 87 -- --  ?03/06/21 1001 137/77 -- -- 83 16 --  ?03/06/21 0931 (!) 146/85 -- -- 79 18 --  ?03/06/21 0901 (!) 153/87 -- -- 78 16 --  ?03/06/21 0831 (!) 151/84 -- -- 79 16 --  ?  03/06/21 0801 125/74 -- -- 76 16 --  ?03/06/21 0732 126/82 -- -- 84 16 --  ?03/06/21 0722 -- -- -- -- 16 --  ?03/06/21 0700 (!) 147/98 -- -- 79 -- --  ? ? ?Intake/Output Summary (Last 24 hours) at 03/07/2021 0648 ?Last data filed at 03/07/2021 5852 ?Gross per 24 hour  ?Intake 6587.12 ml  ?Output 6700 ml  ?Net -112.88 ml  ? ? ?Physical Exam:  ?General: alert, cooperative and no distress ?Chest: no respiratory distress, CTAB ?Heart: regular rate and rhythm ?Abdomen: soft, nontender, BS quiet ?Uterine Fundus: firm, non- tender ?DVT Evaluation: No calf swelling or tenderness, SCDs in place ?Extremities: 1+ edema ?Skin: warm, dry ? ?Assessment/Plan: ?Patricia Warren is a  26 y.o. y.o. [id number] PPD#1 s/p VD at [redacted]w[redacted]d after IOL for severe preeclampsia superimposed on uncontrolled CHTN ? ?1) Preeclampsia with severe features on CHTN ?-currently on Magnesium until later this evening ?-Procardia XL increased to 60mg  daily, continue Lasix 20mg  bid ?- Monitor BP as needed and titrate regimen as needed ?  ?2) Cocaine use ?- SW consulted, will follow up recommendations ? ?3) Postpartum care ?-pain well controlled ?-encourage ambulation ?  ? ?Contraception: unsure ?Feeding: bottle ?  ?Dispo: Continue with postpartum care  as outlined above ? ? LOS: 1 day  ? ? , MD, FACOG ?Obstetrician , Faculty Practice ?[facility], [facility] ? ? ?

## 2021-03-07 NOTE — Plan of Care (Signed)
?  Problem: Education: ?Goal: Knowledge of General Education information will improve ?Description: Including pain rating scale, medication(s)/side effects and non-pharmacologic comfort measures ?Outcome: Completed/Met ?  ?Problem: Health Behavior/Discharge Planning: ?Goal: Ability to manage health-related needs will improve ?Outcome: Completed/Met ?  ?Problem: Clinical Measurements: ?Goal: Ability to maintain clinical measurements within normal limits will improve ?Outcome: Completed/Met ?Goal: Will remain free from infection ?Outcome: Completed/Met ?Goal: Diagnostic test results will improve ?Outcome: Completed/Met ?Goal: Respiratory complications will improve ?Outcome: Completed/Met ?Goal: Cardiovascular complication will be avoided ?Outcome: Completed/Met ?  ?Problem: Activity: ?Goal: Risk for activity intolerance will decrease ?Outcome: Completed/Met ?  ?Problem: Nutrition: ?Goal: Adequate nutrition will be maintained ?Outcome: Completed/Met ?  ?Problem: Coping: ?Goal: Level of anxiety will decrease ?Outcome: Completed/Met ?  ?Problem: Elimination: ?Goal: Will not experience complications related to bowel motility ?Outcome: Completed/Met ?Goal: Will not experience complications related to urinary retention ?Outcome: Completed/Met ?  ?Problem: Pain Managment: ?Goal: General experience of comfort will improve ?Outcome: Completed/Met ?  ?Problem: Safety: ?Goal: Ability to remain free from injury will improve ?Outcome: Completed/Met ?  ?Problem: Skin Integrity: ?Goal: Risk for impaired skin integrity will decrease ?Outcome: Completed/Met ?  ?Problem: Education: ?Goal: Knowledge of Childbirth will improve ?Outcome: Completed/Met ?Goal: Ability to make informed decisions regarding treatment and plan of care will improve ?Outcome: Completed/Met ?Goal: Ability to state and carry out methods to decrease the pain will improve ?Outcome: Completed/Met ?Goal: Individualized Educational Video(s) ?Outcome: Completed/Met ?   ?Problem: Coping: ?Goal: Ability to verbalize concerns and feelings about labor and delivery will improve ?Outcome: Completed/Met ?  ?Problem: Life Cycle: ?Goal: Ability to make normal progression through stages of labor will improve ?Outcome: Completed/Met ?Goal: Ability to effectively push during vaginal delivery will improve ?Outcome: Completed/Met ?  ?Problem: Role Relationship: ?Goal: Will demonstrate positive interactions with the child ?Outcome: Completed/Met ?  ?Problem: Safety: ?Goal: Risk of complications during labor and delivery will decrease ?Outcome: Completed/Met ?  ?Problem: Pain Management: ?Goal: Relief or control of pain from uterine contractions will improve ?Outcome: Completed/Met ?  ?Problem: Activity: ?Goal: Will verbalize the importance of balancing activity with adequate rest periods ?Outcome: Completed/Met ?  ?Problem: Coping: ?Goal: Ability to identify and utilize available resources and services will improve ?Outcome: Completed/Met ?  ?Problem: Role Relationship: ?Goal: Ability to demonstrate positive interaction with newborn will improve ?Outcome: Completed/Met ?  ?Problem: Education: ?Goal: Knowledge of General Education information will improve ?Description: Including pain rating scale, medication(s)/side effects and non-pharmacologic comfort measures ?Outcome: Completed/Met ?  ?Problem: Activity: ?Goal: Risk for activity intolerance will decrease ?Outcome: Completed/Met ?  ?Problem: Nutrition: ?Goal: Adequate nutrition will be maintained ?Outcome: Completed/Met ?  ?Problem: Coping: ?Goal: Level of anxiety will decrease ?Outcome: Completed/Met ?  ?Problem: Elimination: ?Goal: Will not experience complications related to urinary retention ?Outcome: Completed/Met ?  ?

## 2021-03-07 NOTE — Social Work (Addendum)
CSW acknowledges social work consult. CSW will complete assessment with MOB once magnesium medication is discontinued.   ? ?CSW made CPS report to [place] DSS.  ? ?Barriers to discharge. ? ?Nicholaus Steinke, LCSWA ?Clinical Social Work ?[facility] ?[phone] ? ?

## 2021-03-07 NOTE — Anesthesia Postprocedure Evaluation (Signed)
Anesthesia Post Note ? ?Patient: Orchid Glassberg ? ?Procedure(s) Performed: AN AD HOC LABOR EPIDURAL ? ?  ? ?Patient location during evaluation: OB High Risk ?Anesthesia Type: Epidural ?Level of consciousness: awake, oriented and awake and alert ?Pain management: pain level not controlled (Patient reports back pain. RN to add tylenol in addition to the ibuprophen and patient aware heat to back can help.) ?Vital Signs Assessment: post-procedure vital signs reviewed and stable ?Respiratory status: spontaneous breathing, respiratory function stable and nonlabored ventilation ?Cardiovascular status: stable ?Postop Assessment: adequate PO intake, no headache, patient able to bend at knees, able to ambulate and no apparent nausea or vomiting ?Anesthetic complications: no ? ? ?No notable events documented. ? ?Last Vitals:  ?Vitals:  ? 03/07/21 0755 03/07/21 0756  ?BP:  (!) 150/92  ?Pulse:  92  ?Resp:  18  ?Temp:  36.7 ?C  ?SpO2: 100% 100%  ?  ?Last Pain:  ?Vitals:  ? 03/07/21 0900  ?TempSrc:   ?PainSc: 9   ? ?Pain Goal: Patients Stated Pain Goal: 3 (03/06/21 1939) ? ?  ?  ?  ?  ?  ?  ?  ? ?[place] ? ? ? ? ?

## 2021-03-08 LAB — CULTURE, BETA STREP (GROUP B ONLY)

## 2021-03-08 LAB — RUBELLA SCREEN: Rubella: 1.99 index (ref >0.99)

## 2021-03-08 MED ORDER — ACETAMINOPHEN 325 MG PO TABS: 650.0000 mg | ORAL_TABLET | ORAL | Status: AC | PRN

## 2021-03-08 MED ORDER — IBUPROFEN 600 MG PO TABS: 600.0000 mg | ORAL_TABLET | Freq: Four times a day (QID) | ORAL | 0 refills | Status: AC | PRN

## 2021-03-08 MED ORDER — NIFEDIPINE ER OSMOTIC RELEASE 90 MG PO TB24: 90.0000 mg | ORAL_TABLET | Freq: Every day | ORAL | 0 refills | Status: AC

## 2021-03-08 MED ORDER — FUROSEMIDE 20 MG PO TABS: 20.0000 mg | ORAL_TABLET | Freq: Two times a day (BID) | ORAL | 0 refills | Status: AC
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
## 2021-03-09 ENCOUNTER — Ambulatory Visit: Payer: Self-pay

## 2021-03-09 NOTE — Lactation Note (Signed)
This note was copied from a baby's chart. ?Lactation Consultation Note ? ?Patient Name: Patricia Warren ?Today's Date: 03/09/2021 ?  ?Age:28 hours ? ?I spoke with Andree Elk, RN to discuss infant's bottle feeding. RN reports that infant has been using the Enfamil slow flow nipple (not extra slow-flow, as charted). In case infant has been limiting his volumes due to perceived fast flow, I sent RN an Nfant Standard nipple (equivalent to our extra slow-flow nipples) to see if infant can increase his volumes.  ? ?Remigio Eisenmenger ?03/09/2021, 7:49 AM ? ? ? ?

## 2021-03-12 ENCOUNTER — Telehealth (HOSPITAL_COMMUNITY): Payer: Self-pay | Admitting: *Deleted

## 2021-03-12 DIAGNOSIS — Z1331 Encounter for screening for depression: Secondary | ICD-10-CM

## 2021-03-12 NOTE — Telephone Encounter (Signed)
Patient referred for Ambulatory [facility] from in-patient social work consult. EPDS = 12. Dr. Shonna Chock notified via chart. ? ?Duffy Rhody, RN 03-12-2021 8:35am ?

## 2021-03-14 ENCOUNTER — Ambulatory Visit: Payer: Self-pay

## 2021-04-18 ENCOUNTER — Ambulatory Visit: Payer: Self-pay | Admitting: Family

## 2021-06-05 ENCOUNTER — Telehealth: Payer: Self-pay | Admitting: Clinical

## 2021-06-05 NOTE — Telephone Encounter (Signed)
Attempt to call regarding referral; Unable to leave message as "call cannot be completed at this time".
